# Patient Record
Sex: Female | Born: 1960
Health system: Southern US, Community
[De-identification: ages and names within clinical notes are randomized; demographics above are authoritative.]

## PROBLEM LIST (undated history)

## (undated) DIAGNOSIS — Z923 Personal history of irradiation: Secondary | ICD-10-CM

## (undated) DIAGNOSIS — G4730 Sleep apnea, unspecified: Secondary | ICD-10-CM

## (undated) DIAGNOSIS — D573 Sickle-cell trait: Secondary | ICD-10-CM

## (undated) DIAGNOSIS — C50919 Malignant neoplasm of unspecified site of unspecified female breast: Secondary | ICD-10-CM

## (undated) DIAGNOSIS — E785 Hyperlipidemia, unspecified: Secondary | ICD-10-CM

## (undated) DIAGNOSIS — G43909 Migraine, unspecified, not intractable, without status migrainosus: Secondary | ICD-10-CM

## (undated) DIAGNOSIS — I10 Essential (primary) hypertension: Secondary | ICD-10-CM

## (undated) DIAGNOSIS — M169 Osteoarthritis of hip, unspecified: Secondary | ICD-10-CM

## (undated) DIAGNOSIS — M810 Age-related osteoporosis without current pathological fracture: Secondary | ICD-10-CM

## (undated) DIAGNOSIS — J309 Allergic rhinitis, unspecified: Secondary | ICD-10-CM

## (undated) DIAGNOSIS — E119 Type 2 diabetes mellitus without complications: Secondary | ICD-10-CM

## (undated) DIAGNOSIS — I209 Angina pectoris, unspecified: Secondary | ICD-10-CM

## (undated) DIAGNOSIS — K219 Gastro-esophageal reflux disease without esophagitis: Secondary | ICD-10-CM

## (undated) DIAGNOSIS — D510 Vitamin B12 deficiency anemia due to intrinsic factor deficiency: Secondary | ICD-10-CM

## (undated) DIAGNOSIS — G8929 Other chronic pain: Secondary | ICD-10-CM

## (undated) DIAGNOSIS — I639 Cerebral infarction, unspecified: Secondary | ICD-10-CM

## (undated) DIAGNOSIS — M542 Cervicalgia: Secondary | ICD-10-CM

## (undated) DIAGNOSIS — I219 Acute myocardial infarction, unspecified: Secondary | ICD-10-CM

## (undated) DIAGNOSIS — Z973 Presence of spectacles and contact lenses: Secondary | ICD-10-CM

## (undated) DIAGNOSIS — G709 Myoneural disorder, unspecified: Secondary | ICD-10-CM

## (undated) DIAGNOSIS — Z720 Tobacco use: Secondary | ICD-10-CM

## (undated) DIAGNOSIS — E039 Hypothyroidism, unspecified: Secondary | ICD-10-CM

## (undated) HISTORY — DX: Tobacco use: Z72.0

## (undated) HISTORY — DX: Age-related osteoporosis without current pathological fracture: M81.0

## (undated) HISTORY — DX: Allergic rhinitis, unspecified: J30.9

## (undated) HISTORY — PX: COLONOSCOPY: SHX174

## (undated) HISTORY — DX: Personal history of irradiation: Z92.3

## (undated) HISTORY — DX: Cerebral infarction, unspecified: I63.9

## (undated) HISTORY — DX: Type 2 diabetes mellitus without complications: E11.9

## (undated) HISTORY — DX: Malignant neoplasm of unspecified site of unspecified female breast: C50.919

## (undated) HISTORY — DX: Hyperlipidemia, unspecified: E78.5

## (undated) HISTORY — DX: Essential (primary) hypertension: I10

## (undated) HISTORY — DX: Acute myocardial infarction, unspecified: I21.9

---

## 1988-09-01 HISTORY — PX: NASAL SINUS SURGERY: SHX719

## 1999-05-23 ENCOUNTER — Other Ambulatory Visit: Admission: RE | Admit: 1999-05-23 | Discharge: 1999-05-23 | Payer: Self-pay | Admitting: Otolaryngology

## 1999-05-23 ENCOUNTER — Encounter (INDEPENDENT_AMBULATORY_CARE_PROVIDER_SITE_OTHER): Payer: Self-pay | Admitting: Specialist

## 1999-09-02 DIAGNOSIS — I209 Angina pectoris, unspecified: Secondary | ICD-10-CM

## 1999-09-02 DIAGNOSIS — I219 Acute myocardial infarction, unspecified: Secondary | ICD-10-CM

## 1999-09-02 HISTORY — DX: Angina pectoris, unspecified: I20.9

## 1999-09-02 HISTORY — DX: Acute myocardial infarction, unspecified: I21.9

## 1999-09-02 HISTORY — PX: CORONARY ANGIOPLASTY WITH STENT PLACEMENT: SHX49

## 2000-01-16 ENCOUNTER — Other Ambulatory Visit: Admission: RE | Admit: 2000-01-16 | Discharge: 2000-01-16 | Payer: Self-pay | Admitting: Family Medicine

## 2000-01-16 ENCOUNTER — Encounter: Payer: Self-pay | Admitting: Emergency Medicine

## 2000-01-17 ENCOUNTER — Inpatient Hospital Stay (HOSPITAL_COMMUNITY): Admission: EM | Admit: 2000-01-17 | Discharge: 2000-01-19 | Payer: Self-pay | Admitting: Emergency Medicine

## 2000-02-04 ENCOUNTER — Encounter (HOSPITAL_COMMUNITY): Admission: RE | Admit: 2000-02-04 | Discharge: 2000-02-21 | Payer: Self-pay | Admitting: Cardiology

## 2000-03-09 ENCOUNTER — Encounter (HOSPITAL_COMMUNITY): Admission: RE | Admit: 2000-03-09 | Discharge: 2000-06-07 | Payer: Self-pay | Admitting: Cardiology

## 2002-05-26 ENCOUNTER — Encounter: Payer: Self-pay | Admitting: Family Medicine

## 2002-05-26 ENCOUNTER — Ambulatory Visit (HOSPITAL_COMMUNITY): Admission: RE | Admit: 2002-05-26 | Discharge: 2002-05-26 | Payer: Self-pay | Admitting: Family Medicine

## 2003-03-01 ENCOUNTER — Encounter: Payer: Self-pay | Admitting: Cardiology

## 2003-03-01 ENCOUNTER — Encounter: Payer: Self-pay | Admitting: Emergency Medicine

## 2003-03-02 ENCOUNTER — Inpatient Hospital Stay (HOSPITAL_COMMUNITY): Admission: EM | Admit: 2003-03-02 | Discharge: 2003-03-02 | Payer: Self-pay | Admitting: Emergency Medicine

## 2003-03-29 ENCOUNTER — Encounter: Payer: Self-pay | Admitting: Diagnostic Radiology

## 2003-03-29 ENCOUNTER — Encounter: Admission: RE | Admit: 2003-03-29 | Discharge: 2003-03-29 | Payer: Self-pay | Admitting: Orthopedic Surgery

## 2003-03-29 ENCOUNTER — Encounter: Payer: Self-pay | Admitting: Orthopedic Surgery

## 2004-07-15 ENCOUNTER — Other Ambulatory Visit: Admission: RE | Admit: 2004-07-15 | Discharge: 2004-07-15 | Payer: Self-pay | Admitting: Family Medicine

## 2004-08-01 ENCOUNTER — Encounter: Admission: RE | Admit: 2004-08-01 | Discharge: 2004-08-01 | Payer: Self-pay | Admitting: Orthopedic Surgery

## 2005-09-15 ENCOUNTER — Other Ambulatory Visit: Admission: RE | Admit: 2005-09-15 | Discharge: 2005-09-15 | Payer: Self-pay | Admitting: Family Medicine

## 2005-09-26 ENCOUNTER — Encounter: Admission: RE | Admit: 2005-09-26 | Discharge: 2005-09-26 | Payer: Self-pay | Admitting: Internal Medicine

## 2005-10-16 ENCOUNTER — Encounter: Admission: RE | Admit: 2005-10-16 | Discharge: 2005-10-16 | Payer: Self-pay | Admitting: Orthopedic Surgery

## 2005-10-31 ENCOUNTER — Encounter: Admission: RE | Admit: 2005-10-31 | Discharge: 2005-10-31 | Payer: Self-pay | Admitting: Family Medicine

## 2006-02-28 ENCOUNTER — Encounter: Admission: RE | Admit: 2006-02-28 | Discharge: 2006-02-28 | Payer: Self-pay | Admitting: Orthopedic Surgery

## 2006-03-27 ENCOUNTER — Encounter: Admission: RE | Admit: 2006-03-27 | Discharge: 2006-03-27 | Payer: Self-pay | Admitting: Orthopedic Surgery

## 2006-05-29 ENCOUNTER — Encounter: Admission: RE | Admit: 2006-05-29 | Discharge: 2006-05-29 | Payer: Self-pay | Admitting: Family Medicine

## 2006-07-22 ENCOUNTER — Encounter: Admission: RE | Admit: 2006-07-22 | Discharge: 2006-07-22 | Payer: Self-pay | Admitting: Family Medicine

## 2006-11-22 ENCOUNTER — Emergency Department (HOSPITAL_COMMUNITY): Admission: EM | Admit: 2006-11-22 | Discharge: 2006-11-22 | Payer: Self-pay | Admitting: *Deleted

## 2006-12-28 ENCOUNTER — Ambulatory Visit (HOSPITAL_COMMUNITY): Admission: RE | Admit: 2006-12-28 | Discharge: 2006-12-28 | Payer: Self-pay | Admitting: Family Medicine

## 2007-10-22 ENCOUNTER — Other Ambulatory Visit: Admission: RE | Admit: 2007-10-22 | Discharge: 2007-10-22 | Payer: Self-pay | Admitting: Family Medicine

## 2008-06-16 ENCOUNTER — Encounter: Admission: RE | Admit: 2008-06-16 | Discharge: 2008-06-16 | Payer: Self-pay | Admitting: Family Medicine

## 2008-09-08 ENCOUNTER — Ambulatory Visit (HOSPITAL_COMMUNITY): Admission: RE | Admit: 2008-09-08 | Discharge: 2008-09-08 | Payer: Self-pay | Admitting: Family Medicine

## 2008-11-09 ENCOUNTER — Encounter: Admission: RE | Admit: 2008-11-09 | Discharge: 2008-11-09 | Payer: Self-pay | Admitting: Orthopedic Surgery

## 2008-11-30 ENCOUNTER — Encounter: Admission: RE | Admit: 2008-11-30 | Discharge: 2008-11-30 | Payer: Self-pay | Admitting: Orthopedic Surgery

## 2010-08-01 ENCOUNTER — Other Ambulatory Visit
Admission: RE | Admit: 2010-08-01 | Discharge: 2010-08-01 | Payer: Self-pay | Source: Home / Self Care | Admitting: Family Medicine

## 2010-09-12 ENCOUNTER — Encounter
Admission: RE | Admit: 2010-09-12 | Discharge: 2010-09-12 | Payer: Self-pay | Source: Home / Self Care | Attending: Family Medicine | Admitting: Family Medicine

## 2011-01-17 NOTE — Cardiovascular Report (Signed)
[place]. [facility]  Patient:    Amber [name], Amber [name]                     MRN: [id number] Proc. Date: 02/04/00 Adm. Date:  [id number] Disc. Date: [id number] Attending:  Corliss Marcus CC:         Francisca December, M.D.             Cardiac Catheterization Lab             Dario Guardian, M.D.                        Cardiac Catheterization  CINE NO.:  [id number].  PROCEDURES PERFORMED: 1. Left heart catheterization. 2. Coronary angiography. 3. Left ventriculogram. 4. PTCA/stent -- mid left anterior descending artery.  INDICATIONS:  Ms. Amber [name] is a 50 year old woman who presents with an acute anterior wall myocardial infarction.  She is approximately 2-1/2 hours since the onset of chest pain.  She has received IV nitroglycerin, heparin and aspirin in the emergency room.  She is brought now to the cardiac catheterization laboratory to identify the extent of disease and provide for possible direct PTCA/stent implantation.  PROCEDURAL NOTE:  The patient was brought to the cardiac catheterization laboratory, where the right groin was prepped and draped in the usual sterile fashion.  Local anesthesia was obtained with the infiltration of 1% lidocaine.  A 6-French catheter sheath was introduced percutaneously into the right femoral artery utilizing an anterior approach over a guiding J-wire.  A 6-French #4 left Judkins catheter was then advanced to the ascending aorta, where the pressure was recorded.  The catheter was then used to cannulate the ostium of the left main, and cineangiography performed in multiple LAO and RAO projections.  This catheter was then exchanged for a 6-French #4 right Judkins catheter. Cineangiography of the right coronary artery was conducted in LAO and RAO projections.  The right Judkins catheter was then exchanged for a 110 cm pigtail catheter. This catheter was placed in the left ventricle and the pressure was recorded. A  30-degree RAO cineangiographic left ventriculogram was then performed using a power injector.  Nonionic contrast material of 45 cc was injected at 13 cc/sec.  The pigtail catheter was then exchanged over a long guiding J-wire for a 7-French catheter sheath.  A 7-French FL3.5 SciMed Wise-Guide guiding catheter was advanced into the ascending aorta, where the left coronary ostium was engaged.  A 0.014 SciMed Luge intracoronary guide wire was advanced across a long, tubular 60-70% stenosis in the mid LAD, without difficulty. A 3.0 x 20 mm SciMed Maverick intracoronary balloon was then advanced across the lesion and inflated to 6 atm for two minutes.  The balloon was removed and the stenosis appeared to have worsened and/or stenotic.  It did not respond to IC nitroglycerin.  The balloon was returned to the mid LAD and a second inflation performed at 2 atm for two minutes. After removal of this balloon it was clear there was an intraluminal dissection that was partially obstructive, just proximal to the stenosis at the origin of the first septal perforator.  The decision was then made to proceed with stent implantation.  A 3.0 x 25 mm SciMed NIR Elite intracoronary stent was chosen.  This was placed across the lesion and deployed at a peak pressure of 14 atm.  The stent balloon was removed and an excellent angiographic result was obtained.  It  was confirmed in orthogonal views, and then the guiding wire was removed; as well as the guiding catheter.   The sheath was sutured in place. The patient was transported to the coronary care unit in stable condition, with intact distal pulses.  FLUOROSCOPY TIME:  14.5 minutes.  TOTAL CONTRAST UTILIZED:  50 cc Omnipaque, 300 cc Hexabrix.  HEMODYNAMICS: 1. Systemic arterial pressure:  89/58; mean 73 mmHg.  There was no    systolic gradient across the aortic valve. 2. Left ventricular end-diastolic pressure:  13 mmHg (preventriculogram)    17 mmHg  (postventriculogram)  ANGIOGRAPHY:  LEFT VENTRICULOGRAM:  Demonstrated an anteroapical akinetic wall motion defect.  The overall left ventricular function remained normal (in the 55-60% range).  There was no mitral regurgitation and no significant coronary calcification seen.  CORONARY FINDINGS:  There was a right-dominant coronary system present. 1. LEFT MAIN CORONARY:  Normal. 2. LEFT ANTERIOR DESCENDING ARTERY (and its branches):  Highly diseased.    The vessel contained a mid portion, tubular, approximately 15-18 mm    in length, 60-70% lesion.  It involved some significant tortuosity into    the approach of the lesion.  This was just past the first septal    perforator.  The ongoing LAD was large, transapical and without significant    obstruction.  The lesion itself did not involve either a first or    second diagonal branch (which were moderate-sized and without disease). 3. LEFT CIRCUMFLEX ARTERY (and its branches):  Normal. 4. RIGHT CORONARY ARTERY (and its branches):  Normal.  Following balloon dilatation and stent implantation there was no remaining stenosis seen in the left anterior descending artery.  FINAL IMPRESSION: 1. Atherosclerotic cardiovascular disease, single-vessel. 2. Status post successful PTCA and stent implantation, mid left anterior    descending coronary artery ( ______) . 3. Acute anterior wall myocardial infarction. 4. Normal left ventricular systolic function with regional wall motion    abnormalities noted. DD:  02/04/00 TD:  02/07/00 Job: [id number]

## 2011-01-17 NOTE — Discharge Summary (Signed)
. [facility]  Patient:    Amber [name], Amber [name]                     MRN: [id number] Adm. Date:  [id number] Disc. Date: [id number] Attending:  Corliss Marcus Dictator:   Anselm Lis, N.P. CC:         Dario Guardian, M.D.                           Discharge Summary  PRIMARY CARE Jaclynn Laumann:  Dario Guardian, M.D.  DATE OF BIRTH:  1960-10-26  PROCEDURE:  (February 04, 2000) percutaneous transluminal coronary angioplasty/ stent mid left anterior descending.  DISCHARGE DIAGNOSES/HOSPITAL COURSE: #1 - CORONARY ATHEROSCLEROTIC HEART DISEASE:  Ms. Amber [name] is a 50 year old who presented with acute anterior wall myocardial infarction.  She was initiated on IV nitrates, heparin, and aspirin in the emergency room and was brought urgently to the catheterization lab where she underwent a percutaneous transluminal coronary angioplasty/stent to the mid left anterior descending. Other findings:  Left ventriculogram:  Large anterior apical wall motion abnormality with ejection fraction of 45%.  Coronary angiography:   Right coronary artery and left circumflex normal.  Left anterior descending:  Mid tubular 60% stenosis.  Intervention:  Percutaneous transluminal coronary angioplasty/stent mid left anterior descending.  Complications:  Intimal dissection after ______ necessitating stent implant.  Patient tolerated the procedure well; she had transient chest discomfort post procedure relieved with increasing of her nitrates.  Patient was initiated on and completed IV infusion of Integrilin peri and post procedure.  She was initiated on and tolerated beta blocker and ace inhibitor. She ambulated about well with cardiac rehab and remained pain-free for the rest of hospitalization.  She was discharged on May 20 in stable condition.  Patient ruled in for acute anterior myocardial infarction with peak CK of 995 with MB fraction of 165 and troponin I of 4.12.  Enzymes were  documented to be trending downward during admission.  #2 - ONGOING TOBACCO USE:  Consulted for smoking cessation.  #3 - HYPOKALEMIA:  Serum potassium of 3.4; supplemented.  It was 2.5 on May 19.  #4 - HISTORY OF RECENTLY DIAGNOSED HYPOTHYROIDISM:  She will follow up with primary physician about initiation of supplementation.  PLAN:  Patient discharged home in stable condition.  DISCHARGE MEDICATIONS: 1. Enteric-coated aspirin 325 mg one p.o. q.d. 2. Plavix 75 mg one p.o. q.d. for 28 days. 3. Nitroglycerin 0.4 mg p.r.n. chest pain. 4. Altace 1.25 mg p.o. q.d. 5. Atenolol 25 mg p.o. q.d.  ACTIVITY:  As outlined by cardiac rehab nurse.  DIET:  Low fat, low cholesterol.  ADDITIONAL INSTRUCTIONS:  Patient to follow up with Dr. Merri Brunette for instructions regarding Synthroid initiation.  She was strongly counseled for smoking cessation.  FOLLOW-UP:  Patient will call and make an appointment to be seen by Dr. Amil Amen in approximately one week.  PAST MEDICAL HISTORY: 1. Pernicious anemia for which she gets monthly B12 shots. 2. Chronic GI symptoms; workup including abdominal ultrasound which was normal per patient report as well as abdominal CAT scan which was also negative per patient remembrance. 3. Hypothyroidism; recently diagnosed and not yet supplemented. 4. History of dyslipidemia; recently diagnosed. 5. Ongoing tobacco use, one pack per day for 20 years.  LABORATORY TESTS AND DATA:  Admission CBC revealed WBC elevated at 10.9; 8.4 on subsequent measurement.  Admission hemoglobin 13; 11.8 on subsequent measurement.  Hematocrit 38, platelets 219.  Serum sodium 140, K 3.4; supplemented and was 3.5 on subsequent measurement.  Chloride 108, CO2 23, glucose measuring 111 to 116, BUN 6, creatinine 0.6, calcium 8.2; 8.8 on subsequent measurement.  First CK of 995, with MB fraction of 165.2.  Second CK of 673 with MB fraction of 90.6 and troponin I of 4.12.  Third CK of  449 with MB fraction of 65.2.  Admission chest x-ray revealed clear lungs; no active disease.  EKG revealed no clear P waves; question of accelerated junctional rhythm.  Widened QRS.  ST elevation in V2 through V5.  Repeat EKG revealed QRS narrowing with persistent ST elevation. DD:  02/24/00 TD:  02/24/00 Job: [id number]

## 2011-01-17 NOTE — H&P (Signed)
[place]. [facility]  Patient:    Amber [name], Amber [name]                     MRN: [id number] Adm. Date:  [id number] Attending:  Corliss Marcus Dictator:   Anselm Lis, N.P. CC:         Dario Guardian, M.D.                         History and Physical  SUBJECTIVE:  The patient is a 50 year old female who developed anterior substernal chest pain without radiation at approximately 11:00 p.m. on the evening of admission associated with nausea, diaphoresis and vomiting.  She had no significant shortness of breath.  She called EMS and was administered aspirin and sublingual nitroglycerin.  Her pain was resolving as she arrived in the emergency room.  By the time of Dr. Amil Amen evaluation approximately 30 minutes past midnight, the pain had resolved.  She was begun on IV nitrates and heparin.  The patient had been told earlier on the day of admission at the time of complete physical evaluation that she was hypothyroid; she was begun on Synthroid.  She was also told that she had hypothyroid related dyslipidemia. She had an EKG that was normal.  CARDIAC RISK FACTORS:  Dyslipidemia, positive family history of CAD, positive tobacco use.  PAST MEDICAL HISTORY: 1. Pernicious anemia, for which she gets monthly B12 shots. 2. Chronic GI symptoms; workup including abdominal ultrasound, which was    normal per patient report, as well as abdominal CT, which was also negative    per patient remembrance. 3. Hypothyroidism; recently diagnosed. 4. Dyslipidemia; recently diagnosed. 5. Ongoing tobacco use, one pack per day for 20 years.  SOCIAL HISTORY AND HABITS:  The patient is single.  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  Occasional alcohol use.  Tobacco - one pack per day for 20 years.  She does live alone.  No children.  FAMILY HISTORY:  Her mother is 74 and had a heart attack at the age of 33. Her father health history is unknown.  She has four siblings (two  brothers and two sisters) who are without CAD.  She denies any prior history of cancer, diabetes mellitus, hypertension or stroke.  ALLERGIES:  Acne preparation benzoyl peroxide.  MEDICATIONS: 1. Prevacid 30 mg p.o. q.d. 2. Some sublingual medication (question Levsin) p.r.n. abdominal cramps. 3. Nasonex spray p.r.n. 4. B12 injections every month. 5. FeSO4 "if I can tolerate it." 6. naproxen p.r.n. premenstrual symptoms.  REVIEW OF SYSTEMS:  See HPI and past medical history.  Otherwise, systems review is essentially benign.  She does complain of episodic diarrhea with associated waves of nausea and light headedness, for which she is undergone an urevealing so far GI workup.  She specifically denies problems with DOE, shortness of breath, orthopnea or PND.  Negative pedal edema.  PHYSICAL EXAMINATION:  VITAL SIGNS:  Blood pressure 112/65.  Heart rate 96.  Respiratory rate 18. Temperature 97.7.  Room air saturation 100%.  GENERAL:  Thin, 50 year old black female in no apparent distress.  NECK:  ______ without bruits.  No JVD.  CHEST:  Lung sounds clear with equal bilateral excursion.  CARDIAC:  Regular rhythm without murmur, rub or gallop.  Point of maximal impulse nondisplaced.  ABDOMEN:  Thin, soft, flat and nondistended.  Normoactive bowel sounds. Negative abdominal aorta.  There are no femoral bruits.  No masses.  No organomegaly appreciated.  EXTREMITIES:  Distal pulses intact.  Negative pedal edema.  NEUROLOGIC:  Cranial nerves II-XII intact.  Alert and oriented x 3.  Motor and sensory grossly intact.  GU AND RECTAL:  Deferred.  LABORATORY DATA:  EKG revealed no clear T waves; question of accelerating junctional rhythm.  Widened QRS.  ST elevation in V2 through V5.  Repeat EKG revealed QRS narrowing with persistent ST elevation.  IMPRESSION: 1. Acute anterior myocardial infarction. 2. Recently diagnosed hypothyroidism. 3. Recently diagnosed dyslipidemia, likely  related to hypothyroidism. 4. History of constellation of GI symptoms undergoing evaluation which has    been unrevealing thusfar.  PLAN:  Continue IV nitrates, heparin with the administration of beta blockers; p.r.n. morphine sulfate.  Will transfer promptly to cardiac catheterization lab for coronary angiography and possible percutaneous intervention if indicated and able.  The risks, potential complications, benefits alternatives to the procedure were discussed in detail.  The patient indicated her questions and concerns were address and is agreeable to proceed. DD:  01/17/00 TD:  01/17/00 Job: [id number]

## 2011-05-07 ENCOUNTER — Other Ambulatory Visit: Payer: Self-pay | Admitting: Obstetrics and Gynecology

## 2011-09-02 HISTORY — PX: BREAST LUMPECTOMY: SHX2

## 2011-09-02 HISTORY — PX: BREAST BIOPSY: SHX20

## 2011-10-10 ENCOUNTER — Other Ambulatory Visit: Payer: Self-pay | Admitting: Family Medicine

## 2011-10-10 DIAGNOSIS — Z1231 Encounter for screening mammogram for malignant neoplasm of breast: Secondary | ICD-10-CM

## 2011-10-22 ENCOUNTER — Ambulatory Visit
Admission: RE | Admit: 2011-10-22 | Discharge: 2011-10-22 | Disposition: A | Discharge status: Home / Self Care | Payer: BC Managed Care – PPO | Source: Ambulatory Visit | Attending: Family Medicine | Admitting: Family Medicine

## 2011-10-22 DIAGNOSIS — Z1231 Encounter for screening mammogram for malignant neoplasm of breast: Secondary | ICD-10-CM

## 2012-05-19 ENCOUNTER — Other Ambulatory Visit: Payer: Self-pay | Admitting: Family Medicine

## 2012-05-19 DIAGNOSIS — M545 Low back pain, unspecified: Secondary | ICD-10-CM

## 2012-05-25 ENCOUNTER — Other Ambulatory Visit: Payer: BC Managed Care – PPO

## 2012-05-28 ENCOUNTER — Ambulatory Visit
Admission: RE | Admit: 2012-05-28 | Discharge: 2012-05-28 | Disposition: A | Discharge status: Home / Self Care | Payer: BC Managed Care – PPO | Source: Ambulatory Visit | Attending: Family Medicine | Admitting: Family Medicine

## 2012-05-28 DIAGNOSIS — M545 Low back pain, unspecified: Secondary | ICD-10-CM

## 2012-06-01 HISTORY — PX: BREAST BIOPSY: SHX20

## 2012-06-08 ENCOUNTER — Other Ambulatory Visit: Payer: Self-pay | Admitting: Family Medicine

## 2012-06-08 DIAGNOSIS — N6313 Unspecified lump in the right breast, lower outer quadrant: Secondary | ICD-10-CM

## 2012-06-10 ENCOUNTER — Other Ambulatory Visit: Payer: Self-pay | Admitting: Family Medicine

## 2012-06-10 ENCOUNTER — Ambulatory Visit
Admission: RE | Admit: 2012-06-10 | Discharge: 2012-06-10 | Disposition: A | Discharge status: Home / Self Care | Payer: BC Managed Care – PPO | Source: Ambulatory Visit | Attending: Family Medicine | Admitting: Family Medicine

## 2012-06-10 DIAGNOSIS — N6313 Unspecified lump in the right breast, lower outer quadrant: Secondary | ICD-10-CM

## 2012-06-17 ENCOUNTER — Ambulatory Visit
Admission: RE | Admit: 2012-06-17 | Discharge: 2012-06-17 | Disposition: A | Discharge status: Home / Self Care | Payer: BC Managed Care – PPO | Source: Ambulatory Visit | Attending: Family Medicine | Admitting: Family Medicine

## 2012-06-17 DIAGNOSIS — N6313 Unspecified lump in the right breast, lower outer quadrant: Secondary | ICD-10-CM

## 2012-06-18 ENCOUNTER — Other Ambulatory Visit: Payer: Self-pay | Admitting: Family Medicine

## 2012-06-18 DIAGNOSIS — C50911 Malignant neoplasm of unspecified site of right female breast: Secondary | ICD-10-CM

## 2012-06-21 ENCOUNTER — Encounter (HOSPITAL_COMMUNITY): Payer: Self-pay | Admitting: Pharmacy Technician

## 2012-06-21 ENCOUNTER — Other Ambulatory Visit: Payer: BC Managed Care – PPO

## 2012-06-22 ENCOUNTER — Encounter (INDEPENDENT_AMBULATORY_CARE_PROVIDER_SITE_OTHER): Payer: Self-pay | Admitting: General Surgery

## 2012-06-22 ENCOUNTER — Other Ambulatory Visit (INDEPENDENT_AMBULATORY_CARE_PROVIDER_SITE_OTHER): Payer: Self-pay | Admitting: General Surgery

## 2012-06-22 ENCOUNTER — Ambulatory Visit
Admission: RE | Admit: 2012-06-22 | Discharge: 2012-06-22 | Disposition: A | Discharge status: Home / Self Care | Payer: BC Managed Care – PPO | Source: Ambulatory Visit | Attending: Family Medicine | Admitting: Family Medicine

## 2012-06-22 ENCOUNTER — Ambulatory Visit (INDEPENDENT_AMBULATORY_CARE_PROVIDER_SITE_OTHER): Payer: BC Managed Care – PPO | Admitting: General Surgery

## 2012-06-22 VITALS — BP 126/80 | HR 66 | Temp 97.9°F | Resp 18 | Ht 65.0 in | Wt 140.8 lb

## 2012-06-22 DIAGNOSIS — C50319 Malignant neoplasm of lower-inner quadrant of unspecified female breast: Secondary | ICD-10-CM

## 2012-06-22 DIAGNOSIS — C50911 Malignant neoplasm of unspecified site of right female breast: Secondary | ICD-10-CM

## 2012-06-22 NOTE — Patient Instructions (Signed)
To have been diagnosed with a small invasive ductal carcinoma of the right breast at the 5:00 position immediately under the areola. If you wish to preserve your breast, you will need to have a central lumpectomy which will remove the nipple and areola, and we will need to do a right axillary sentinel lymph node biopsy.  You are scheduled for an MRI tomorrow. We should have those results by Thursday.  You are to call Dr. Margreta Journey to discuss your upcoming hip surgery in light of the new diagnosis of breast cancer. He will need to decide whether to cancel that surgery or not.  You will be referred to one of the medical oncologists who specializes is in breast cancer.  We will try to discuss her case next Wednesday at our breast conference.  Prior to breast surgery he will need to have cardiac clearance with Dr. Eldridge Dace.  Please call Dr. Derrell Lolling back after you complete a consultation with the medical oncologist. At that time we will be able to begin to scheduling process for your breast surgery.     Lumpectomy, Breast Conserving Surgery A lumpectomy is breast surgery that removes only part of the breast. Another name used may be partial mastectomy. The amount removed varies. Make sure you understand how much of your breast will be removed. Reasons for a lumpectomy:  Any solid breast mass.  Grouped significant nodularity that may be confused with a solitary breast mass. Lumpectomy is the most common form of breast cancer surgery today. The surgeon removes the portion of your breast which contains the tumor (cancer). This is the lump. Some normal tissue around the lump is also removed to be sure that all the tumor has been removed.  If cancer cells are found in the margins where the breast tissue was removed, your surgeon will do more surgery to remove the remaining cancer tissue. This is called re-excision surgery. Radiation and/or chemotherapy treatments are often given following a lumpectomy  to kill any cancer cells that could possibly remain.  REASONS YOU MAY NOT BE ABLE TO HAVE BREAST CONSERVING SURGERY:  The tumor is located in more than one place.  Your breast is small and the tumor is large so the breast would be disfigured.  The entire tumor removal is not successful with a lumpectomy.  You cannot commit to a full course of chemotherapy, radiation therapy or are pregnant and cannot have radiation.  You have previously had radiation to the breast to treat cancer. HOW A LUMPECTOMY IS PERFORMED If overnight nursing is not required following a biopsy, a lumpectomy can be performed as a same-day surgery. This can be done in a hospital, clinic, or surgical center. The anesthesia used will depend on your surgeon. They will discuss this with you. A general anesthetic keeps you sleeping through the procedure. LET YOUR CAREGIVERS KNOW ABOUT THE FOLLOWING:  Allergies  Medications taken including herbs, eye drops, over the counter medications, and creams.  Use of steroids (by mouth or creams)  Previous problems with anesthetics or Novocaine.  Possibility of pregnancy, if this applies  History of blood clots (thrombophlebitis)  History of bleeding or blood problems.  Previous surgery  Other health problems BEFORE THE PROCEDURE You should be present one hour prior to your procedure unless directed otherwise.  AFTER THE PROCEDURE  After surgery, you will be taken to the recovery area where a nurse will watch and check your progress. Once you're awake, stable, and taking fluids well, barring other problems you  will be allowed to go home.  Ice packs applied to your operative site may help with discomfort and keep the swelling down.  A small rubber drain may be placed in the breast for a couple of days to prevent a hematoma from developing in the breast.  A pressure dressing may be applied for 24 to 48 hours to prevent bleeding.  Keep the wound dry.  You may resume a  normal diet and activities as directed. Avoid strenuous activities affecting the arm on the side of the biopsy site such as tennis, swimming, heavy lifting (more than 10 pounds) or pulling.  Bruising in the breast is normal following this procedure.  Wearing a bra - even to bed - may be more comfortable and also help keep the dressing on.  Change dressings as directed.  Only take over-the-counter or prescription medicines for pain, discomfort, or fever as directed by your caregiver. Call for your results as instructed by your surgeon. Remember it is your responsibility to get the results of your lumpectomy if your surgeon asked you to follow-up. Do not assume everything is fine if you have not heard from your caregiver. SEEK MEDICAL CARE IF:   There is increased bleeding (more than a small spot) from the wound.  You notice redness, swelling, or increasing pain in the wound.  Pus is coming from wound.  An unexplained oral temperature above 102 F (38.9 C) develops.  You notice a foul smell coming from the wound or dressing. SEEK IMMEDIATE MEDICAL CARE IF:   You develop a rash.  You have difficulty breathing.  You have any allergic problems. Document Released: 09/29/2006 Document Revised: 11/10/2011 Document Reviewed: 12/31/2006 [facility] Patient Information 2013 [place], [place].

## 2012-06-22 NOTE — Progress Notes (Addendum)
Patient ID: Amber [name], female   DOB: [birth year], 51 y.o.   MRN: [id number]  Chief Complaint  Patient presents with  . New Evaluation    Breast ca ( right)    HPI Amber [name] is a 51 y.o. female.  She is referred by Dr. Britta Mccreedy at the breast center [facility] for evaluation and management of a newly diagnosed invasive carcinoma of the right breast at the 5:00 position, subareolar area. Dr. Merri Brunette is her primary care physician. Dr. Everette Rank is her cardiologist . Dr. Margreta Journey is her orthopedist who is planning hip surgery on November 1.  The patient has had almost no breast problems in the past. She had one needle aspiration years ago. Mammograms last year were normal. One month ago she felt a lump in her right breast at the areolar margins at the 5:00 position. She saw Dr. Katrinka Blazing who referred her for further imaging. She underwent diagnostic mammograms and ultrasound of the right breast. Findings were a 5 mm palpable mass at the 5:00 position thought to represent her cancer and a subareolar sebaceous cyst in the same area, also no bigger than 5 mm. I reviewed these films with Dr. Hulan Saas today and these are two distinctly separate areas but they are very close to each other.  Image guided biopsy of the palpable mass shows invasive duct carcinoma mixed with DCIS. Breast diagnostic profile pending.  MRI is scheduled for tomorrow.  The patient is concerned because she is scheduled for a right total hip replacement on November 1 and is not sure whether she wants to cancel that or not.  We had a long talk about the management of her breast cancer. We talked about the need for central lumpectomy and sentinel node biopsy. We also talked about mastectomy being another standard of care. She is more interested in breast conservation and understands and accepts that she will lose her nipple and areola with a lumpectomy. We talked about wire localization. We talked about risks  and complications including bleeding infection cosmetic deformity and reoperation for positive nodes or positive margins.  We talked about involvement of the medical oncologist and she would like to have a consultation with the medical oncologist preop. We will be arranging that.  Past history reveals myocardial infarction 2001 but no symptoms currently. Pernicious anemia takes vitamin B 12. Hypertension. Hyperlipidemia. Sinus surgery. Upcoming right total hip replacement.  She does not take any estrogen that she knows of but she takes some kind of medicine for hot flashes and she's going to check the components of that. She is advised  to stop all hormone replacement therapy.  Family history is negative for breast cancer, ovarian cancer, or panic or prostate cancer. She did have a cousin with pancreatic cancer and uncle with a brain cancer. HPI  Past Medical History  Diagnosis Date  . Heart attack 2001  . Anemia   . Arthritis   . Hypertension   . Hyperlipidemia   . Osteoporosis   . Thyroid disease     Past Surgical History  Procedure Date  . Nasal sinus surgery 1990    Family History  Problem Relation Age of Onset  . Diabetes Mother   . Heart disease Mother   . Cancer Maternal Grandmother     pancreatic  . Cancer Cousin     pancreatic    Social History History  Substance Use Topics  . Smoking status: Current Some Day Smoker -- 0.5 packs/day  .  Smokeless tobacco: Never Used   Comment: half a/pk QD  for 20 yrs  . Alcohol Use: Yes     2 drinks per week    Allergies  Allergen Reactions  . Tramadol     Stomach upset    Current Outpatient Prescriptions  Medication Sig Dispense Refill  . alendronate (FOSAMAX) 70 MG tablet Take 70 mg by mouth every 7 (seven) days. Take with a full glass of water on an empty stomach. Saturday      . amoxicillin (AMOXIL) 500 MG capsule       . aspirin EC 81 MG tablet Take 81 mg by mouth at bedtime.      Marland Kitchen atenolol (TENORMIN) 25 MG  tablet Take 25 mg by mouth daily.      Marland Kitchen atorvastatin (LIPITOR) 80 MG tablet Take 80 mg by mouth at bedtime.      Marland Kitchen BIOTIN PO Take 2 tablets by mouth daily.      . Cholecalciferol (VITAMIN D PO) Take 1 tablet by mouth daily.      . Coenzyme Q10 (COQ10 PO) Take 1 tablet by mouth daily.      . cyanocobalamin (,VITAMIN B-12,) 1000 MCG/ML injection Inject 1,000 mcg into the muscle every 30 (thirty) days. As needed when feeling tired      . ezetimibe (ZETIA) 10 MG tablet Take 10 mg by mouth daily.      Marland Kitchen GLUCOSAMINE-CHONDROITIN PO Take 1 tablet by mouth daily.      . IRON PO Take 1 tablet by mouth at bedtime as needed. When feeling tired      . levothyroxine (SYNTHROID, LEVOTHROID) 112 MCG tablet Take 112 mcg by mouth every morning.      Marland Kitchen lisinopril (PRINIVIL,ZESTRIL) 5 MG tablet Take 5 mg by mouth daily.      . meloxicam (MOBIC) 15 MG tablet       . mometasone (NASONEX) 50 MCG/ACT nasal spray Place 2 sprays into the nose daily as needed. For allergies      . Omega-3 Fatty Acids (FISH OIL PO) Take 1 capsule by mouth daily.        Review of Systems Review of Systems  Constitutional: Negative for fever, chills and unexpected weight change.  HENT: Negative for hearing loss, congestion, sore throat, trouble swallowing and voice change.   Eyes: Negative for visual disturbance.  Respiratory: Negative for cough and wheezing.   Cardiovascular: Negative for chest pain, palpitations and leg swelling.  Gastrointestinal: Negative for nausea, vomiting, abdominal pain, diarrhea, constipation, blood in stool, abdominal distention and anal bleeding.  Genitourinary: Negative for hematuria, vaginal bleeding and difficulty urinating.  Musculoskeletal: Positive for arthralgias.  Skin: Negative for rash and wound.  Neurological: Negative for seizures, syncope and headaches.  Hematological: Negative for adenopathy. Does not bruise/bleed easily.  Psychiatric/Behavioral: Negative for confusion.    Blood pressure  126/80, pulse 66, temperature 97.9 F (36.6 C), temperature source Oral, resp. rate 18, height 5\' 5"  (1.651 m), weight 140 lb 12.8 oz (63.866 kg).  Physical Exam Physical Exam  Constitutional: She is oriented to person, place, and time. She appears well-developed and well-nourished. No distress.  HENT:  Head: Normocephalic and atraumatic.  Nose: Nose normal.  Mouth/Throat: No oropharyngeal exudate.  Eyes: Conjunctivae normal and EOM are normal. Pupils are equal, round, and reactive to light. Left eye exhibits no discharge. No scleral icterus.  Neck: Neck supple. No JVD present. No tracheal deviation present. No thyromegaly present.  Cardiovascular: Normal rate, regular rhythm, normal  heart sounds and intact distal pulses.   No murmur heard. Pulmonary/Chest: Effort normal and breath sounds normal. No respiratory distress. She has no wheezes. She has no rales. She exhibits no tenderness.       Breasts are medium to medium large in size. In the right breast there is a small palpable mass just under the areolar margin at the 5:00 position. This is consistent with her biopsy cancer. I don't really feel a sebaceous cyst although there may be a small nodule just toward the 6 oclock position at the areolar margin. No other mass in either breast. No other skin changes. No axillary adenopathy.  Abdominal: Soft. Bowel sounds are normal. She exhibits no distension and no mass. There is no tenderness. There is no rebound and no guarding.  Musculoskeletal: She exhibits no edema and no tenderness.  Lymphadenopathy:    She has no cervical adenopathy.  Neurological: She is alert and oriented to person, place, and time. She exhibits normal muscle tone. Coordination normal.  Skin: Skin is warm. No rash noted. She is not diaphoretic. No erythema. No pallor.  Psychiatric: She has a normal mood and affect. Her behavior is normal. Judgment and thought content normal.    Data Reviewed Imaging studies. Pathology  report.  Assessment    5 mm invasive ductal carcinoma right breast, subareolar area, 5:00 position, palpable. Breast diagnostic profile pending. Clinical stage TIB, N0.  Patient expressed a desire for breast conservation  History of myocardial infarction 2001. Asymptomatic. Followed by cardiology  Pernicious anemia  Hypertension  Upcoming right total hip replacement  Negative family history for breast or ovarian cancer    Plan    We had a very long and very productive discussion with her and one of her friends.  MRI scheduled for tomorrow  Patient is going to call Dr. Margreta Journey tomorrow to discuss whether or not to cancel her right total hip replacement. She understands that the breast cancer is a more threatening disease process than the hip.  She'll be referred to one of the breast oncologists as soon as possible  She will be presented at breast conference next Wednesday  She will need cardiac clearance prior to any breast surgery  Unless we find multifocal or bilateral disease, she should be a good candidate for a central lumpectomy with needle localization and right axillary sentinel lymph node biopsy. We've talked about the indications details techniques and risks of the surgery. She understands all these issues. All her questions are answered.  Ultimately, she will call me back after she meets with the oncologist to initiate planning and scheduling of her right breast surgery.  ADDENDUM:  (07-01-2012) - MRI shows a small enhancing mass in the right breast, subareolar area. This is a solitary finding. There are no other abnormalities and no adenopathy. She has seen Dr. Welton Flakes. She will be referred to Dr. Michell Heinrich for radiation oncology consultation. She is ready to schedule her surgery and we are proceeding with scheduling.       Angelia Mould. Derrell Lolling, M.D., [facility] General and Minimally invasive Surgery Breast and Colorectal Surgery Office:    [phone] Pager:   [phone]  06/22/2012, 3:16 PM

## 2012-06-23 ENCOUNTER — Telehealth: Payer: Self-pay | Admitting: *Deleted

## 2012-06-23 ENCOUNTER — Ambulatory Visit
Admission: RE | Admit: 2012-06-23 | Discharge: 2012-06-23 | Disposition: A | Discharge status: Home / Self Care | Payer: BC Managed Care – PPO | Source: Ambulatory Visit | Attending: Family Medicine | Admitting: Family Medicine

## 2012-06-23 ENCOUNTER — Telehealth (INDEPENDENT_AMBULATORY_CARE_PROVIDER_SITE_OTHER): Payer: Self-pay | Admitting: General Surgery

## 2012-06-23 DIAGNOSIS — C50911 Malignant neoplasm of unspecified site of right female breast: Secondary | ICD-10-CM

## 2012-06-23 MED ORDER — GADOBENATE DIMEGLUMINE 529 MG/ML IV SOLN
13.0000 mL | Freq: Once | INTRAVENOUS | Status: AC | PRN
  Administered 2012-06-23: 13 mL via INTRAVENOUS

## 2012-06-23 NOTE — Telephone Encounter (Signed)
Confirmed 06/30/12 appt w/ pt.   Mailed before appt letter & packet to pt.  Emailed Annabelle Harman to make aware.  Took paperwork to Med [facility] for chart.

## 2012-06-23 NOTE — Telephone Encounter (Signed)
Patient called in stating that she was advising of the dosages for the Remifemin she takes over the counter. It contains Black Cohosh 20 mg and Estrogen 3. Advised patient that coordination will occur with Dr. Derrell Lolling and oncology, both offices will contact her regarding future appointments. Patient agreed.

## 2012-06-28 ENCOUNTER — Other Ambulatory Visit: Payer: Self-pay | Admitting: *Deleted

## 2012-06-28 DIAGNOSIS — C50319 Malignant neoplasm of lower-inner quadrant of unspecified female breast: Secondary | ICD-10-CM

## 2012-06-29 ENCOUNTER — Other Ambulatory Visit (HOSPITAL_COMMUNITY): Payer: BC Managed Care – PPO

## 2012-06-30 ENCOUNTER — Ambulatory Visit: Payer: BC Managed Care – PPO

## 2012-06-30 ENCOUNTER — Encounter: Payer: Self-pay | Admitting: Oncology

## 2012-06-30 ENCOUNTER — Telehealth: Payer: Self-pay | Admitting: *Deleted

## 2012-06-30 ENCOUNTER — Telehealth (INDEPENDENT_AMBULATORY_CARE_PROVIDER_SITE_OTHER): Payer: Self-pay | Admitting: General Surgery

## 2012-06-30 ENCOUNTER — Other Ambulatory Visit (HOSPITAL_BASED_OUTPATIENT_CLINIC_OR_DEPARTMENT_OTHER): Payer: BC Managed Care – PPO | Admitting: Lab

## 2012-06-30 ENCOUNTER — Ambulatory Visit (HOSPITAL_BASED_OUTPATIENT_CLINIC_OR_DEPARTMENT_OTHER): Payer: BC Managed Care – PPO | Admitting: Oncology

## 2012-06-30 VITALS — BP 115/70 | HR 69 | Temp 98.4°F | Resp 20 | Ht 65.0 in | Wt 142.3 lb

## 2012-06-30 DIAGNOSIS — Z170 Estrogen receptor positive status [ER+]: Secondary | ICD-10-CM

## 2012-06-30 DIAGNOSIS — C50319 Malignant neoplasm of lower-inner quadrant of unspecified female breast: Secondary | ICD-10-CM

## 2012-06-30 LAB — CBC WITH DIFFERENTIAL/PLATELET
Basophils Absolute: 0 10*3/uL (ref 0.0–0.1)
EOS%: 4.5 % (ref 0.0–7.0)
HCT: 41 % (ref 34.8–46.6)
HGB: 13.8 g/dL (ref 11.6–15.9)
LYMPH%: 28.1 % (ref 14.0–49.7)
MCH: 30.4 pg (ref 25.1–34.0)
MCHC: 33.6 g/dL (ref 31.5–36.0)
MONO#: 0.8 10*3/uL (ref 0.1–0.9)
NEUT%: 59 % (ref 38.4–76.8)
Platelets: 227 10*3/uL (ref 145–400)
lymph#: 2.9 10*3/uL (ref 0.9–3.3)

## 2012-06-30 LAB — COMPREHENSIVE METABOLIC PANEL (CC13)
BUN: 11 mg/dL (ref 7.0–26.0)
CO2: 28 mEq/L (ref 22–29)
Calcium: 10.5 mg/dL — ABNORMAL HIGH (ref 8.4–10.4)
Chloride: 106 mEq/L (ref 98–107)
Creatinine: 0.8 mg/dL (ref 0.6–1.1)
Total Bilirubin: 0.26 mg/dL (ref 0.20–1.20)

## 2012-06-30 LAB — CANCER ANTIGEN 27.29: CA 27.29: 23 U/mL (ref 0–39)

## 2012-06-30 NOTE — Progress Notes (Signed)
Amber [name] [id number] 02-24-1961 51 y.o. 06/30/2012 3:17 PM  CC  Allean Found, MD [address] [place] [address] Dr. Claud Kelp Dr. Dion Body Dr. Lurline Idol  REASON FOR CONSULTATION:  51 year old female with new diagnosis of breast cancer  STAGE:   No matching staging information was found for the patient.  REFERRING PHYSICIAN: Dr. Claud Kelp  HISTORY OF PRESENT ILLNESS:  Amber [name] is a 51 y.o. female.  Patient presented with a right breast lump that she examined one month ago in the areolar margins at the 5:00 position. She went on to have a diagnostic mammogram and ultrasound performed the findings are consistent with a 5 mm palpable mass at the 5:00 position. She subsequently had a biopsy that showed invasive ductal carcinoma with DCIS. The tumor was ER +100% PR +60% proliferation marker KI 67 6% and HER-2/neu negative. The tumor was felt to be a grade 1. She was seen by Dr. Claud Kelp. She also had an MRI of the breasts performed on 06/23/2012 the MRI showed 5 x 4 x 5 mm area in the right breast at the 5:00 position. No other findings were noted a slight there was slight focal skin thickening and skin edema directly over the mass. She is now seen in medical oncology for discussion of treatment options. She is without any complaints.   Past Medical History: Past Medical History  Diagnosis Date  . Heart attack 2001  . Anemia   . Arthritis   . Hypertension   . Hyperlipidemia   . Osteoporosis   . Thyroid disease   . Breast cancer     Past Surgical History: Past Surgical History  Procedure Date  . Nasal sinus surgery 1990    Family History: Family History  Problem Relation Age of Onset  . Diabetes Mother   . Heart disease Mother   . Cancer Maternal Grandmother     pancreatic  . Cancer Cousin     pancreatic    Social History History  Substance Use Topics  . Smoking status: Current Some Day Smoker -- 0.5 packs/day  .  Smokeless tobacco: Never Used   Comment: half a/pk QD  for 20 yrs  . Alcohol Use: Yes     2 drinks per week    Allergies: Allergies  Allergen Reactions  . Tramadol     Stomach upset    Current Medications: Current Outpatient Prescriptions  Medication Sig Dispense Refill  . alendronate (FOSAMAX) 70 MG tablet Take 70 mg by mouth every 7 (seven) days. Take with a full glass of water on an empty stomach. Saturday      . aspirin EC 81 MG tablet Take 81 mg by mouth at bedtime.      Marland Kitchen atenolol (TENORMIN) 25 MG tablet Take 25 mg by mouth daily.      Marland Kitchen atorvastatin (LIPITOR) 80 MG tablet Take 80 mg by mouth at bedtime.      Marland Kitchen BIOTIN PO Take 2 tablets by mouth daily.      . Cholecalciferol (VITAMIN D PO) Take 1 tablet by mouth daily.      . Coenzyme Q10 (COQ10 PO) Take 1 tablet by mouth daily.      . cyanocobalamin (,VITAMIN B-12,) 1000 MCG/ML injection Inject 1,000 mcg into the muscle every 30 (thirty) days. As needed when feeling tired      . ezetimibe (ZETIA) 10 MG tablet Take 10 mg by mouth daily.      Marland Kitchen GLUCOSAMINE-CHONDROITIN PO Take  1 tablet by mouth daily.      . IRON PO Take 1 tablet by mouth at bedtime as needed. When feeling tired      . levothyroxine (SYNTHROID, LEVOTHROID) 112 MCG tablet Take 112 mcg by mouth every morning.      Marland Kitchen lisinopril (PRINIVIL,ZESTRIL) 5 MG tablet Take 5 mg by mouth daily.      . meloxicam (MOBIC) 15 MG tablet       . mometasone (NASONEX) 50 MCG/ACT nasal spray Place 2 sprays into the nose daily as needed. For allergies      . Omega-3 Fatty Acids (FISH OIL PO) Take 1 capsule by mouth daily.        OB/GYN History:Menarche at 9, menopause at the age of 51, no HRT, G0P0, nulliparous,   Fertility Discussion:N/A Prior History of Cancer: no prior cancers  Health Maintenance:  Colonoscopy 2011 Bone Density longer than 5 years Last PAP smear 2012-  ECOG PERFORMANCE STATUS: 0 - Asymptomatic  Genetic Counseling/testing: None recommended  REVIEW OF  SYSTEMS:  A comprehensive review of systems was negative.  PHYSICAL EXAMINATION: Blood pressure 115/70, pulse 69, temperature 98.4 F (36.9 C), temperature source Oral, resp. rate 20, height 5\' 5"  (1.651 m), weight 142 lb 4.8 oz (64.547 kg).  AOZ:HYQMV, healthy and no distress SKIN: skin color, texture, turgor are normal HEAD: Normocephalic EYES: PERRLA, EOMI EARS: External ears normal OROPHARYNX:no exudate and no erythema  NECK: supple, no adenopathy LYMPH:  no palpable lymphadenopathy BREAST: Right breast reveals a small palpable nodule at the 5:00 position no other nodularity noted no nipple discharge or retraction left breast no masses or nipple discharge. LUNGS: clear to auscultation and percussion HEART: regular rate & rhythm ABDOMEN:abdomen soft, non-tender, obese, normal bowel sounds and no masses or organomegaly BACK: No CVA tenderness EXTREMITIES:no edema, no clubbing, no cyanosis  NEURO: alert & oriented x 3 with fluent speech, no focal motor/sensory deficits     STUDIES/RESULTS: US Breast Right  06/24/2012  **ADDENDUM** CREATED: 06/24/2012 11:34:41  correction to initial report:  the palpable mass is in the right breast, and ultrasound findings are also made in the right breast, not the left.  Addended by:  Esperanza Heir, M.D. on 06/24/2012 11:34:41.  **END ADDENDUM** SIGNED BY: Esperanza Heir, M.D.   06/10/2012  *RADIOLOGY REPORT*  Clinical Data:  mass 5 o'clock periareolar area on the right  DIGITAL DIAGNOSTIC RIGHT MAMMOGRAM WITH CAD AND RIGHT BREAST ULTRASOUND:  Comparison:  10/22/2011, 09/12/2010  Findings:  Breast tissue is heterogeneously dense.  Immediately deep to the BB placed on the skin of the areola in the palpable area, there is mild sub dermal architectural distortion.  There are no other significant findings or change from prior studies. Mammographic images were processed with CAD.  On physical exam, there is a palpable 5 mm focus on the left breast in  the periareolar 5 o'clock position.  Ultrasound is performed, showing a sebaceous cyst in the skin of the areola in the 5 o'clock position measuring 5 x 2 mm.  1 cm more removed from the nipple in the 5 o'clock position however, there is a 5 x 4 x 5 mm oval circumscribed hypoechoic mass demonstrating strong posterior acoustic shadowing.  This corresponds to the palpable area.  IMPRESSION: Small shadowing mass associated with architectural distortion, palpable, in the 5 o'clock position of the left breast.  BI-RADS CATEGORY 4:  Suspicious abnormality - biopsy should be considered.  RECOMMENDATION: The patient has scheduled a return  appointment for ultrasound- guided core needle biopsy.   Original Report Authenticated By: Otilio Carpen, M.D.    Mr Breast Bilateral W Wo Contrast  06/23/2012  *RADIOLOGY REPORT*  Clinical Data: Recent diagnosis of invasive ductal carcinoma and DCIS, grade 1, following ultrasound-guided biopsy of a 5 mm subareolar mass at 5 o'clock position in the right breast. The patient initially presented with a palpable mass.  BUN and creatinine were obtained on site at [facility] at [address] Results:  BUN 6 mg/dL,  Creatinine 0.9 mg/dL.  BILATERAL BREAST MRI WITH AND WITHOUT CONTRAST  Technique: Multiplanar, multisequence MR images of both breasts were obtained prior to and following the intravenous administration of 13ml of Multihance.  Three dimensional images were evaluated at the independent DynaCad workstation.  Comparison:  Diagnostic right mammogram right breast ultrasound 06/10/2012, right breast biopsy and post clip mammogram 06/17/2012, and mammogram of the left breast 06/22/2012.  Findings: There is a mild background parenchymal enhancement pattern bilaterally.  In the subareolar right breast, five o'clock position, is a superficially positioned small irregularly shaped mass with central biopsy clip artifact and some surrounding edema. The biopsy-proven  malignancy measures approximately 5 x 4 x 5 mm. No additional suspicious areas of enhancement are identified in the right breast.  There is slight focal skin thickening and skin edema directly overlying the mass.  The previously described focal dermal lesion at the time ultrasound on 06/10/2012 within the areola at 5 o'clock position of the right breast is not discretely detected on MRI.  Question if this sonographically detected  mass could be within the area of focal skin thickening seen on MRI.  No mass or suspicious enhancement is identified in the left breast to suggest malignancy.  Negative for axillary or internal mammary chain lymphadenopathy.  IMPRESSION:  1.  Solitary 5 mm enhancing mass in the subareolar right breast five o'clock position corresponds to the biopsy-proven malignancy. There is focal skin thickening directly overlying the mass. 2.  No evidence of malignancy in the left breast. 3.  Negative for lymphadenopathy.  RECOMMENDATION: Surgical planning  THREE-DIMENSIONAL MR IMAGE RENDERING ON INDEPENDENT WORKSTATION:  Three-dimensional MR images were rendered by post-processing of the original MR data on an independent workstation.  The three- dimensional MR images were interpreted, and findings were reported in the accompanying complete MRI report for this study.  BI-RADS CATEGORY 6:  Known biopsy-proven malignancy - appropriate action should be taken.   Original Report Authenticated By: Britta Mccreedy, M.D.    [place] Core Biopsy  06/23/2012  **ADDENDUM** CREATED: 06/23/2012 14:30:29  This addendum is to correct a left/right error in the initial pathology addendum. The  The biopsy was performed of the RIGHT breast.  **END ADDENDUM** SIGNED BY: Britta Mccreedy, M.D.   06/18/2012  **ADDENDUM** CREATED: 06/18/2012 13:42:28  Pathology results of a left breast biopsy (5 mm mass at five o'clock position subareolar) show invasive ductal carcinoma and DCIS grade 1.  Pathology results are concordant with  imaging findings.  I discussed with the patient by telephone today per pathology results.  Her questions were answered.  Surgical consultation is scheduled with Dr. Derrell Lolling on 06/22/2012 at 1:45 p.m.  Breast MRI will be arranged by our office for the patient's convenience, as the initial appointment time we scheduled for her conflicted with a previously scheduled appointment.  She reports some soreness at the biopsy site, but is otherwise doing well.  She reports no bleeding or bruising.  Given that the 5  mm mass in the superficial aspect of the subareolar left breast at 5 o'clock position is a malignancy, it should be considered to excise a separate intradermal mass seen at 5 o'clock position of the areola at the time of the patient's original right breast ultrasound on 06/10/2012.  Although this intradermal lesion may be a benign sebaceous cyst, given its proximity to the biopsy-proven cancer, excision may be useful for pathologic evaluation.  **END ADDENDUM** SIGNED BY: Britta Mccreedy, M.D.   06/17/2012  *RADIOLOGY REPORT*  Clinical Data:  Suspicious superficially positioned mass subareolar right breast at 5 o'clock position.  ULTRASOUND GUIDED CORE BIOPSY OF THE at the right BREAST  Comparison: Previous exams.  I met with the patient and we discussed the procedure of ultrasound- guided biopsy, including benefits and alternatives.  We discussed the high likelihood of a successful procedure. We discussed the risks of the procedure, including infection, bleeding, tissue injury, clip migration, and inadequate sampling.  Informed written consent was given.  Using sterile technique 2% lidocaine, ultrasound guidance and a 14 gauge automated biopsy device, biopsy was performed of a 5 mm spiculated mass using a lateral to medial approach.  At the conclusion of the procedure a wing shaped tissue marker clip was deployed into the biopsy cavity.  Follow up 2 view mammogram was performed and dictated separately.  IMPRESSION:  Ultrasound guided biopsy of subareolar right breast mass 5 o'clock position.  No apparent complications.  Original Report Authenticated By: Britta Mccreedy, M.D.    Mm Digital Diagnostic Unilat L  06/22/2012  *RADIOLOGY REPORT*  Clinical Data:  Recent diagnosis of right breast cancer.  Pre breast MRI left mammogram.  DIGITAL DIAGNOSTIC LEFT MAMMOGRAM WITH CAD  Comparison:  June 17, 2012, June 11, 2012, October 22, 2011, September 12, 2010  Findings:  CC and MLO views of the left breast and spot compression CC and MLO views of the left breast are submitted.  There is questioned asymmetry in the left breast which does not persist on spot compression views. Mammographic images were processed with CAD.  IMPRESSION: Negative in the left breast.  Known right breast cancer.  RECOMMENDATION: Surgical consultation and MRI of the breasts.  I have discussed the findings and recommendations with the patient. Results were also provided in writing at the conclusion of the visit.  BI-RADS CATEGORY 6:  Known biopsy-proven malignancy - appropriate action should be taken.   Original Report Authenticated By: Sherian Rein, M.D.    Mm Digital Diagnostic Unilat R  06/17/2012  *RADIOLOGY REPORT*  Clinical Data:  Biopsy was performed of a suspicious mass 5 o'clock right breast subareolar.  DIGITAL DIAGNOSTIC RIGHT MAMMOGRAM  Comparison:  Previous exams.  Findings:  Films are performed following ultrasound guided biopsy of a 5 mm hypoechoic spiculated mass 5 o'clock position right breast subareolar.  The biopsy clip is satisfactorily positioned within the mass.  IMPRESSION: Satisfactory position of biopsy clip in the subareolar right breast.   Original Report Authenticated By: Britta Mccreedy, M.D.    Mm Digital Diagnostic Unilat R  06/24/2012  **ADDENDUM** CREATED: 06/24/2012 11:34:41  correction to initial report:  the palpable mass is in the right breast, and ultrasound findings are also made in the right breast, not the left.   Addended by:  Esperanza Heir, M.D. on 06/24/2012 11:34:41.  **END ADDENDUM** SIGNED BY: Esperanza Heir, M.D.   06/10/2012  *RADIOLOGY REPORT*  Clinical Data:  mass 5 o'clock periareolar area on the right  DIGITAL DIAGNOSTIC RIGHT MAMMOGRAM  WITH CAD AND RIGHT BREAST ULTRASOUND:  Comparison:  10/22/2011, 09/12/2010  Findings:  Breast tissue is heterogeneously dense.  Immediately deep to the BB placed on the skin of the areola in the palpable area, there is mild sub dermal architectural distortion.  There are no other significant findings or change from prior studies. Mammographic images were processed with CAD.  On physical exam, there is a palpable 5 mm focus on the left breast in the periareolar 5 o'clock position.  Ultrasound is performed, showing a sebaceous cyst in the skin of the areola in the 5 o'clock position measuring 5 x 2 mm.  1 cm more removed from the nipple in the 5 o'clock position however, there is a 5 x 4 x 5 mm oval circumscribed hypoechoic mass demonstrating strong posterior acoustic shadowing.  This corresponds to the palpable area.  IMPRESSION: Small shadowing mass associated with architectural distortion, palpable, in the 5 o'clock position of the left breast.  BI-RADS CATEGORY 4:  Suspicious abnormality - biopsy should be considered.  RECOMMENDATION: The patient has scheduled a return appointment for ultrasound- guided core needle biopsy.   Original Report Authenticated By: Otilio Carpen, M.D.      LABS:    Chemistry   No results found for this basename: NA, K, CL, CO2, BUN, CREATININE, GLU   No results found for this basename: CALCIUM, ALKPHOS, AST, ALT, BILITOT      Lab Results  Component Value Date   WBC 10.1 06/30/2012   HGB 13.8 06/30/2012   HCT 41.0 06/30/2012   MCV 90.5 06/30/2012   PLT 227 06/30/2012    PATHOLOGY: ADDITIONAL INFORMATION: PROGNOSTIC INDICATORS - ACIS Results IMMUNOHISTOCHEMICAL AND MORPHOMETRIC ANALYSIS BY THE AUTOMATED CELLULAR IMAGING  SYSTEM (ACIS) Estrogen Receptor (Negative, <1%): 100%,POSITIVE, STRONG STAINING INTENSITY Progesterone Receptor (Negative, <1%): 60%,POSITIVE, STRONG STAINING INTENSITY Proliferation Marker Ki67 by M IB-1 (Low<20%): 6% All controls stained appropriately Abigail Miyamoto MD Pathologist, Electronic Signature ( Signed 06/23/2012) CHROMOGENIC IN-SITU HYBRIDIZATION Interpretation HER-2/NEU BY CISH - NO AMPLIFICATION OF HER-2 DETECTED. THE RATIO OF HER-2: CEP 17 SIGNALS WAS 1.11. Reference range: Ratio: HER2:CEP17 < 1.8 - gene amplification not observed Ratio: HER2:CEP 17 1.8-2.2 - equivocal result Ratio: HER2:CEP17 > 2.2 - gene amplification observed Abigail Miyamoto MD Pathologist, Electronic Signature ( Signed 06/23/2012) 1 of 2 FINAL for Amber [name], Amber [name] [phone]) FINAL DIAGNOSIS Diagnosis Breast, right, needle core biopsy, mass, 5 o'clock - INVASIVE DUCTAL CARCINOMA, SEE COMMENT - DUCTAL CARCINOMA IN SITU Microscopic Comment Although the grade of tumor is best assessed at resection, with these biopsies, both the in situ and invasive carcinoma are grade I. Breast prognostic studies are pending and will be reported in an addendum. The case was reviewed with Dr. Raynald Blend who concurs. (CR:kh 06-18-12) Amber RUND DO Pathologist, Electronic Signature (Case signed 06/18/2012) Specimen Gross and Clinical Information Specimen Comment Subareolar 5 mm palpable spiculated mass In formalin 8:45, extracted <1 min Specimen(s) Obtained: Breast, right, needle core biopsy, mass, 5 o'clock Specimen Clinical Information Invasive mammary carcinoma Gross Received in formalin are three cores of soft, tan yellow tissue measuring 0.6 to 0.7 cm in length and each 0.2 cm in diameter. The specimen is submitted in toto. (GP:eps 06/17/12)   ASSESSMENT    51 year old female with  #1 stage I (5 mm) invasive ductal carcinoma with DCIS grade 1 ER positive PR positive HER-2/neu negative with Ki-67 6%  and favorable. Patient is a good candidate for breast conservation. She has a hard he been seen by  Dr. Claud Kelp. He is planning on taking her to the OR in week's time. We discussed her pathology today rationale for therapy.  #2 I discussed adjuvant antiestrogen therapy with the patient. Risks and benefits were discussed. I do think that she will need adjuvant radiation therapy as well. I discussed that with her. I have made a referral as well.    PLAN:    #1 patient will proceed with her scheduled surgery.  #2 referral to radiation oncology.  #3 I will plan on seeing her back after her surgery.       Thank you so much for allowing me to participate in the care of Amber [name]. I will continue to follow up the patient with you and assist in her care.  All questions were answered. The patient knows to call the clinic with any problems, questions or concerns. We can certainly see the patient much sooner if necessary.  I spent 60 minutes counseling the patient face to face. The total time spent in the appointment was 60 minutes.  Drue Second, MD Medical/Oncology [facility] [phone] (beeper) [phone] (Office)  06/30/2012, 3:17 PM

## 2012-06-30 NOTE — Telephone Encounter (Signed)
Clydie Braun stated she would call the patient and set up the appointment for [place]  Gave patient appointment for three week appointment

## 2012-06-30 NOTE — Telephone Encounter (Signed)
Called and left message for patient to return call. Per Derrell Lolling notation on OR posting sheet, need to call to see if patient is ready for surgery. Patient has appointment to see Dr. Welton Flakes today at 3:00. Advised in message today I will be out of office this afternoon and if unable to call back to please call tomorrow.

## 2012-06-30 NOTE — Patient Instructions (Addendum)
Proceed with surgery  Refer to radiation oncology   I will see you back in 3 months for follow up or sooner

## 2012-06-30 NOTE — Progress Notes (Signed)
Checked in new pt with no financial concerns. °

## 2012-07-01 ENCOUNTER — Other Ambulatory Visit (INDEPENDENT_AMBULATORY_CARE_PROVIDER_SITE_OTHER): Payer: Self-pay | Admitting: General Surgery

## 2012-07-01 DIAGNOSIS — C50911 Malignant neoplasm of unspecified site of right female breast: Secondary | ICD-10-CM

## 2012-07-05 ENCOUNTER — Telehealth (INDEPENDENT_AMBULATORY_CARE_PROVIDER_SITE_OTHER): Payer: Self-pay | Admitting: General Surgery

## 2012-07-05 ENCOUNTER — Encounter (HOSPITAL_COMMUNITY): Payer: Self-pay | Admitting: Pharmacy Technician

## 2012-07-05 ENCOUNTER — Encounter: Payer: Self-pay | Admitting: *Deleted

## 2012-07-05 NOTE — Telephone Encounter (Signed)
Tried to call back patient per message left with front desk. Had to leave a message for her to return the call.  Patient wants to know if she can proceed with hip surgery on 08/17/12. Per Dr.  Derrell Lolling from a surgical standpoint she can proceed because the incision site should have healed well enough. However, this patient needs to contact and obtain the approval of oncology in order to make sure their plans for her treatment will not interfere with the hip surgery she wants to proceed with.

## 2012-07-05 NOTE — Progress Notes (Signed)
Mailed after appt letter to pt. 

## 2012-07-06 ENCOUNTER — Telehealth (INDEPENDENT_AMBULATORY_CARE_PROVIDER_SITE_OTHER): Payer: Self-pay | Admitting: General Surgery

## 2012-07-06 NOTE — Telephone Encounter (Signed)
Called back patient and advised per Dr. Derrell Lolling that from a surgical standpoint she can proceed with hip surgery, however, she has to contact oncology and speak to them directly to determine if the course of treatment they want to provide would interfere with that surgery. I also advised the patient to use mychart for questions and to contact oncology directly if she has questions pertaining the the form of treatment, medications, dosage, etc. Patient agreed.

## 2012-07-07 ENCOUNTER — Telehealth: Payer: Self-pay | Admitting: *Deleted

## 2012-07-07 NOTE — Telephone Encounter (Signed)
Per MD, Pt may need driver to radiation due to hip surgery. All other appts are fine. Pt verbaliized understanding.

## 2012-07-07 NOTE — Telephone Encounter (Signed)
Message copied by Cooper Render on Wed Jul 07, 2012  1:27 PM ------      Message from: Lorenza Evangelist A      Created: Wed Jul 07, 2012 10:34 AM       Patient called with several questions            1) Breast surgery partial mastectomy 11/14      2) hip surgery moved to 12/17            Dr Derrell Lolling told patient that he is ok with her proceeding with hip surgery on 12/17 if you are ok with it            3) asking if she will need a driver for radiation when she starts            Thanks      Sharyl Nimrod

## 2012-07-09 ENCOUNTER — Encounter (HOSPITAL_COMMUNITY)
Admission: RE | Admit: 2012-07-09 | Discharge: 2012-07-09 | Disposition: A | Discharge status: Home / Self Care | Payer: BC Managed Care – PPO | Source: Ambulatory Visit | Attending: General Surgery | Admitting: General Surgery

## 2012-07-09 ENCOUNTER — Encounter (HOSPITAL_COMMUNITY): Payer: Self-pay

## 2012-07-09 HISTORY — DX: Gastro-esophageal reflux disease without esophagitis: K21.9

## 2012-07-09 HISTORY — DX: Myoneural disorder, unspecified: G70.9

## 2012-07-09 HISTORY — DX: Other chronic pain: G89.29

## 2012-07-09 HISTORY — DX: Cervicalgia: M54.2

## 2012-07-09 LAB — CBC WITH DIFFERENTIAL/PLATELET
Basophils Absolute: 0 10*3/uL (ref 0.0–0.1)
Lymphocytes Relative: 32 % (ref 12–46)
Lymphs Abs: 3.1 10*3/uL (ref 0.7–4.0)
Neutro Abs: 5.6 10*3/uL (ref 1.7–7.7)
Neutrophils Relative %: 58 % (ref 43–77)
Platelets: 253 10*3/uL (ref 150–400)
RBC: 4.53 MIL/uL (ref 3.87–5.11)
WBC: 9.7 10*3/uL (ref 4.0–10.5)

## 2012-07-09 LAB — COMPREHENSIVE METABOLIC PANEL
ALT: 20 U/L (ref 0–35)
AST: 24 U/L (ref 0–37)
Alkaline Phosphatase: 77 U/L (ref 39–117)
CO2: 28 mEq/L (ref 19–32)
GFR calc Af Amer: >90 mL/min (ref >90)
GFR calc non Af Amer: >90 mL/min (ref >90)
Glucose, Bld: 86 mg/dL (ref 70–99)
Potassium: 3.8 mEq/L (ref 3.5–5.1)
Sodium: 141 mEq/L (ref 135–145)

## 2012-07-09 MED ORDER — CHLORHEXIDINE GLUCONATE 4 % EX LIQD: 1.0000 "application " | Freq: Once | CUTANEOUS | Status: DC

## 2012-07-09 NOTE — Pre-Procedure Instructions (Signed)
20 Amber [name]  07/09/2012   Your procedure is scheduled on:  Thursday July 15, 2012  Report to [facility] at 11:00 AM.  Call this number if you have problems the morning of surgery: [phone]   Remember:   Do not eat food or drink :After Midnight.    Take these medicines the morning of surgery with A SIP OF WATER: fosamax, atenolol, levothyroxine, nasonex   Do not wear jewelry, make-up or nail polish.  Do not wear lotions, powders, or perfumes.  Do not shave 48 hours prior to surgery. Men may shave face and neck.  Do not bring valuables to the hospital.  Contacts, dentures or bridgework may not be worn into surgery.  Leave suitcase in the car. After surgery it may be brought to your room.  For patients admitted to the hospital, checkout time is 11:00 AM the day of discharge.   Patients discharged the day of surgery will not be allowed to drive home.  Name and phone number of your driver: family / friend  Special Instructions: Shower using CHG 2 nights before surgery and the night before surgery.  If you shower the day of surgery use CHG.  Use special wash - you have one bottle of CHG for all showers.  You should use approximately 1/3 of the bottle for each shower.   Please read over the following fact sheets that you were given: Pain Booklet, Coughing and Deep Breathing, MRSA Information and Surgical Site Infection Prevention

## 2012-07-12 NOTE — H&P (Signed)
Amber [name]     MRN: [id number]   Description: 51 year old female  Provider: Ernestene Mention, MD  Department: [facility]        Diagnoses     Cancer of lower-inner quadrant of female breast   - Primary    174.3        Vitals   BP Pulse Temp Resp Ht Wt    126/80 66 97.9 F (36.6 C) (Oral) 18 5\' 5"  (1.651 m) 140 lb 12.8 oz (63.866 kg)  BMI - 23.43 kg/m2                 History and Physical     Ernestene Mention, MD   Patient ID: Amber [name], female   DOB: 07-Mar-1961, 51 y.o.   MRN: [id number]                HPI Amber [name] is a 51 y.o. female.  She is referred by Dr. Britta Mccreedy at the breast center [facility] for evaluation and management of a newly diagnosed invasive carcinoma of the right breast at the 5:00 position, subareolar area. Dr. Merri Brunette is her primary care physician. Dr. Everette Rank is her cardiologist . Dr. Margreta Journey is her orthopedist who is planning hip surgery on November 1.   The patient has had almost no breast problems in the past. She had one needle aspiration years ago. Mammograms last year were normal. One month ago she felt a lump in her right breast at the areolar margins at the 5:00 position. She saw Dr. Katrinka Blazing who referred her for further imaging. She underwent diagnostic mammograms and ultrasound of the right breast. Findings were a 5 mm palpable mass at the 5:00 position thought to represent her cancer and a subareolar sebaceous cyst in the same area, also no bigger than 5 mm. I reviewed these films with Dr. Hulan Saas today and these are two distinctly separate areas but they are very close to each other.   Image guided biopsy of the palpable mass shows invasive duct carcinoma mixed with DCIS. Breast diagnostic profile pending.   MRI is done (see below).   The patient is concerned because she is scheduled for a right total hip replacement on November 1 and is not sure whether she wants to cancel that or  not.   We had a long talk about the management of her breast cancer. We talked about the need for central lumpectomy and sentinel node biopsy. We also talked about mastectomy being another standard of care. She is more interested in breast conservation and understands and accepts that she will lose her nipple and areola with a lumpectomy. We talked about wire localization. We talked about risks and complications including bleeding infection cosmetic deformity and reoperation for positive nodes or positive margins.   We talked about involvement of the medical oncologist and she would like to have a consultation with the medical oncologist preop. That has been completed (see below)   Past history reveals myocardial infarction 2001 but no symptoms currently. Pernicious anemia takes vitamin B 12. Hypertension. Hyperlipidemia. Sinus surgery. Upcoming right total hip replacement.   She does not take any estrogen that she knows of but she takes some kind of medicine for hot flashes and she's going to check the components of that. She is advised  to stop all hormone replacement therapy.   Family history is negative for breast cancer, ovarian cancer, or panic or prostate cancer. She  did have a cousin with pancreatic cancer and uncle with a brain cancer.     Past Medical History   Diagnosis  Date   .  Heart attack  2001   .  Anemia     .  Arthritis     .  Hypertension     .  Hyperlipidemia     .  Osteoporosis     .  Thyroid disease         Past Surgical History   Procedure  Date   .  Nasal sinus surgery  1990       Family History   Problem  Relation  Age of Onset   .  Diabetes  Mother     .  Heart disease  Mother     .  Cancer  Maternal Grandmother         pancreatic   .  Cancer  Cousin         pancreatic      Social History History   Substance Use Topics   .  Smoking status:  Current Some Day Smoker -- 0.5 packs/day   .  Smokeless tobacco:  Never Used     Comment: half a/pk  QD  for 20 yrs   .  Alcohol Use:  Yes         2 drinks per week       Allergies   Allergen  Reactions   .  Tramadol         Stomach upset       Current Outpatient Prescriptions   Medication  Sig  Dispense  Refill   .  alendronate (FOSAMAX) 70 MG tablet  Take 70 mg by mouth every 7 (seven) days. Take with a full glass of water on an empty stomach. Saturday         .  amoxicillin (AMOXIL) 500 MG capsule           .  aspirin EC 81 MG tablet  Take 81 mg by mouth at bedtime.         Marland Kitchen  atenolol (TENORMIN) 25 MG tablet  Take 25 mg by mouth daily.         Marland Kitchen  atorvastatin (LIPITOR) 80 MG tablet  Take 80 mg by mouth at bedtime.         Marland Kitchen  BIOTIN PO  Take 2 tablets by mouth daily.         .  Cholecalciferol (VITAMIN D PO)  Take 1 tablet by mouth daily.         .  Coenzyme Q10 (COQ10 PO)  Take 1 tablet by mouth daily.         .  cyanocobalamin (,VITAMIN B-12,) 1000 MCG/ML injection  Inject 1,000 mcg into the muscle every 30 (thirty) days. As needed when feeling tired         .  ezetimibe (ZETIA) 10 MG tablet  Take 10 mg by mouth daily.         Marland Kitchen  GLUCOSAMINE-CHONDROITIN PO  Take 1 tablet by mouth daily.         .  IRON PO  Take 1 tablet by mouth at bedtime as needed. When feeling tired         .  levothyroxine (SYNTHROID, LEVOTHROID) 112 MCG tablet  Take 112 mcg by mouth every morning.         Marland Kitchen  lisinopril (PRINIVIL,ZESTRIL) 5 MG tablet  Take 5 mg  by mouth daily.         .  meloxicam (MOBIC) 15 MG tablet           .  mometasone (NASONEX) 50 MCG/ACT nasal spray  Place 2 sprays into the nose daily as needed. For allergies         .  Omega-3 Fatty Acids (FISH OIL PO)  Take 1 capsule by mouth daily.            Review of Systems   Constitutional: Negative for fever, chills and unexpected weight change.  HENT: Negative for hearing loss, congestion, sore throat, trouble swallowing and voice change.   Eyes: Negative for visual disturbance.  Respiratory: Negative for cough and wheezing.     Cardiovascular: Negative for chest pain, palpitations and leg swelling.  Gastrointestinal: Negative for nausea, vomiting, abdominal pain, diarrhea, constipation, blood in stool, abdominal distention and anal bleeding.  Genitourinary: Negative for hematuria, vaginal bleeding and difficulty urinating.  Musculoskeletal: Positive for arthralgias.  Skin: Negative for rash and wound.  Neurological: Negative for seizures, syncope and headaches.  Hematological: Negative for adenopathy. Does not bruise/bleed easily.  Psychiatric/Behavioral: Negative for confusion.    Blood pressure 126/80, pulse 66, temperature 97.9 F (36.6 C), temperature source Oral, resp. rate 18, height 5\' 5"  (1.651 m), weight 140 lb 12.8 oz (63.866 kg).   Physical Exam  Constitutional: She is oriented to person, place, and time. She appears well-developed and well-nourished. No distress.  HENT:   Head: Normocephalic and atraumatic.   Nose: Nose normal.   Mouth/Throat: No oropharyngeal exudate.  Eyes: Conjunctivae normal and EOM are normal. Pupils are equal, round, and reactive to light. Left eye exhibits no discharge. No scleral icterus.  Neck: Neck supple. No JVD present. No tracheal deviation present. No thyromegaly present.  Cardiovascular: Normal rate, regular rhythm, normal heart sounds and intact distal pulses.    No murmur heard. Pulmonary/Chest: Effort normal and breath sounds normal. No respiratory distress. She has no wheezes. She has no rales. She exhibits no tenderness.       Breasts are medium to medium large in size. In the right breast there is a small palpable mass just under the areolar margin at the 5:00 position. This is consistent with her biopsy cancer. I don't really feel a sebaceous cyst although there may be a small nodule just toward the 6 oclock position at the areolar margin. No other mass in either breast. No other skin changes. No axillary adenopathy.  Abdominal: Soft. Bowel sounds are normal.  She exhibits no distension and no mass. There is no tenderness. There is no rebound and no guarding.  Musculoskeletal: She exhibits no edema and no tenderness.  Lymphadenopathy:    She has no cervical adenopathy.  Neurological: She is alert and oriented to person, place, and time. She exhibits normal muscle tone. Coordination normal.  Skin: Skin is warm. No rash noted. She is not diaphoretic. No erythema. No pallor.  Psychiatric: She has a normal mood and affect. Her behavior is normal. Judgment and thought content normal.    Data Reviewed Imaging studies. Pathology report.   Assessment 5 mm invasive ductal carcinoma right breast, subareolar area, 5:00 position, palpable. Breast diagnostic profile pending. Clinical stage TIB, N0.   Patient expressed a desire for breast conservation   History of myocardial infarction 2001. Asymptomatic. Followed by cardiology   Pernicious anemia   Hypertension   Upcoming right total hip replacement   Negative family history for breast or ovarian cancer  Plan We had a very long and very productive discussion with her and one of her friends.   MRI done(see below)   Patient is going to call Dr. Margreta Journey tomorrow to discuss whether or not to cancel her right total hip replacement. She understands that the breast cancer is a more threatening disease process than the hip.   She has seen Dr. Welton Flakes from medical oncology   She was  presented at breast conference.   She will need cardiac clearance prior to any breast surgery   Unless we find multifocal or bilateral disease, she should be a good candidate for a central lumpectomy with needle localization and right axillary sentinel lymph node biopsy. We've talked about the indications details techniques and risks of the surgery. She understands all these issues. All her questions are answered.    ADDENDUM:  (07-01-2012) - MRI shows a small enhancing mass in the right breast, subareolar  area. This is a solitary finding. There are no other abnormalities and no adenopathy. She has seen Dr. Welton Flakes. She will be referred to Dr. Michell Heinrich for radiation oncology consultation. She is ready to schedule her surgery and we are proceeding with scheduling.       Angelia Mould. Derrell Lolling, M.D., [facility] General and Minimally invasive Surgery Breast and Colorectal Surgery Office:   [phone] Pager:   [phone]

## 2012-07-13 ENCOUNTER — Encounter (INDEPENDENT_AMBULATORY_CARE_PROVIDER_SITE_OTHER): Payer: Self-pay

## 2012-07-14 MED ORDER — CEFAZOLIN SODIUM-DEXTROSE 2-3 GM-% IV SOLR
2.0000 g | INTRAVENOUS | Status: AC
  Administered 2012-07-15: 2 g via INTRAVENOUS
  Filled 2012-07-14: qty 50

## 2012-07-14 MED ORDER — HEPARIN SODIUM (PORCINE) 5000 UNIT/ML IJ SOLN
5000.0000 [IU] | Freq: Once | INTRAMUSCULAR | Status: AC
  Administered 2012-07-15: 5000 [IU] via SUBCUTANEOUS
  Filled 2012-07-14: qty 1

## 2012-07-15 ENCOUNTER — Encounter (HOSPITAL_COMMUNITY)
Admission: RE | Disposition: A | Discharge status: Home / Self Care | Payer: Self-pay | Source: Ambulatory Visit | Attending: General Surgery

## 2012-07-15 ENCOUNTER — Ambulatory Visit (HOSPITAL_COMMUNITY)
Admission: RE | Admit: 2012-07-15 | Discharge: 2012-07-15 | Disposition: A | Discharge status: Home / Self Care | Payer: BC Managed Care – PPO | Source: Ambulatory Visit | Attending: General Surgery | Admitting: General Surgery

## 2012-07-15 ENCOUNTER — Encounter (HOSPITAL_COMMUNITY): Payer: Self-pay | Admitting: Critical Care Medicine

## 2012-07-15 ENCOUNTER — Encounter (HOSPITAL_COMMUNITY)
Admission: RE | Admit: 2012-07-15 | Discharge: 2012-07-15 | Disposition: A | Discharge status: Home / Self Care | Payer: BC Managed Care – PPO | Source: Ambulatory Visit | Attending: General Surgery | Admitting: General Surgery

## 2012-07-15 ENCOUNTER — Ambulatory Visit (HOSPITAL_COMMUNITY): Payer: BC Managed Care – PPO | Admitting: Critical Care Medicine

## 2012-07-15 ENCOUNTER — Other Ambulatory Visit (INDEPENDENT_AMBULATORY_CARE_PROVIDER_SITE_OTHER): Payer: Self-pay | Admitting: General Surgery

## 2012-07-15 ENCOUNTER — Ambulatory Visit
Admission: RE | Admit: 2012-07-15 | Discharge: 2012-07-15 | Disposition: A | Discharge status: Home / Self Care | Payer: BC Managed Care – PPO | Source: Ambulatory Visit | Attending: General Surgery | Admitting: General Surgery

## 2012-07-15 DIAGNOSIS — C50911 Malignant neoplasm of unspecified site of right female breast: Secondary | ICD-10-CM

## 2012-07-15 DIAGNOSIS — E039 Hypothyroidism, unspecified: Secondary | ICD-10-CM | POA: Insufficient documentation

## 2012-07-15 DIAGNOSIS — Z01812 Encounter for preprocedural laboratory examination: Secondary | ICD-10-CM | POA: Insufficient documentation

## 2012-07-15 DIAGNOSIS — I10 Essential (primary) hypertension: Secondary | ICD-10-CM | POA: Insufficient documentation

## 2012-07-15 DIAGNOSIS — C50319 Malignant neoplasm of lower-inner quadrant of unspecified female breast: Secondary | ICD-10-CM | POA: Insufficient documentation

## 2012-07-15 DIAGNOSIS — C50919 Malignant neoplasm of unspecified site of unspecified female breast: Secondary | ICD-10-CM

## 2012-07-15 DIAGNOSIS — Z01810 Encounter for preprocedural cardiovascular examination: Secondary | ICD-10-CM | POA: Insufficient documentation

## 2012-07-15 DIAGNOSIS — M810 Age-related osteoporosis without current pathological fracture: Secondary | ICD-10-CM | POA: Insufficient documentation

## 2012-07-15 DIAGNOSIS — Z01818 Encounter for other preprocedural examination: Secondary | ICD-10-CM | POA: Insufficient documentation

## 2012-07-15 DIAGNOSIS — F17200 Nicotine dependence, unspecified, uncomplicated: Secondary | ICD-10-CM | POA: Insufficient documentation

## 2012-07-15 DIAGNOSIS — E785 Hyperlipidemia, unspecified: Secondary | ICD-10-CM | POA: Insufficient documentation

## 2012-07-15 HISTORY — PX: PARTIAL MASTECTOMY WITH NEEDLE LOCALIZATION AND AXILLARY SENTINEL LYMPH NODE BX: SHX6009

## 2012-07-15 SURGERY — PARTIAL MASTECTOMY WITH NEEDLE LOCALIZATION AND AXILLARY SENTINEL LYMPH NODE BX
Anesthesia: General | Site: Breast | Laterality: Right | Wound class: Clean

## 2012-07-15 MED ORDER — METHYLENE BLUE 1 % INJ SOLN
INTRAMUSCULAR | Status: AC
  Filled 2012-07-15: qty 10

## 2012-07-15 MED ORDER — PROPOFOL 10 MG/ML IV BOLUS
INTRAVENOUS | Status: DC | PRN
  Administered 2012-07-15: 130 mg via INTRAVENOUS

## 2012-07-15 MED ORDER — ONDANSETRON HCL 4 MG/2ML IJ SOLN
INTRAMUSCULAR | Status: DC | PRN
  Administered 2012-07-15: 4 mg via INTRAVENOUS

## 2012-07-15 MED ORDER — MEPERIDINE HCL 25 MG/ML IJ SOLN: 6.2500 mg | INTRAMUSCULAR | Status: DC | PRN

## 2012-07-15 MED ORDER — HYDROMORPHONE HCL PF 1 MG/ML IJ SOLN: 0.2500 mg | INTRAMUSCULAR | Status: DC | PRN

## 2012-07-15 MED ORDER — SODIUM CHLORIDE 0.9 % IJ SOLN
INTRAMUSCULAR | Status: DC | PRN
  Administered 2012-07-15: 13:00:00 via INTRAMUSCULAR

## 2012-07-15 MED ORDER — LIDOCAINE-EPINEPHRINE (PF) 1 %-1:200000 IJ SOLN
INTRAMUSCULAR | Status: AC
  Filled 2012-07-15: qty 10

## 2012-07-15 MED ORDER — FENTANYL CITRATE 0.05 MG/ML IJ SOLN
100.0000 ug | Freq: Once | INTRAMUSCULAR | Status: AC
  Administered 2012-07-15: 100 ug via INTRAVENOUS

## 2012-07-15 MED ORDER — LACTATED RINGERS IV SOLN
INTRAVENOUS | Status: DC | PRN
  Administered 2012-07-15 (×2): via INTRAVENOUS

## 2012-07-15 MED ORDER — LIDOCAINE HCL (CARDIAC) 20 MG/ML IV SOLN
INTRAVENOUS | Status: DC | PRN
  Administered 2012-07-15: 100 mg via INTRAVENOUS

## 2012-07-15 MED ORDER — TECHNETIUM TC 99M SULFUR COLLOID FILTERED
1.0000 | Freq: Once | INTRAVENOUS | Status: AC | PRN
  Administered 2012-07-15: 1 via INTRADERMAL

## 2012-07-15 MED ORDER — ONDANSETRON HCL 4 MG/2ML IJ SOLN: 4.0000 mg | Freq: Once | INTRAMUSCULAR | Status: DC | PRN

## 2012-07-15 MED ORDER — FENTANYL CITRATE 0.05 MG/ML IJ SOLN
INTRAMUSCULAR | Status: AC
  Filled 2012-07-15: qty 2

## 2012-07-15 MED ORDER — MIDAZOLAM HCL 2 MG/2ML IJ SOLN
INTRAMUSCULAR | Status: AC
  Filled 2012-07-15: qty 2

## 2012-07-15 MED ORDER — OXYCODONE HCL 5 MG PO TABS: 5.0000 mg | ORAL_TABLET | Freq: Once | ORAL | Status: DC | PRN

## 2012-07-15 MED ORDER — OXYCODONE HCL 5 MG/5ML PO SOLN: 5.0000 mg | Freq: Once | ORAL | Status: DC | PRN

## 2012-07-15 MED ORDER — EPHEDRINE SULFATE 50 MG/ML IJ SOLN
INTRAMUSCULAR | Status: DC | PRN
  Administered 2012-07-15 (×2): 10 mg via INTRAVENOUS

## 2012-07-15 MED ORDER — DEXAMETHASONE SODIUM PHOSPHATE 4 MG/ML IJ SOLN
INTRAMUSCULAR | Status: DC | PRN
  Administered 2012-07-15: 4 mg via INTRAVENOUS

## 2012-07-15 MED ORDER — LIDOCAINE-EPINEPHRINE (PF) 1 %-1:200000 IJ SOLN
INTRAMUSCULAR | Status: DC | PRN
  Administered 2012-07-15: 20 mL

## 2012-07-15 MED ORDER — FENTANYL CITRATE 0.05 MG/ML IJ SOLN
INTRAMUSCULAR | Status: DC | PRN
  Administered 2012-07-15: 25 ug via INTRAVENOUS

## 2012-07-15 MED ORDER — 0.9 % SODIUM CHLORIDE (POUR BTL) OPTIME
TOPICAL | Status: DC | PRN
  Administered 2012-07-15: 1000 mL

## 2012-07-15 MED ORDER — HYDROCODONE-ACETAMINOPHEN 5-325 MG PO TABS: 1.0000 | ORAL_TABLET | ORAL | Status: DC | PRN

## 2012-07-15 MED ORDER — MIDAZOLAM HCL 5 MG/ML IJ SOLN
1.0000 mg | Freq: Once | INTRAMUSCULAR | Status: AC
  Administered 2012-07-15: 1 mg via INTRAVENOUS

## 2012-07-15 MED ORDER — LACTATED RINGERS IV SOLN
INTRAVENOUS | Status: DC
  Administered 2012-07-15: 13:00:00 via INTRAVENOUS

## 2012-07-15 SURGICAL SUPPLY — 59 items
ADH SKN CLS APL DERMABOND .7 (GAUZE/BANDAGES/DRESSINGS) ×1
ADH SKN CLS LQ APL DERMABOND (GAUZE/BANDAGES/DRESSINGS) ×2
APL SKNCLS STERI-STRIP NONHPOA (GAUZE/BANDAGES/DRESSINGS)
APPLIER CLIP 9.375 MED OPEN (MISCELLANEOUS) ×2
APR CLP MED 9.3 20 MLT OPN (MISCELLANEOUS) ×1
BENZOIN TINCTURE PRP APPL 2/3 (GAUZE/BANDAGES/DRESSINGS) ×1 IMPLANT
BINDER BREAST LRG (GAUZE/BANDAGES/DRESSINGS) IMPLANT
BINDER BREAST XLRG (GAUZE/BANDAGES/DRESSINGS) ×1 IMPLANT
BLADE SURG ROTATE 9660 (MISCELLANEOUS) IMPLANT
CANISTER SUCTION 2500CC (MISCELLANEOUS) ×2 IMPLANT
CHLORAPREP W/TINT 26ML (MISCELLANEOUS) ×2 IMPLANT
CLIP APPLIE 9.375 MED OPEN (MISCELLANEOUS) ×1 IMPLANT
CLOTH BEACON ORANGE TIMEOUT ST (SAFETY) ×2 IMPLANT
CONT SPEC 4OZ CLIKSEAL STRL BL (MISCELLANEOUS) ×3 IMPLANT
COVER PROBE W GEL 5X96 (DRAPES) ×2 IMPLANT
COVER SURGICAL LIGHT HANDLE (MISCELLANEOUS) ×2 IMPLANT
DECANTER SPIKE VIAL GLASS SM (MISCELLANEOUS) ×2 IMPLANT
DERMABOND ADHESIVE PROPEN (GAUZE/BANDAGES/DRESSINGS) ×2
DERMABOND ADVANCED (GAUZE/BANDAGES/DRESSINGS) ×1
DERMABOND ADVANCED .7 DNX12 (GAUZE/BANDAGES/DRESSINGS) ×1 IMPLANT
DERMABOND ADVANCED .7 DNX6 (GAUZE/BANDAGES/DRESSINGS) IMPLANT
DEVICE DUBIN SPECIMEN MAMMOGRA (MISCELLANEOUS) ×2 IMPLANT
DRAIN CHANNEL 19F RND (DRAIN) IMPLANT
DRAPE LAPAROSCOPIC ABDOMINAL (DRAPES) ×2 IMPLANT
DRAPE UTILITY 15X26 W/TAPE STR (DRAPE) ×4 IMPLANT
DRSG PAD ABDOMINAL 8X10 ST (GAUZE/BANDAGES/DRESSINGS) ×2 IMPLANT
ELECT CAUTERY BLADE 6.4 (BLADE) ×2 IMPLANT
ELECT REM PT RETURN 9FT ADLT (ELECTROSURGICAL) ×2
ELECTRODE REM PT RTRN 9FT ADLT (ELECTROSURGICAL) ×1 IMPLANT
EVACUATOR SILICONE 100CC (DRAIN) IMPLANT
GLOVE ECLIPSE 6.5 STRL STRAW (GLOVE) ×2 IMPLANT
GLOVE EUDERMIC 7 POWDERFREE (GLOVE) ×2 IMPLANT
GOWN PREVENTION PLUS XLARGE (GOWN DISPOSABLE) ×2 IMPLANT
GOWN STRL NON-REIN LRG LVL3 (GOWN DISPOSABLE) ×2 IMPLANT
KIT BASIN OR (CUSTOM PROCEDURE TRAY) ×2 IMPLANT
KIT MARKER MARGIN INK (KITS) ×2 IMPLANT
KIT ROOM TURNOVER OR (KITS) ×2 IMPLANT
NDL 18GX1X1/2 (RX/OR ONLY) (NEEDLE) ×1 IMPLANT
NDL HYPO 25GX1X1/2 BEV (NEEDLE) ×2 IMPLANT
NEEDLE 18GX1X1/2 (RX/OR ONLY) (NEEDLE) ×2 IMPLANT
NEEDLE HYPO 25GX1X1/2 BEV (NEEDLE) ×4 IMPLANT
NS IRRIG 1000ML POUR BTL (IV SOLUTION) ×2 IMPLANT
PACK GENERAL/GYN (CUSTOM PROCEDURE TRAY) ×2 IMPLANT
PAD ARMBOARD 7.5X6 YLW CONV (MISCELLANEOUS) ×2 IMPLANT
SPONGE GAUZE 4X4 12PLY (GAUZE/BANDAGES/DRESSINGS) ×2 IMPLANT
SPONGE LAP 4X18 X RAY DECT (DISPOSABLE) ×2 IMPLANT
STAPLER VISISTAT 35W (STAPLE) IMPLANT
STRIP CLOSURE SKIN 1/2X4 (GAUZE/BANDAGES/DRESSINGS) ×2 IMPLANT
SUT ETHILON 3 0 FSL (SUTURE) IMPLANT
SUT MNCRL AB 4-0 PS2 18 (SUTURE) ×3 IMPLANT
SUT SILK 2 0 SH (SUTURE) ×2 IMPLANT
SUT VIC AB 3-0 SH 18 (SUTURE) ×4 IMPLANT
SUT VIC AB 3-0 SH 27 (SUTURE) ×2
SUT VIC AB 3-0 SH 27XBRD (SUTURE) ×1 IMPLANT
SUT VICRYL AB 3 0 TIES (SUTURE) IMPLANT
SYR CONTROL 10ML LL (SYRINGE) ×4 IMPLANT
TOWEL OR 17X24 6PK STRL BLUE (TOWEL DISPOSABLE) ×2 IMPLANT
TOWEL OR 17X26 10 PK STRL BLUE (TOWEL DISPOSABLE) ×2 IMPLANT
TOWEL OR NON WOVEN STRL DISP B (DISPOSABLE) ×1 IMPLANT

## 2012-07-15 NOTE — Progress Notes (Signed)
Rec'd report from [facility], assuming care of patient at this time

## 2012-07-15 NOTE — Interval H&P Note (Signed)
History and Physical Interval Note:  07/15/2012 1:22 PM  Analyse F Pooley  has presented today for surgery, with the diagnosis of right breast cancer   The goals and the various methods of treatment have been discussed with the patient and family. After consideration of risks, benefits and other options for treatment, the patient has consented to  Procedure(s) (LRB) with comments: PARTIAL MASTECTOMY WITH NEEDLE LOCALIZATION AND AXILLARY SENTINEL LYMPH NODE BX (Right) as a surgical intervention .  The patient's history has been reviewed, patient examined today , no change in status, stable for surgery.  I have reviewed the patient's chart and labs.  Questions were answered to the patient's satisfaction.     Ernestene Mention

## 2012-07-15 NOTE — Op Note (Signed)
Patient Name:           Amber [name]   Date of Surgery:        07/15/2012  Pre op Diagnosis:      Invasive ductal carcinoma right breast, subareolar position, ER positive, Her-2 negative,  clinical stage T1b., N0  Post op Diagnosis:    Same  Procedure:                 Right central lumpectomy with needle localization, injection blue dye right breast, right axillary sentinel node biopsy  Surgeon:                     Angelia Mould. Derrell Lolling, M.D., FACS  Assistant:                      None  Operative Indications:   Amber [name] is a 51 y.o. female. She is referred by Dr. Britta Mccreedy at the breast center [facility] for evaluation and management of a newly diagnosed invasive carcinoma of the right breast at the 5:00 position, subareolar area. Dr. Merri Brunette is her primary care physician. Dr. Everette Rank is her cardiologist .  The patient has had almost no breast problems in the past. She had one needle aspiration years ago. Mammograms last year were normal. One month ago she felt a lump in her right breast at the areolar margins at the 5:00 position. She saw Dr. Katrinka Blazing who referred her for further imaging. She underwent diagnostic mammograms and ultrasound of the right breast. Findings were a 5 mm palpable mass at the 5:00 position thought to represent her cancer and a subareolar sebaceous cyst in the same area, also no bigger than 5 mm. I reviewed these films with Dr. Hulan Saas today and these are two distinctly separate areas but they are very close to each other.  Image guided biopsy of the palpable mass shows invasive duct carcinoma mixed with DCIS. Breast diagnostic profile pending.  MRI Showed this to be a solitary enhancing finding.  We had a long talk about the management of her breast cancer. We talked about the need for central lumpectomy and sentinel node biopsy. We also talked about mastectomy. re. She is more interested in breast conservation and understands and accepts that  she will lose her nipple and areola with a lumpectomy.  We talked about involvement of the medical oncologist and she would like to have a consultation with the medical oncologist preop.   Past history reveals myocardial infarction 2001 but no symptoms currently. Pernicious anemia takes vitamin B 12. Hypertension. Hyperlipidemia.She is brought to the operating room electively   Operative Findings:       The localizing wire was well placed. He was placed from medial to lateral and went through the marker clip. A central lumpectomy was performed and the specimen mammogram looked good with the clip and the wire centrally located within the specimen. We found 2 sentinel nodes.  Procedure in Detail:          The patient underwent wire localization at the breast [facility], and the wire position was good. She was brought to the holding area at [facility] and radionuclide was injected into the right breast by the nuclear medicine technician. The patient was taken to operating room where general anesthesia was induced. Surgical time out was performed. Intravenous antibiotics were given. Following alcohol prep I injected 5 cc of blue dye into the right breast subareolar  area. The breast was massaged for 5 minutes. The right breast and right axilla were then prepped and draped in a sterile fashion. 1% Xylocaine with epinephrine was used as local infiltration anesthetic. Using a marking pen I marked a transverse elliptical incision. I carried the incision more inferiorly because that's where the tumor was and the second nodule which had been seen on mammography was. I made a transverse elliptical incision with the knife.. I perormed a lumpectomy with electrocautery. The specimen was removed and marked with the ink color kit.   Specimen mammogram looked good. Specimen was sent to the lab. Hemostasis was excellent and achieved with  electrocautery. The metal clips were placed in the lumpectomy cavity. The breast  tissues were closed in several layers with interrupted sutures of 3-0 Vicryl and the skin closed with a running subcuticular suture of 4-0 Monocryl and Dermabond.  Transverse incision was made in the right axilla at the hairline. Using the neoprobe I dissected down through the subcutaneous tissue and incised the clavipectoral fascia. I found 2 sentinel nodes. Both were very blue and both had a lot of radioactivity. After these were removed I found no other radioactivity and no other blue dye. These were sent to the lab. Hemoasis was excellent. The wound was irrigated. The tissues were closed with 3-0 Vicryl sutures and skin closed with a running 4-0 Monocryl and Dermabond. A breast binder placed. The patient was taken to recovery in stable condition. EBL 20 cc or less. Complications none. Counts correct.     Angelia Mould. Derrell Lolling, M.D., FACS General and Minimally Invasive Surgery Breast and Colorectal Surgery  07/15/2012 2:36 PM

## 2012-07-15 NOTE — Anesthesia Postprocedure Evaluation (Signed)
  Anesthesia Post-op Note  Patient: Amber [name]  Procedure(s) Performed: Procedure(s) (LRB) with comments: PARTIAL MASTECTOMY WITH NEEDLE LOCALIZATION AND AXILLARY SENTINEL LYMPH NODE BX (Right)  Patient Location: PACU  Anesthesia Type:General  Level of Consciousness: awake  Airway and Oxygen Therapy: Patient Spontanous Breathing  Post-op Pain: mild  Post-op Assessment: Post-op Vital signs reviewed  Post-op Vital Signs: Reviewed  Complications: No apparent anesthesia complications

## 2012-07-15 NOTE — Preoperative (Signed)
Beta Blockers   Reason not to administer Beta Blockers:Not Applicable, pt took 11/14 @ 0700

## 2012-07-15 NOTE — Anesthesia Preprocedure Evaluation (Addendum)
Anesthesia Evaluation  Patient identified by MRN, date of birth, ID band Patient awake    Reviewed: Allergy & Precautions, H&P , NPO status , Patient's Chart, lab work & pertinent test results, reviewed documented beta blocker date and time   Airway Mallampati: I TM Distance: >3 FB Neck ROM: Full    Dental  (+) Dental Advisory Given   Pulmonary Current Smoker,          Cardiovascular hypertension, Pt. on home beta blockers + Past MI     Neuro/Psych  Headaches,    GI/Hepatic GERD-  ,  Endo/Other  Hypothyroidism   Renal/GU      Musculoskeletal  (+) Arthritis -,   Abdominal   Peds  Hematology   Anesthesia Other Findings   Reproductive/Obstetrics                         Anesthesia Physical Anesthesia Plan  ASA: III  Anesthesia Plan: General   Post-op Pain Management:    Induction: Intravenous  Airway Management Planned: LMA  Additional Equipment:   Intra-op Plan:   Post-operative Plan: Extubation in OR  Informed Consent: I have reviewed the patients History and Physical, chart, labs and discussed the procedure including the risks, benefits and alternatives for the proposed anesthesia with the patient or authorized representative who has indicated his/her understanding and acceptance.   Dental advisory given  Plan Discussed with: Surgeon and CRNA  Anesthesia Plan Comments:        Anesthesia Quick Evaluation

## 2012-07-15 NOTE — Transfer of Care (Signed)
Immediate Anesthesia Transfer of Care Note  Patient: Amber [name]  Procedure(s) Performed: Procedure(s) (LRB) with comments: PARTIAL MASTECTOMY WITH NEEDLE LOCALIZATION AND AXILLARY SENTINEL LYMPH NODE BX (Right)  Patient Location: PACU  Anesthesia Type:General  Level of Consciousness: awake and alert   Airway & Oxygen Therapy: Patient Spontanous Breathing and Patient connected to nasal cannula oxygen  Post-op Assessment: Report given to PACU RN, Post -op Vital signs reviewed and stable and Patient moving all extremities X 4  Post vital signs: reviewed and stable  Complications: No apparent anesthesia complications

## 2012-07-16 ENCOUNTER — Encounter (HOSPITAL_COMMUNITY): Payer: Self-pay | Admitting: General Surgery

## 2012-07-19 ENCOUNTER — Telehealth (INDEPENDENT_AMBULATORY_CARE_PROVIDER_SITE_OTHER): Payer: Self-pay | Admitting: General Surgery

## 2012-07-19 NOTE — Telephone Encounter (Signed)
Pathology:   IDC(6 mm);  0/2 nodes;    ER+;   Her-2 neg;       T1b, N0  Discussed with patient. She has appt. With Dr. Michell Heinrich Nov. 20 and appt. With me Dec. 2.    Erastus Bartolomei M. Derrell Lolling, M.D., [facility] General and Minimally invasive Surgery Breast and Colorectal Surgery Office:   [phone] Pager:   [phone]

## 2012-07-21 ENCOUNTER — Encounter: Payer: Self-pay | Admitting: Radiation Oncology

## 2012-07-21 ENCOUNTER — Ambulatory Visit
Admission: RE | Admit: 2012-07-21 | Discharge: 2012-07-21 | Disposition: A | Discharge status: Home / Self Care | Payer: BC Managed Care – PPO | Source: Ambulatory Visit | Attending: Radiation Oncology | Admitting: Radiation Oncology

## 2012-07-21 ENCOUNTER — Encounter: Payer: Self-pay | Admitting: Oncology

## 2012-07-21 VITALS — BP 121/59 | HR 74 | Temp 98.8°F | Resp 18 | Ht 65.0 in | Wt 141.4 lb

## 2012-07-21 DIAGNOSIS — C50319 Malignant neoplasm of lower-inner quadrant of unspecified female breast: Secondary | ICD-10-CM

## 2012-07-21 NOTE — Progress Notes (Signed)
Patient presented to the clinic today for consultation with Dr. Michell Heinrich to discuss the role of radiation therapy in the treatment of right breast cancer. Patient alert and oriented to person, place, and time. No distress noted. Steady gait noted. Flat affect noted. Patient denies pain at this time. Patient reports incisions from breast surgery are well approximated without redness, drainage or edema. Patient reports that Dr. Katrinka Blazing, her PCP, looked at her surgical incisions on Monday and reported she was healing fine. Patient reports seeing PCP from rash. Patient reports that her PCP suspects she is allergic "to what she was bathed in prior to surgery." Patient using triamcinolone cream and hydroxyzine hcl tablets and reports that rash has improved. Right hip surgery scheduled for December 17th. Patient demonstrates full rom of right upper extremity and denies swelling of this arm. Patient denies nausea, vomiting, headache, dizziness or diarrhea. Patient denies unintentional weight loss. Patient reports sleeping and eating without difficulty. Reported all findings to Dr. Michell Heinrich.

## 2012-07-21 NOTE — Progress Notes (Signed)
See progress note under physician encounter. 

## 2012-07-21 NOTE — Progress Notes (Signed)
Radiation Oncology         ([phone]) [phone] ________________________________  Initial outpatient Consultation  Name: Amber [name] MRN: [id number]  Date: 07/21/2012  DOB: [birth year]  REFERRING PHYSICIAN: Victorino December, MD  DIAGNOSIS: The encounter diagnosis was Cancer of lower-inner quadrant of female breast.  HISTORY OF PRESENT ILLNESS::Amber [name] is a 51 y.o. female  who palpated a right breast mass in November of this year. Invasive cancer was found in the central aspect of the right breast. She was unfortunately scheduled to have a right hip replacement about that time and not had to be delayed. Further workup including diagnostic mammograms and ultrasound confirmed a 5 mm palpable mass at the 5:00 position as well as a sebaceous cyst. Image guided in biopsy of the mass showed invasive ductal carcinoma with associated DCIS. She underwent an MRI of the bilateral breasts on 06/23/2012 which showed a solitary 5 mm enhancing mass in the subareolar aspect of the right breast. No evidence of malignancy and no lymphadenopathy was noted. She elected for breast conservation and underwent a right lumpectomy and sentinel lymph node biopsy on 07/15/2012. This revealed a 0.6 cm invasive ductal carcinoma with negative margins and 0 out of one lymph node positive. The tumor was estrogen and progesterone receptor positive. HER-2 was negative. Ki-67 was 6%. She is healed up from her surgery fairly well. She did have a postoperative complication of having extreme rash over her neck and chest. This is felt to be secondary to the wife she used at surgery. She has a followup visit with Dr. Welton Flakes next week and with Dr. Derrell Lolling on December 2. She has rescheduled her hip replacement for December 17. She is very motivated to get that surgery done. She has no headaches or bone pain other than her right hip pain which is constant.  PREVIOUS RADIATION THERAPY: No  PAST MEDICAL HISTORY:  has a past medical history of  Arthritis; Hyperlipidemia; Osteoporosis; Thyroid disease; Breast cancer; Heart attack (2001); Hypertension; GERD (gastroesophageal reflux disease); Headache; Neuromuscular disorder; Anemia; and Chronic neck pain.    PAST SURGICAL HISTORY: Past Surgical History  Procedure Date  . Nasal sinus surgery 1990  . Coronary angioplasty     stent placement  2001  . Partial mastectomy with needle localization and axillary sentinel lymph node bx 07/15/2012    Procedure: PARTIAL MASTECTOMY WITH NEEDLE LOCALIZATION AND AXILLARY SENTINEL LYMPH NODE BX;  Surgeon: Ernestene Mention, MD;  Location: [facility] OR;  Service: General;  Laterality: Right;  . Breast biopsy     FAMILY HISTORY: family history includes Cancer in her cousin and maternal grandmother; Diabetes in her mother; and Heart disease in her mother.  SOCIAL HISTORY:  reports that she has been smoking Cigarettes.  She has a 10 pack-year smoking history. She has never used smokeless tobacco. She reports that she drinks alcohol. She reports that she does not use illicit drugs.  ALLERGIES: Ace inhibitors and Tramadol  MEDICATIONS:  Current Outpatient Prescriptions  Medication Sig Dispense Refill  . alendronate (FOSAMAX) 70 MG tablet Take 70 mg by mouth every 7 (seven) days. Take with a full glass of water on an empty stomach. Saturday      . aspirin EC 81 MG tablet Take 81 mg by mouth at bedtime.      Marland Kitchen atenolol (TENORMIN) 25 MG tablet Take 25 mg by mouth daily.      Marland Kitchen atorvastatin (LIPITOR) 80 MG tablet Take 80 mg by mouth at bedtime.      Marland Kitchen  BIOTIN PO Take 2 tablets by mouth daily.      . Coenzyme Q10 (COQ10 PO) Take 1 tablet by mouth daily.      . cyanocobalamin (,VITAMIN B-12,) 1000 MCG/ML injection Inject 1,000 mcg into the muscle every 30 (thirty) days. As needed when feeling tired      . cyclobenzaprine (FLEXERIL) 10 MG tablet       . ezetimibe (ZETIA) 10 MG tablet Take 10 mg by mouth daily.      Marland Kitchen GLUCOSAMINE-CHONDROITIN PO Take 1 tablet by  mouth daily.      . hydrOXYzine (ATARAX/VISTARIL) 25 MG tablet       . IRON PO Take 1 tablet by mouth at bedtime as needed. When feeling tired      . levothyroxine (SYNTHROID, LEVOTHROID) 112 MCG tablet Take 112 mcg by mouth every morning.      Marland Kitchen lisinopril (PRINIVIL,ZESTRIL) 5 MG tablet Take 5 mg by mouth daily.      . meloxicam (MOBIC) 15 MG tablet Take 15 mg by mouth daily as needed. For joint pain.      . mometasone (NASONEX) 50 MCG/ACT nasal spray Place 2 sprays into the nose daily as needed. For allergies      . Omega-3 Fatty Acids (FISH OIL PO) Take 1 capsule by mouth daily.      Marland Kitchen triamcinolone (KENALOG) 0.1 % paste       . triamcinolone cream (KENALOG) 0.1 % Apply topically 2 (two) times daily.      . Cholecalciferol (VITAMIN D PO) Take 1 tablet by mouth daily.      Marland Kitchen HYDROcodone-acetaminophen (NORCO/VICODIN) 5-325 MG per tablet Take 1-2 tablets by mouth every 4 (four) hours as needed for pain.  50 tablet  1    REVIEW OF SYSTEMS:  A 15 point review of systems is documented in the electronic medical record. This was obtained by the nursing staff. However, I reviewed this with the patient to discuss relevant findings and make appropriate changes.  Pertinent items are noted in HPI.   PHYSICAL EXAM:  height is 5\' 5"  (1.651 m) and weight is 141 lb 6.4 oz (64.139 kg). Her oral temperature is 98.8 F (37.1 C). Her blood pressure is 121/59 and her pulse is 74. Her respiration is 18 and oxygen saturation is 100%.   . She is a pleasant female in no distress sitting him down examined table. She is appears her stated age. She has no palpable cervical or subclavicular adenopathy bilaterally. She has an incision in the right breast and absence of an area left. There is no sign of infection. There is some edema within the breast. She has an incision in her right axilla which is also healing well with no evidence of infection.  LABORATORY DATA:  Lab Results  Component Value Date   WBC 9.7 07/09/2012     HGB 13.7 07/09/2012   HCT 38.9 07/09/2012   MCV 85.9 07/09/2012   PLT 253 07/09/2012   Lab Results  Component Value Date   NA 141 07/09/2012   K 3.8 07/09/2012   CL 106 07/09/2012   CO2 28 07/09/2012   Lab Results  Component Value Date   ALT 20 07/09/2012   AST 24 07/09/2012   ALKPHOS 77 07/09/2012   BILITOT 0.2* 07/09/2012     RADIOGRAPHY: Chest 2 View  07/09/2012  *RADIOLOGY REPORT*  Clinical Data: Preoperative examination for right breast lumpectomy.  History of myocardial infarction.  Hypertension and smoking history.  CHEST -  2 VIEW  Comparison: 11/22/2006 to  Findings: The heart is mildly enlarged.  Mediastinal shadows are otherwise normal.  There are coronary artery stents.  The vascularity is normal.  Lungs are clear.  No effusions.  No bony abnormality.  IMPRESSION: Mild cardiomegaly.  Coronary artery stents.  No active disease evident.   Original Report Authenticated By: Paulina Fusi, M.D.    Mr Breast Bilateral W Wo Contrast  06/23/2012  *RADIOLOGY REPORT*  Clinical Data: Recent diagnosis of invasive ductal carcinoma and DCIS, grade 1, following ultrasound-guided biopsy of a 5 mm subareolar mass at 5 o'clock position in the right breast. The patient initially presented with a palpable mass.  BUN and creatinine were obtained on site at [facility] at [address] Results:  BUN 6 mg/dL,  Creatinine 0.9 mg/dL.  BILATERAL BREAST MRI WITH AND WITHOUT CONTRAST  Technique: Multiplanar, multisequence MR images of both breasts were obtained prior to and following the intravenous administration of 13ml of Multihance.  Three dimensional images were evaluated at the independent DynaCad workstation.  Comparison:  Diagnostic right mammogram right breast ultrasound 06/10/2012, right breast biopsy and post clip mammogram 06/17/2012, and mammogram of the left breast 06/22/2012.  Findings: There is a mild background parenchymal enhancement pattern bilaterally.  In the subareolar right breast,  five o'clock position, is a superficially positioned small irregularly shaped mass with central biopsy clip artifact and some surrounding edema. The biopsy-proven malignancy measures approximately 5 x 4 x 5 mm. No additional suspicious areas of enhancement are identified in the right breast.  There is slight focal skin thickening and skin edema directly overlying the mass.  The previously described focal dermal lesion at the time ultrasound on 06/10/2012 within the areola at 5 o'clock position of the right breast is not discretely detected on MRI.  Question if this sonographically detected  mass could be within the area of focal skin thickening seen on MRI.  No mass or suspicious enhancement is identified in the left breast to suggest malignancy.  Negative for axillary or internal mammary chain lymphadenopathy.  IMPRESSION:  1.  Solitary 5 mm enhancing mass in the subareolar right breast five o'clock position corresponds to the biopsy-proven malignancy. There is focal skin thickening directly overlying the mass. 2.  No evidence of malignancy in the left breast. 3.  Negative for lymphadenopathy.  RECOMMENDATION: Surgical planning  THREE-DIMENSIONAL MR IMAGE RENDERING ON INDEPENDENT WORKSTATION:  Three-dimensional MR images were rendered by post-processing of the original MR data on an independent workstation.  The three- dimensional MR images were interpreted, and findings were reported in the accompanying complete MRI report for this study.  BI-RADS CATEGORY 6:  Known biopsy-proven malignancy - appropriate action should be taken.   Original Report Authenticated By: Britta Mccreedy, M.D.    Nm Sentinel Node Inj-no Rpt (breast)  07/15/2012  CLINICAL DATA: Cancer right breast   Sulfur colloid was injected intradermally by the nuclear medicine  technologist for breast cancer sentinel node localization.     [place] Wire Localization Right  07/15/2012  *RADIOLOGY REPORT*  Clinical Data:  Recently diagnosed cancer at 5  o'clock in the right subareolar region.  RIGHT BREAST NEEDLE LOCALIZATION USING ULTRASOUND GUIDANCE AND SPECIMEN RADIOGRAPH  Patient presents for needle localization prior to surgical excision. The patient and I discussed the procedure of needle localization including benefits and alternatives. We discussed the high likelihood of a successful procedure. We discussed the risks of the procedure, including infection, bleeding, tissue injury, and further surgery. Informed  written consent was given.  Using ultrasound guidance, sterile technique, 2% lidocaine, and a 5 cm modified Kopans needle, the mass at 5 o'clock in the subareolar region localized using a mediolateral approach. Films were labeled and sent with the patient to surgery.  She tolerated procedure well.  Specimen radiograph is performed at [facility] operating room and confirms the mass, clip, and wire to be present in the tissue sample.  The specimen is marked for pathology.  IMPRESSION: Needle localization right breast.  No apparent complications.   Original Report Authenticated By: Cain Saupe, M.D.    Mm Breast Surgical Specimen  07/15/2012  *RADIOLOGY REPORT*  Clinical Data:  Recently diagnosed cancer at 5 o'clock in the right subareolar region.  RIGHT BREAST NEEDLE LOCALIZATION USING ULTRASOUND GUIDANCE AND SPECIMEN RADIOGRAPH  Patient presents for needle localization prior to surgical excision. The patient and I discussed the procedure of needle localization including benefits and alternatives. We discussed the high likelihood of a successful procedure. We discussed the risks of the procedure, including infection, bleeding, tissue injury, and further surgery. Informed written consent was given.  Using ultrasound guidance, sterile technique, 2% lidocaine, and a 5 cm modified Kopans needle, the mass at 5 o'clock in the subareolar region localized using a mediolateral approach. Films were labeled and sent with the patient to surgery.   She tolerated procedure well.  Specimen radiograph is performed at [facility] operating room and confirms the mass, clip, and wire to be present in the tissue sample.  The specimen is marked for pathology.  IMPRESSION: Needle localization right breast.  No apparent complications.   Original Report Authenticated By: Cain Saupe, M.D.    Mm Digital Diagnostic Unilat L  06/22/2012  *RADIOLOGY REPORT*  Clinical Data:  Recent diagnosis of right breast cancer.  Pre breast MRI left mammogram.  DIGITAL DIAGNOSTIC LEFT MAMMOGRAM WITH CAD  Comparison:  June 17, 2012, June 11, 2012, October 22, 2011, September 12, 2010  Findings:  CC and MLO views of the left breast and spot compression CC and MLO views of the left breast are submitted.  There is questioned asymmetry in the left breast which does not persist on spot compression views. Mammographic images were processed with CAD.  IMPRESSION: Negative in the left breast.  Known right breast cancer.  RECOMMENDATION: Surgical consultation and MRI of the breasts.  I have discussed the findings and recommendations with the patient. Results were also provided in writing at the conclusion of the visit.  BI-RADS CATEGORY 6:  Known biopsy-proven malignancy - appropriate action should be taken.   Original Report Authenticated By: Sherian Rein, M.D.       IMPRESSION: [id number] Invasive ductal carcinoma of the right breast  PLAN: I discussed with the patient the indications for radiation. We discussed the results of randomized trial showing a decrease in local failure in patients who undergo radiation after lumpectomy. We discussed that there was no survival benefit seen in patients who underwent a mastectomy compared to patients who underwent breast conservation. We discussed the process of simulation the placement tattoos. We discussed the possibility of rib and lung damage. We discussed the possibility of lung cancer secondary to her continued smoking. We  discussed the possible side effects of treatment which she wrote down and include skin darkening which can be permanent, at edema, fatigue and irritated skin. I spoke with her orthopedic surgeon and explained the process and procedures that would be required of her to undergo radiation. He thought she  could easily be ready to do this 2-3 weeks after her surgery. For this reason I have told her that it would be fine to delay her radiation treatments until the middle of January. We will schedule her for simulation in followup then. I've asked her to contact me if she decides that she is not ready for daily treatments. We discussed also her smoking cessation and she has a plan for this. She has a prescription for Chantix and is planning on quitting while she is in the hospital for her hip replacement. She had discussions about weight lifting returning to exercise which I've asked her to contact Dr. Derrell Lolling about.  I spent 60 minutes  face to face with the patient and more than 50% of that time was spent in counseling and/or coordination of care.   ------------------------------------------------  Lurline Hare, MD

## 2012-07-21 NOTE — Progress Notes (Signed)
Complete PATIENT MEASURE OF DISTRESS worksheet with a score of 1 submitted to social work. 

## 2012-07-21 NOTE — Addendum Note (Signed)
Encounter addended by: Delynn Flavin, RN on: 07/21/2012  5:59 PM<BR>     Documentation filed: Charges VN

## 2012-07-21 NOTE — Progress Notes (Signed)
Patient came in with financial application. Family of 1 income- [phone](this is her income only). She has an IRA also. She is overqualified. I will send letter to patient. She will need to set arrangements on future bills.

## 2012-07-21 NOTE — Progress Notes (Signed)
51 year old female.  Invasive ductal carcinoma right breast, ER/PR positive, HER 2 negative. Right central lumpectomy with needle localization, injection blue dye right breast, right axillary sentinel node biopsy done by Dr. Mikey Bussing on 07/15/2012. Node negative.   PCP Dr. Merri Brunette AX: Tramadol and ACE inhibitor No hx of radiation therapy No indication of a pacemaker

## 2012-07-26 ENCOUNTER — Encounter: Payer: Self-pay | Admitting: Oncology

## 2012-07-26 ENCOUNTER — Ambulatory Visit (HOSPITAL_BASED_OUTPATIENT_CLINIC_OR_DEPARTMENT_OTHER): Payer: BC Managed Care – PPO | Admitting: Oncology

## 2012-07-26 ENCOUNTER — Telehealth (INDEPENDENT_AMBULATORY_CARE_PROVIDER_SITE_OTHER): Payer: Self-pay | Admitting: General Surgery

## 2012-07-26 ENCOUNTER — Telehealth: Payer: Self-pay | Admitting: Oncology

## 2012-07-26 VITALS — BP 128/75 | HR 71 | Temp 98.9°F | Resp 20 | Ht 65.0 in | Wt 143.2 lb

## 2012-07-26 DIAGNOSIS — Z170 Estrogen receptor positive status [ER+]: Secondary | ICD-10-CM

## 2012-07-26 DIAGNOSIS — C50319 Malignant neoplasm of lower-inner quadrant of unspecified female breast: Secondary | ICD-10-CM

## 2012-07-26 NOTE — Progress Notes (Signed)
OFFICE PROGRESS NOTE  CC  Allean Found, MD [address] [place] [address] Dr. Claud Kelp Dr. Lavell Luster Dr. Gunnar Bulla  DIAGNOSIS: 51 year old female with stage I invasive ductal carcinoma of the right breast status post central lumpectomy with sentinel lymph node biopsy on 07/15/2012.  PRIOR THERAPY:  #1 patient was seen originally by me on 06/30/2012 with new diagnosis of a 5 mm mass in the right breast at the 5:00 position. The biopsy showed invasive ductal carcinoma with associated DCIS tumor was ER +100% PR +60% Ki-67 6% and HER-2/neu negative. It was grade 1.  #2 patient has gone on to have a central lumpectomy on 07/15/2012 the final pathology revealed a 0.6 cm ER positive PR positive HER-2/neu negative low grade invasive ductal carcinoma. Sentinel node was negative for metastatic disease. Postoperatively she is doing well without any problems.  #3 patient is also scheduled to have left hip surgery performed on 08/17/2012. She is cleared from my perspective to proceed with this first.  #4 once she has her surgery she will be seen by Dr. Lurline Hare who will do radiation therapy adjuvantly to the right breast.  CURRENT THERAPY: Patient will proceed with surgery then radiation therapy.  INTERVAL HISTORY: Amber [name] 51 y.o. female returns for followup visit post lumpectomy overall she is doing well she is without any significant complaints. She denies any nausea vomiting fevers chills. Overall she tolerated her surgery well. She and I have discussed her final pathology.  MEDICAL HISTORY: Past Medical History  Diagnosis Date  . Arthritis   . Hyperlipidemia   . Osteoporosis   . Thyroid disease   . Breast cancer   . Heart attack 2001    Dr. Isabel Caprice  . Hypertension     [facility]  . GERD (gastroesophageal reflux disease)     hx of   . Headache   . Neuromuscular disorder     carpal tunnel right hand  . Anemia    pernicious anemia  . Chronic neck pain     hx of    ALLERGIES:  is allergic to ace inhibitors and tramadol.  MEDICATIONS:  Current Outpatient Prescriptions  Medication Sig Dispense Refill  . alendronate (FOSAMAX) 70 MG tablet Take 70 mg by mouth every 7 (seven) days. Take with a full glass of water on an empty stomach. Saturday      . aspirin EC 81 MG tablet Take 81 mg by mouth at bedtime.      Marland Kitchen atenolol (TENORMIN) 25 MG tablet Take 25 mg by mouth daily.      Marland Kitchen atorvastatin (LIPITOR) 80 MG tablet Take 80 mg by mouth at bedtime.      Marland Kitchen BIOTIN PO Take 2 tablets by mouth daily.      . Cholecalciferol (VITAMIN D PO) Take 1 tablet by mouth daily.      . Coenzyme Q10 (COQ10 PO) Take 1 tablet by mouth daily.      . cyanocobalamin (,VITAMIN B-12,) 1000 MCG/ML injection Inject 1,000 mcg into the muscle every 30 (thirty) days. As needed when feeling tired      . cyclobenzaprine (FLEXERIL) 10 MG tablet       . ezetimibe (ZETIA) 10 MG tablet Take 10 mg by mouth daily.      Marland Kitchen GLUCOSAMINE-CHONDROITIN PO Take 1 tablet by mouth daily.      Marland Kitchen HYDROcodone-acetaminophen (NORCO/VICODIN) 5-325 MG per tablet Take 1-2 tablets by mouth every 4 (four) hours as needed  for pain.  50 tablet  1  . hydrOXYzine (ATARAX/VISTARIL) 25 MG tablet       . IRON PO Take 1 tablet by mouth at bedtime as needed. When feeling tired      . levothyroxine (SYNTHROID, LEVOTHROID) 112 MCG tablet Take 112 mcg by mouth every morning.      Marland Kitchen lisinopril (PRINIVIL,ZESTRIL) 5 MG tablet Take 5 mg by mouth daily.      . meloxicam (MOBIC) 15 MG tablet Take 15 mg by mouth daily as needed. For joint pain.      . mometasone (NASONEX) 50 MCG/ACT nasal spray Place 2 sprays into the nose daily as needed. For allergies      . Omega-3 Fatty Acids (FISH OIL PO) Take 1 capsule by mouth daily.      Marland Kitchen triamcinolone (KENALOG) 0.1 % paste       . triamcinolone cream (KENALOG) 0.1 % Apply topically 2 (two) times daily.        SURGICAL HISTORY:  Past  Surgical History  Procedure Date  . Nasal sinus surgery 1990  . Coronary angioplasty     stent placement  2001  . Partial mastectomy with needle localization and axillary sentinel lymph node bx 07/15/2012    Procedure: PARTIAL MASTECTOMY WITH NEEDLE LOCALIZATION AND AXILLARY SENTINEL LYMPH NODE BX;  Surgeon: Ernestene Mention, MD;  Location: [facility] OR;  Service: General;  Laterality: Right;  . Breast biopsy     REVIEW OF SYSTEMS:  Pertinent items are noted in HPI.   HEALTH MAINTENANCE:  PHYSICAL EXAMINATION: Blood pressure 128/75, pulse 71, temperature 98.9 F (37.2 C), temperature source Oral, resp. rate 20, height 5\' 5"  (1.651 m), weight 143 lb 3.2 oz (64.955 kg). Body mass index is 23.83 kg/(m^2). ECOG PERFORMANCE STATUS: 1 - Symptomatic but completely ambulatory   General appearance: alert, cooperative and appears stated age Neck: no adenopathy, no carotid bruit, no JVD, supple, symmetrical, trachea midline and thyroid not enlarged, symmetric, no tenderness/mass/nodules Lymph nodes: Cervical, supraclavicular, and axillary nodes normal. Resp: clear to auscultation bilaterally Back: symmetric, no curvature. ROM normal. No CVA tenderness. Cardio: regular rate and rhythm GI: soft, non-tender; bowel sounds normal; no masses,  no organomegaly Extremities: extremities normal, atraumatic, no cyanosis or edema Neurologic: Grossly normal Right breast reveals a well-healed central lumpectomy scar there is no redness or erythema.  LABORATORY DATA: Lab Results  Component Value Date   WBC 9.7 07/09/2012   HGB 13.7 07/09/2012   HCT 38.9 07/09/2012   MCV 85.9 07/09/2012   PLT 253 07/09/2012      Chemistry      Component Value Date/Time   NA 141 07/09/2012 1555   NA 139 06/30/2012 1437   K 3.8 07/09/2012 1555   K 4.2 06/30/2012 1437   CL 106 07/09/2012 1555   CL 106 06/30/2012 1437   CO2 28 07/09/2012 1555   CO2 28 06/30/2012 1437   BUN 9 07/09/2012 1555   BUN 11.0 06/30/2012 1437    CREATININE 0.72 07/09/2012 1555   CREATININE 0.8 06/30/2012 1437      Component Value Date/Time   CALCIUM 10.1 07/09/2012 1555   CALCIUM 10.5* 06/30/2012 1437   ALKPHOS 77 07/09/2012 1555   ALKPHOS 84 06/30/2012 1437   AST 24 07/09/2012 1555   AST 24 06/30/2012 1437   ALT 20 07/09/2012 1555   ALT 26 06/30/2012 1437   BILITOT 0.2* 07/09/2012 1555   BILITOT 0.26 06/30/2012 1437    Diagnosis 1. Lymph node, sentinel,  biopsy, Right axillary - THERE IS NO EVIDENCE OF CARCINOMA IN 1 OF 1 LYMPH NODE (0/1). 2. Lymph node, sentinel, biopsy, Right axillary - THERE IS NO EVIDENCE OF CARCINOMA IN 1 OF 1 LYMPH NODE (0/1). 3. Breast, partial mastectomy, Right - INVASIVE DUCTAL CARCINOMA, GRADE I/III, SPANNING 0.6 CM. - THE SURGICAL RESECTION MARGINS ARE NEGATIVE FOR CARCINOMA. - SEE ONCOLOGY TABLE BELOW. Microscopic Comment 3. BREAST, INVASIVE TUMOR, WITH LYMPH NODE SAMPLING Specimen, including laterality: Right breast. Procedure: Mastectomy. Grade: I Tubule formation: 1 Nuclear pleomorphism: 2 Mitotic:1 Tumor size (gross measurement): 0.6 cm Margins: Negative for carcinoma Invasive, distance to closest margin: 2.0 cm to the posterior margin (gross measurement) Lymphovascular invasion: Not identified. Ductal carcinoma in situ: Not identified. Lobular neoplasia: Not identified. Tumor focality: Unifocal Treatment effect: N/A Extent of tumor: Confined to breast parenchyma. Lymph nodes: # examined: 2 Lymph nodes with metastasis: 0 Breast prognostic profile: [phone] 1 of 3 FINAL for KENNAH, HEHR ([id number]) Microscopic Comment(continued) Estrogen receptor: 100%, strong staining intensity. Progesterone receptor: 60%, strong staining intensity. Her 2 neu: No amplification was detected. The ratio was 1.11. Her 2 neu by [facility] will be repeated on the current case and the results reported separately. Ki-67: 6%. Non-neoplastic breast: Adenosis with calcifications, fibrocystic changes,  and healing biopsy site. TNM: pT1b, pN0 (JBK:gt, 07/19/12) Pecola Leisure MD Pathologist, Electronic Signature (Case signed 07/19/2012) Specimen Gross and Clinical Information Specimen(s) Obtained: 1. Lymph node, sentinel, biopsy, Right axillary 2. Lymph node, sentinel, biopsy, Right axillary 3. Breast, partial mastectomy, Right Specimen Clinical Information   RADIOGRAPHIC STUDIES:  Chest 2 View  07/09/2012  *RADIOLOGY REPORT*  Clinical Data: Preoperative examination for right breast lumpectomy.  History of myocardial infarction.  Hypertension and smoking history.  CHEST - 2 VIEW  Comparison: 11/22/2006 to  Findings: The heart is mildly enlarged.  Mediastinal shadows are otherwise normal.  There are coronary artery stents.  The vascularity is normal.  Lungs are clear.  No effusions.  No bony abnormality.  IMPRESSION: Mild cardiomegaly.  Coronary artery stents.  No active disease evident.   Original Report Authenticated By: Paulina Fusi, M.D.    Nm Sentinel Node Inj-no Rpt (breast)  07/15/2012  CLINICAL DATA: Cancer right breast   Sulfur colloid was injected intradermally by the nuclear medicine  technologist for breast cancer sentinel node localization.     [place] Wire Localization Right  07/15/2012  *RADIOLOGY REPORT*  Clinical Data:  Recently diagnosed cancer at 5 o'clock in the right subareolar region.  RIGHT BREAST NEEDLE LOCALIZATION USING ULTRASOUND GUIDANCE AND SPECIMEN RADIOGRAPH  Patient presents for needle localization prior to surgical excision. The patient and I discussed the procedure of needle localization including benefits and alternatives. We discussed the high likelihood of a successful procedure. We discussed the risks of the procedure, including infection, bleeding, tissue injury, and further surgery. Informed written consent was given.  Using ultrasound guidance, sterile technique, 2% lidocaine, and a 5 cm modified Kopans needle, the mass at 5 o'clock in the subareolar region  localized using a mediolateral approach. Films were labeled and sent with the patient to surgery.  She tolerated procedure well.  Specimen radiograph is performed at [facility] operating room and confirms the mass, clip, and wire to be present in the tissue sample.  The specimen is marked for pathology.  IMPRESSION: Needle localization right breast.  No apparent complications.   Original Report Authenticated By: Cain Saupe, M.D.    Mm Breast Surgical Specimen  07/15/2012  *RADIOLOGY REPORT*  Clinical  Data:  Recently diagnosed cancer at 5 o'clock in the right subareolar region.  RIGHT BREAST NEEDLE LOCALIZATION USING ULTRASOUND GUIDANCE AND SPECIMEN RADIOGRAPH  Patient presents for needle localization prior to surgical excision. The patient and I discussed the procedure of needle localization including benefits and alternatives. We discussed the high likelihood of a successful procedure. We discussed the risks of the procedure, including infection, bleeding, tissue injury, and further surgery. Informed written consent was given.  Using ultrasound guidance, sterile technique, 2% lidocaine, and a 5 cm modified Kopans needle, the mass at 5 o'clock in the subareolar region localized using a mediolateral approach. Films were labeled and sent with the patient to surgery.  She tolerated procedure well.  Specimen radiograph is performed at [facility] operating room and confirms the mass, clip, and wire to be present in the tissue sample.  The specimen is marked for pathology.  IMPRESSION: Needle localization right breast.  No apparent complications.   Original Report Authenticated By: Cain Saupe, M.D.     ASSESSMENT: 51 year old female with  #1 stage I invasive ductal carcinoma of the right breast ER positive PR positive HER-2/neu negative with a low Ki-67 grade 1. Status post central lumpectomy with sentinel lymph node biopsy. Lymph nodes were negative for metastatic disease.  Postoperatively patient is doing well.  #2 patient with significant arthritis awaiting hip replacement in December.  #3 patient will eventually need adjuvant radiation and antiestrogen therapy this will be done after the hip surgery. She will first have her radiation and then when she completes this we will proceed with antiestrogen therapy with either tamoxifen or an aromatase inhibitor.   PLAN:   #1 proceed with hip surgery.  #2 I will plan on seeing her back in February after she completes her radiation therapy.   All questions were answered. The patient knows to call the clinic with any problems, questions or concerns. We can certainly see the patient much sooner if necessary.  I spent 25 minutes counseling the patient face to face. The total time spent in the appointment was 30 minutes.    Drue Second, MD Medical/Oncology [facility] [phone] (beeper) [phone] (Office)  07/26/2012, 3:32 PM

## 2012-07-26 NOTE — Telephone Encounter (Signed)
gve the pt her feb 2014 appt calendar °

## 2012-07-26 NOTE — Patient Instructions (Addendum)
Proceed with hip surgery  Radiation therapy after the surgery  I will see you back in February

## 2012-07-26 NOTE — Telephone Encounter (Signed)
Called patient back and advise she wait until she sees Dr. Derrell Lolling post op next week on 08/02/12 before starting any exercise. Patient agreed.

## 2012-08-02 ENCOUNTER — Ambulatory Visit (INDEPENDENT_AMBULATORY_CARE_PROVIDER_SITE_OTHER): Payer: BC Managed Care – PPO | Admitting: General Surgery

## 2012-08-02 ENCOUNTER — Encounter (INDEPENDENT_AMBULATORY_CARE_PROVIDER_SITE_OTHER): Payer: BC Managed Care – PPO | Admitting: General Surgery

## 2012-08-02 ENCOUNTER — Encounter (INDEPENDENT_AMBULATORY_CARE_PROVIDER_SITE_OTHER): Payer: Self-pay | Admitting: General Surgery

## 2012-08-02 VITALS — BP 110/70 | HR 60 | Temp 99.0°F | Resp 18 | Ht 65.0 in | Wt 141.0 lb

## 2012-08-02 DIAGNOSIS — C50319 Malignant neoplasm of lower-inner quadrant of unspecified female breast: Secondary | ICD-10-CM

## 2012-08-02 NOTE — Patient Instructions (Signed)
The incisionsn your right breast and in your right axilla are healing normally. There is no sign of infection or any surgical complications.  You may resume normal physical activities without restriction. You may wear whatever clothing is comfortable.  I agree with your plans for hip replacement, then to be followed by radiation therapy.  Dr. Welton Flakes will discuss antiestrogen therapy with you at some point in the future.  Return to see Dr. Derrell Lolling in May of 2014.

## 2012-08-02 NOTE — Progress Notes (Addendum)
Patient ID: Amber [name], female   DOB: [birth year], 51 y.o.   MRN: [id number] History: This patient returns for followup regarding her right breast cancer. On 07/15/2012 she underwent a right central lumpectomy and right axillary sentinel node biopsy. Pathology reveals receptor positive, HER-2-negative, low proliferation index, 6 mm tumor under the areola, pathologic stage T1b., N0. Margins are negative. She is healing uneventfully. She has no complaints about her wounds. She has no complaints about her arm. No swelling. No sensory deficit. No limitation of range of motion. She plans to proceed with her hip replacement with Dr. Jerl Santos on  December 17. She has seen Dr. Michell Heinrich and plans to begin radiation therapy sometime in January. This will probably be followed by antiestrogen therapy.  Exam: Patient was well. No distress. Right breast central lumpectomy incision healing without any wound complications, fluid or infection. Right axillary wound well healed. Range of motion right shoulder is 100%. No arm swelling or sensory deficit  Assessment invasive carcinoma right breast, subareolar, pathologic stage T1b., N0, receptor positive, HER-2-negative. Recovering uneventfully 3 weeks following right central lumpectomy and sentinel node biopsy  Plan: Proceed with hip replacement, to be followed by radiation therapy to the right breast. Return to see me in May 2014.    Angelia Mould. Derrell Lolling, M.D., [facility] General and Minimally invasive Surgery Breast and Colorectal Surgery Office:   [phone] Pager:   [phone]

## 2012-08-03 ENCOUNTER — Other Ambulatory Visit: Payer: Self-pay | Admitting: Orthopaedic Surgery

## 2012-08-10 ENCOUNTER — Encounter (HOSPITAL_COMMUNITY)
Admission: RE | Admit: 2012-08-10 | Discharge: 2012-08-10 | Disposition: A | Discharge status: Home / Self Care | Payer: BC Managed Care – PPO | Source: Ambulatory Visit | Attending: Orthopaedic Surgery | Admitting: Orthopaedic Surgery

## 2012-08-10 ENCOUNTER — Encounter (HOSPITAL_COMMUNITY): Payer: Self-pay

## 2012-08-10 ENCOUNTER — Encounter (HOSPITAL_COMMUNITY): Payer: Self-pay | Admitting: Respiratory Therapy

## 2012-08-10 ENCOUNTER — Inpatient Hospital Stay (HOSPITAL_COMMUNITY): Admission: RE | Admit: 2012-08-10 | Payer: BC Managed Care – PPO | Source: Ambulatory Visit

## 2012-08-10 HISTORY — DX: Hypothyroidism, unspecified: E03.9

## 2012-08-10 LAB — TYPE AND SCREEN
ABO/RH(D): B POS
Antibody Screen: NEGATIVE

## 2012-08-10 LAB — BASIC METABOLIC PANEL
Chloride: 107 mEq/L (ref 96–112)
GFR calc Af Amer: >90 mL/min (ref >90)
GFR calc non Af Amer: >90 mL/min (ref >90)
Glucose, Bld: 85 mg/dL (ref 70–99)
Potassium: 4 mEq/L (ref 3.5–5.1)
Sodium: 142 mEq/L (ref 135–145)

## 2012-08-10 LAB — PROTIME-INR: INR: 0.91 (ref 0.00–1.49)

## 2012-08-10 LAB — SURGICAL PCR SCREEN: MRSA, PCR: NEGATIVE

## 2012-08-10 LAB — CBC WITH DIFFERENTIAL/PLATELET
HCT: 37.5 % (ref 36.0–46.0)
Hemoglobin: 13.2 g/dL (ref 12.0–15.0)
Lymphs Abs: 2.6 10*3/uL (ref 0.7–4.0)
Monocytes Relative: 7 % (ref 3–12)
Neutro Abs: 5 10*3/uL (ref 1.7–7.7)
Neutrophils Relative %: 57 % (ref 43–77)
RBC: 4.39 MIL/uL (ref 3.87–5.11)

## 2012-08-10 LAB — URINALYSIS, ROUTINE W REFLEX MICROSCOPIC
Ketones, ur: NEGATIVE mg/dL
Leukocytes, UA: NEGATIVE
Nitrite: NEGATIVE
Protein, ur: NEGATIVE mg/dL
Urobilinogen, UA: 0.2 mg/dL (ref 0.0–1.0)

## 2012-08-10 NOTE — Progress Notes (Signed)
Primary Physician - Dr. Merri Brunette Cardiologist - Dr. Eldridge Dace November ekg, chest xray - in epic

## 2012-08-10 NOTE — Pre-Procedure Instructions (Signed)
[address] Cit[address] LAMEISHA SCHUENEMANN  08/10/2012   Your procedure is scheduled on:  Tuesday, December 17th  Report to [facility] at 0530 AM.  Call this number if you have problems the morning of surgery: [phone]   Remember:   Do not eat food or drink:After Midnight.   Take these medicines the morning of surgery with A SIP OF WATER: atenolol, vicodin if needed, synthroid, nasonex   Do not wear jewelry, make-up or nail polish.  Do not wear lotions, powders, or perfumes.  Do not shave 48 hours prior to surgery.   Do not bring valuables to the hospital.  Contacts, dentures or bridgework may not be worn into surgery.  Leave suitcase in the car. After surgery it may be brought to your room.  For patients admitted to the hospital, checkout time is 11:00 AM the day of discharge.   Patients discharged the day of surgery will not be allowed to drive home.    Special Instructions: Shower using CHG 2 nights before surgery and the night before surgery.  If you shower the day of surgery use CHG.  Use special wash - you have one bottle of CHG for all showers.  You should use approximately 1/3 of the bottle for each shower.   Please read over the following fact sheets that you were given: Pain Booklet, Coughing and Deep Breathing, Blood Transfusion Information, MRSA Information and Surgical Site Infection Prevention

## 2012-08-10 NOTE — Progress Notes (Signed)
08/10/12 1131  OBSTRUCTIVE SLEEP APNEA  Do you snore loudly (loud enough to be heard through closed doors)?  1  Do you often feel tired, fatigued, or sleepy during the daytime? 1  Has anyone observed you stop breathing during your sleep? 0  Do you have, or are you being treated for high blood pressure? 1  BMI more than 35 kg/m2? 0  Age over 53 years old? 1  Neck circumference greater than 40 cm/18 inches? 0  Gender: 0  Obstructive Sleep Apnea Score 4   Score 4 or greater  Results sent to PCP

## 2012-08-11 NOTE — H&P (Signed)
TOTAL HIP ADMISSION H&P  Patient is admitted for right total hip arthroplasty.  Subjective:  Chief Complaint: right hip pain  HPI: Amber [name], 51 y.o. female, has a history of pain and functional disability in the right hip(s) due to arthritis and patient has failed non-surgical conservative treatments for greater than 12 weeks to include NSAID's and/or analgesics, corticosteriod injections, flexibility and strengthening excercises, use of assistive devices, weight reduction as appropriate and activity modification.  Onset of symptoms was gradual starting 4 years ago with gradually worsening course since that time.The patient noted no past surgery on the right hip(s).  Patient currently rates pain in the right hip at 8 out of 10 with activity. Patient has night pain, worsening of pain with activity and weight bearing, trendelenberg gait, pain that interfers with activities of daily living and pain with passive range of motion. Patient has evidence of subchondral sclerosis, periarticular osteophytes and joint space narrowing by imaging studies. This condition presents safety issues increasing the risk of falls. This patient has had nno previous hip surgery.  There is no current active infection.  Patient Active Problem List   Diagnosis Date Noted  . Cancer of lower-inner quadrant of female breast 06/22/2012   Past Medical History  Diagnosis Date  . Hyperlipidemia   . Osteoporosis   . Thyroid disease   . Breast cancer   . Heart attack 2001    Dr. Isabel Caprice  . Hypertension     [facility]  . GERD (gastroesophageal reflux disease)     hx of   . Neuromuscular disorder     carpal tunnel right hand  . Anemia     pernicious anemia  . Chronic neck pain     hx of  . Hypothyroidism   . Arthritis     bil hip    Past Surgical History  Procedure Date  . Nasal sinus surgery 1990  . Coronary angioplasty     stent placement  2001  . Partial mastectomy with  needle localization and axillary sentinel lymph node bx 07/15/2012    Procedure: PARTIAL MASTECTOMY WITH NEEDLE LOCALIZATION AND AXILLARY SENTINEL LYMPH NODE BX;  Surgeon: Ernestene Mention, MD;  Location: [facility] OR;  Service: General;  Laterality: Right;  . Breast biopsy     lumpectomy    No prescriptions prior to admission   Allergies  Allergen Reactions  . Tramadol     Stomach upset  . Betadine (Povidone Iodine) Rash    History  Substance Use Topics  . Smoking status: Current Some Day Smoker -- 0.5 packs/day for 20 years    Types: Cigarettes  . Smokeless tobacco: Never Used     Comment: half a/pk QD  for 20 yrs  . Alcohol Use: Yes     Comment: occasional    Family History  Problem Relation Age of Onset  . Diabetes Mother   . Heart disease Mother   . Cancer Maternal Grandmother     pancreatic  . Cancer Cousin     pancreatic     Review of Systems  Constitutional: Negative.   HENT: Negative.   Eyes: Negative.   Respiratory: Negative.   Cardiovascular: Negative.   Gastrointestinal: Negative.   Genitourinary: Negative.   Musculoskeletal: Negative.   Skin: Negative.   Neurological: Negative.   Endo/Heme/Allergies: Negative.   Psychiatric/Behavioral: Negative.     Objective:  Physical Exam  Constitutional: She appears well-nourished.  HENT:  Head: Atraumatic.  Eyes: EOM are  normal.  Neck: Neck supple.  Cardiovascular: Regular rhythm.   Respiratory: Breath sounds normal.  GI: Bowel sounds are normal.  Musculoskeletal:       Right hip exam: Range of motion is very limited in rotation.  She walks with an altered gait.  Leg lengths appear roughly equal.  Pain is significant with attempts of rotation of her hip.  Neurological: She is alert.  Skin: Skin is dry.  Psychiatric: She has a normal mood and affect.    Vital signs in last 24 hours:    Labs:   There is no height or weight on file to calculate BMI.   Imaging Review Plain radiographs demonstrate  severe degenerative joint disease of the right hip(s). The bone quality appears to be good for age and reported activity level.  Assessment/Plan:  End stage arthritis, right hip(s)  The patient history, physical examination, clinical judgement of the provider and imaging studies are consistent with end stage degenerative joint disease of the right hip(s) and total hip arthroplasty is deemed medically necessary. The treatment options including medical management, injection therapy, arthroscopy and arthroplasty were discussed at length. The risks and benefits of total hip arthroplasty were presented and reviewed. The risks due to aseptic loosening, infection, stiffness, dislocation/subluxation,  thromboembolic complications and other imponderables were discussed.  The patient acknowledged the explanation, agreed to proceed with the plan and consent was signed. Patient is being admitted for inpatient treatment for surgery, pain control, PT, OT, prophylactic antibiotics, VTE prophylaxis, progressive ambulation and ADL's and discharge planning.The patient is planning to be discharged to skilled nursing facility

## 2012-08-11 NOTE — Progress Notes (Signed)
Patient called today stating that she plans on starting chantix to help with quitting smoking after her surgery. She currently has the prescription and plans on starting on post-op day 3.  Instructed patient to call surgeon and discuss with him regarding timing of starting prescription.  Patient states the chantix will give her nightmares and wanted this documented as well.

## 2012-08-16 MED ORDER — CHLORHEXIDINE GLUCONATE 4 % EX LIQD: 60.0000 mL | Freq: Once | CUTANEOUS | Status: DC

## 2012-08-16 MED ORDER — CEFAZOLIN SODIUM-DEXTROSE 2-3 GM-% IV SOLR: 2.0000 g | INTRAVENOUS | Status: DC

## 2012-08-17 ENCOUNTER — Ambulatory Visit (HOSPITAL_COMMUNITY): Payer: BC Managed Care – PPO

## 2012-08-17 ENCOUNTER — Encounter (HOSPITAL_COMMUNITY): Payer: Self-pay | Admitting: Anesthesiology

## 2012-08-17 ENCOUNTER — Ambulatory Visit (HOSPITAL_COMMUNITY): Payer: BC Managed Care – PPO | Admitting: Anesthesiology

## 2012-08-17 ENCOUNTER — Encounter (HOSPITAL_COMMUNITY): Payer: Self-pay | Admitting: *Deleted

## 2012-08-17 ENCOUNTER — Encounter (HOSPITAL_COMMUNITY): Payer: Self-pay | Admitting: General Practice

## 2012-08-17 ENCOUNTER — Encounter (HOSPITAL_COMMUNITY)
Admission: RE | Disposition: A | Discharge status: Skilled Nursing Facility | Payer: Self-pay | Source: Ambulatory Visit | Attending: Orthopaedic Surgery

## 2012-08-17 ENCOUNTER — Inpatient Hospital Stay (HOSPITAL_COMMUNITY)
Admission: RE | Admit: 2012-08-17 | Discharge: 2012-08-19 | DRG: 818 | Disposition: A | Discharge status: Skilled Nursing Facility | Payer: BC Managed Care – PPO | Source: Ambulatory Visit | Attending: Orthopaedic Surgery | Admitting: Orthopaedic Surgery

## 2012-08-17 DIAGNOSIS — E039 Hypothyroidism, unspecified: Secondary | ICD-10-CM | POA: Diagnosis present

## 2012-08-17 DIAGNOSIS — E785 Hyperlipidemia, unspecified: Secondary | ICD-10-CM | POA: Diagnosis present

## 2012-08-17 DIAGNOSIS — M810 Age-related osteoporosis without current pathological fracture: Secondary | ICD-10-CM | POA: Diagnosis present

## 2012-08-17 DIAGNOSIS — I252 Old myocardial infarction: Secondary | ICD-10-CM

## 2012-08-17 DIAGNOSIS — Z7982 Long term (current) use of aspirin: Secondary | ICD-10-CM

## 2012-08-17 DIAGNOSIS — K219 Gastro-esophageal reflux disease without esophagitis: Secondary | ICD-10-CM | POA: Diagnosis present

## 2012-08-17 DIAGNOSIS — M1610 Unilateral primary osteoarthritis, unspecified hip: Principal | ICD-10-CM | POA: Diagnosis present

## 2012-08-17 DIAGNOSIS — Z79899 Other long term (current) drug therapy: Secondary | ICD-10-CM

## 2012-08-17 DIAGNOSIS — I10 Essential (primary) hypertension: Secondary | ICD-10-CM | POA: Diagnosis present

## 2012-08-17 DIAGNOSIS — Z9861 Coronary angioplasty status: Secondary | ICD-10-CM

## 2012-08-17 DIAGNOSIS — D649 Anemia, unspecified: Secondary | ICD-10-CM | POA: Diagnosis present

## 2012-08-17 DIAGNOSIS — Z01812 Encounter for preprocedural laboratory examination: Secondary | ICD-10-CM

## 2012-08-17 DIAGNOSIS — M169 Osteoarthritis of hip, unspecified: Principal | ICD-10-CM | POA: Diagnosis present

## 2012-08-17 DIAGNOSIS — Z853 Personal history of malignant neoplasm of breast: Secondary | ICD-10-CM

## 2012-08-17 HISTORY — DX: Sickle-cell trait: D57.3

## 2012-08-17 HISTORY — DX: Vitamin B12 deficiency anemia due to intrinsic factor deficiency: D51.0

## 2012-08-17 HISTORY — DX: Migraine, unspecified, not intractable, without status migrainosus: G43.909

## 2012-08-17 HISTORY — DX: Angina pectoris, unspecified: I20.9

## 2012-08-17 HISTORY — PX: TOTAL HIP ARTHROPLASTY: SHX124

## 2012-08-17 HISTORY — DX: Osteoarthritis of hip, unspecified: M16.9

## 2012-08-17 SURGERY — ARTHROPLASTY, HIP, TOTAL, ANTERIOR APPROACH
Anesthesia: General | Site: Hip | Laterality: Right | Wound class: Clean

## 2012-08-17 MED ORDER — METOCLOPRAMIDE HCL 5 MG/ML IJ SOLN
INTRAMUSCULAR | Status: AC
  Filled 2012-08-17: qty 2

## 2012-08-17 MED ORDER — OXYCODONE HCL 5 MG PO TABS: 5.0000 mg | ORAL_TABLET | Freq: Once | ORAL | Status: DC | PRN

## 2012-08-17 MED ORDER — CHOLECALCIFEROL 10 MCG (400 UNIT) PO TABS
400.0000 [IU] | ORAL_TABLET | Freq: Every day | ORAL | Status: DC
  Administered 2012-08-17 – 2012-08-19 (×2): 400 [IU] via ORAL
  Filled 2012-08-17 (×3): qty 1

## 2012-08-17 MED ORDER — FERROUS SULFATE 325 (65 FE) MG PO TABS
325.0000 mg | ORAL_TABLET | Freq: Every day | ORAL | Status: DC
  Filled 2012-08-17 (×3): qty 1

## 2012-08-17 MED ORDER — ALUM & MAG HYDROXIDE-SIMETH 200-200-20 MG/5ML PO SUSP: 30.0000 mL | ORAL | Status: DC | PRN

## 2012-08-17 MED ORDER — LEVOTHYROXINE SODIUM 112 MCG PO TABS
112.0000 ug | ORAL_TABLET | Freq: Every day | ORAL | Status: DC
  Administered 2012-08-18 – 2012-08-19 (×2): 112 ug via ORAL
  Filled 2012-08-17 (×3): qty 1

## 2012-08-17 MED ORDER — HYDROMORPHONE HCL PF 1 MG/ML IJ SOLN
INTRAMUSCULAR | Status: AC
  Filled 2012-08-17: qty 1

## 2012-08-17 MED ORDER — ZOLPIDEM TARTRATE 5 MG PO TABS: 5.0000 mg | ORAL_TABLET | Freq: Every evening | ORAL | Status: DC | PRN

## 2012-08-17 MED ORDER — LISINOPRIL 5 MG PO TABS
5.0000 mg | ORAL_TABLET | Freq: Every day | ORAL | Status: DC
  Administered 2012-08-18 – 2012-08-19 (×2): 5 mg via ORAL
  Filled 2012-08-17 (×3): qty 1

## 2012-08-17 MED ORDER — EZETIMIBE 10 MG PO TABS
10.0000 mg | ORAL_TABLET | Freq: Every day | ORAL | Status: DC
  Administered 2012-08-17 – 2012-08-19 (×3): 10 mg via ORAL
  Filled 2012-08-17 (×3): qty 1

## 2012-08-17 MED ORDER — HYDROCODONE-ACETAMINOPHEN 5-325 MG PO TABS
1.0000 | ORAL_TABLET | ORAL | Status: DC | PRN
  Administered 2012-08-17: 1 via ORAL
  Administered 2012-08-18: 2 via ORAL
  Administered 2012-08-19: 1 via ORAL
  Filled 2012-08-17 (×2): qty 1
  Filled 2012-08-17: qty 2

## 2012-08-17 MED ORDER — FLUTICASONE PROPIONATE 50 MCG/ACT NA SUSP
1.0000 | Freq: Every day | NASAL | Status: DC
  Administered 2012-08-18 – 2012-08-19 (×2): 1 via NASAL
  Filled 2012-08-17: qty 16

## 2012-08-17 MED ORDER — HYDROMORPHONE HCL PF 1 MG/ML IJ SOLN
0.2500 mg | INTRAMUSCULAR | Status: DC | PRN
  Administered 2012-08-17 (×4): 0.5 mg via INTRAVENOUS

## 2012-08-17 MED ORDER — METOCLOPRAMIDE HCL 10 MG PO TABS: 5.0000 mg | ORAL_TABLET | Freq: Three times a day (TID) | ORAL | Status: DC | PRN

## 2012-08-17 MED ORDER — LIDOCAINE HCL (CARDIAC) 20 MG/ML IV SOLN
INTRAVENOUS | Status: DC | PRN
  Administered 2012-08-17: 10 mg via INTRAVENOUS

## 2012-08-17 MED ORDER — ROCURONIUM BROMIDE 100 MG/10ML IV SOLN
INTRAVENOUS | Status: DC | PRN
  Administered 2012-08-17: 50 mg via INTRAVENOUS

## 2012-08-17 MED ORDER — CEFAZOLIN SODIUM-DEXTROSE 2-3 GM-% IV SOLR
INTRAVENOUS | Status: AC
  Administered 2012-08-17: 2 g via INTRAVENOUS
  Filled 2012-08-17: qty 50

## 2012-08-17 MED ORDER — LACTATED RINGERS IV SOLN: INTRAVENOUS | Status: DC

## 2012-08-17 MED ORDER — ASPIRIN EC 325 MG PO TBEC
325.0000 mg | DELAYED_RELEASE_TABLET | Freq: Two times a day (BID) | ORAL | Status: DC
  Administered 2012-08-18 – 2012-08-19 (×3): 325 mg via ORAL
  Filled 2012-08-17 (×5): qty 1

## 2012-08-17 MED ORDER — 0.9 % SODIUM CHLORIDE (POUR BTL) OPTIME
TOPICAL | Status: DC | PRN
  Administered 2012-08-17: 1000 mL

## 2012-08-17 MED ORDER — MENTHOL 3 MG MT LOZG: 1.0000 | LOZENGE | OROMUCOSAL | Status: DC | PRN

## 2012-08-17 MED ORDER — ATORVASTATIN CALCIUM 80 MG PO TABS
80.0000 mg | ORAL_TABLET | Freq: Every day | ORAL | Status: DC
  Administered 2012-08-17 – 2012-08-18 (×2): 80 mg via ORAL
  Filled 2012-08-17 (×3): qty 1

## 2012-08-17 MED ORDER — ACETAMINOPHEN 325 MG PO TABS: 650.0000 mg | ORAL_TABLET | Freq: Four times a day (QID) | ORAL | Status: DC | PRN

## 2012-08-17 MED ORDER — OXYCODONE HCL 5 MG/5ML PO SOLN: 5.0000 mg | Freq: Once | ORAL | Status: DC | PRN

## 2012-08-17 MED ORDER — DIPHENHYDRAMINE HCL 12.5 MG/5ML PO ELIX: 12.5000 mg | ORAL_SOLUTION | ORAL | Status: DC | PRN

## 2012-08-17 MED ORDER — DOCUSATE SODIUM 100 MG PO CAPS
100.0000 mg | ORAL_CAPSULE | Freq: Two times a day (BID) | ORAL | Status: DC
  Administered 2012-08-17 – 2012-08-18 (×3): 100 mg via ORAL
  Filled 2012-08-17 (×4): qty 1

## 2012-08-17 MED ORDER — DEXTROSE-NACL 5-0.2 % IV SOLN
INTRAVENOUS | Status: DC
  Administered 2012-08-17: 22:00:00 via INTRAVENOUS

## 2012-08-17 MED ORDER — PHENOL 1.4 % MT LIQD: 1.0000 | OROMUCOSAL | Status: DC | PRN

## 2012-08-17 MED ORDER — ATENOLOL 25 MG PO TABS
25.0000 mg | ORAL_TABLET | Freq: Every day | ORAL | Status: DC
  Administered 2012-08-18 – 2012-08-19 (×2): 25 mg via ORAL
  Filled 2012-08-17 (×2): qty 1

## 2012-08-17 MED ORDER — SENNOSIDES-DOCUSATE SODIUM 8.6-50 MG PO TABS: 1.0000 | ORAL_TABLET | Freq: Every evening | ORAL | Status: DC | PRN

## 2012-08-17 MED ORDER — ONDANSETRON HCL 4 MG/2ML IJ SOLN
4.0000 mg | Freq: Four times a day (QID) | INTRAMUSCULAR | Status: DC | PRN
  Administered 2012-08-17: 4 mg via INTRAVENOUS
  Filled 2012-08-17 (×2): qty 2

## 2012-08-17 MED ORDER — HYDROMORPHONE HCL PF 1 MG/ML IJ SOLN
0.5000 mg | INTRAMUSCULAR | Status: DC | PRN
  Administered 2012-08-17 (×2): 1 mg via INTRAVENOUS
  Filled 2012-08-17: qty 1

## 2012-08-17 MED ORDER — ACETAMINOPHEN 650 MG RE SUPP: 650.0000 mg | Freq: Four times a day (QID) | RECTAL | Status: DC | PRN

## 2012-08-17 MED ORDER — PROPOFOL 10 MG/ML IV BOLUS
INTRAVENOUS | Status: DC | PRN
  Administered 2012-08-17: 200 mg via INTRAVENOUS

## 2012-08-17 MED ORDER — METOCLOPRAMIDE HCL 5 MG/ML IJ SOLN
10.0000 mg | Freq: Once | INTRAMUSCULAR | Status: AC | PRN
  Administered 2012-08-17: 10 mg via INTRAVENOUS

## 2012-08-17 MED ORDER — CYCLOBENZAPRINE HCL 10 MG PO TABS: 10.0000 mg | ORAL_TABLET | Freq: Three times a day (TID) | ORAL | Status: DC | PRN

## 2012-08-17 MED ORDER — DEXAMETHASONE SODIUM PHOSPHATE 4 MG/ML IJ SOLN
INTRAMUSCULAR | Status: DC | PRN
  Administered 2012-08-17: 10 mg via INTRAVENOUS

## 2012-08-17 MED ORDER — FLEET ENEMA 7-19 GM/118ML RE ENEM: 1.0000 | ENEMA | Freq: Once | RECTAL | Status: AC | PRN

## 2012-08-17 MED ORDER — MIDAZOLAM HCL 5 MG/5ML IJ SOLN
INTRAMUSCULAR | Status: DC | PRN
  Administered 2012-08-17: 2 mg via INTRAVENOUS

## 2012-08-17 MED ORDER — ONDANSETRON HCL 4 MG/2ML IJ SOLN
INTRAMUSCULAR | Status: DC | PRN
  Administered 2012-08-17: 4 mg via INTRAVENOUS

## 2012-08-17 MED ORDER — METOCLOPRAMIDE HCL 5 MG/ML IJ SOLN: 5.0000 mg | Freq: Three times a day (TID) | INTRAMUSCULAR | Status: DC | PRN

## 2012-08-17 MED ORDER — CEFAZOLIN SODIUM-DEXTROSE 2-3 GM-% IV SOLR
2.0000 g | Freq: Four times a day (QID) | INTRAVENOUS | Status: AC
  Administered 2012-08-17 (×2): 2 g via INTRAVENOUS
  Filled 2012-08-17 (×2): qty 50

## 2012-08-17 MED ORDER — ONDANSETRON HCL 4 MG PO TABS: 4.0000 mg | ORAL_TABLET | Freq: Four times a day (QID) | ORAL | Status: DC | PRN

## 2012-08-17 MED ORDER — LACTATED RINGERS IV SOLN
INTRAVENOUS | Status: DC | PRN
  Administered 2012-08-17 (×2): via INTRAVENOUS

## 2012-08-17 MED ORDER — FENTANYL CITRATE 0.05 MG/ML IJ SOLN
INTRAMUSCULAR | Status: DC | PRN
  Administered 2012-08-17 (×5): 50 ug via INTRAVENOUS

## 2012-08-17 SURGICAL SUPPLY — 52 items
BLADE SAW SGTL 18X1.27X75 (BLADE) ×2 IMPLANT
BLADE SURG ROTATE 9660 (MISCELLANEOUS) IMPLANT
CELLS DAT CNTRL 66122 CELL SVR (MISCELLANEOUS) ×1 IMPLANT
CLOTH BEACON ORANGE TIMEOUT ST (SAFETY) ×2 IMPLANT
COVER BACK TABLE 24X17X13 BIG (DRAPES) IMPLANT
COVER SURGICAL LIGHT HANDLE (MISCELLANEOUS) ×2 IMPLANT
DRAPE C-ARM 42X72 X-RAY (DRAPES) ×2 IMPLANT
DRAPE STERI IOBAN 125X83 (DRAPES) ×1 IMPLANT
DRAPE U-SHAPE 47X51 STRL (DRAPES) ×6 IMPLANT
DRSG MEPILEX BORDER 4X12 (GAUZE/BANDAGES/DRESSINGS) IMPLANT
DRSG MEPILEX BORDER 4X8 (GAUZE/BANDAGES/DRESSINGS) ×2 IMPLANT
DURAPREP 26ML APPLICATOR (WOUND CARE) ×2 IMPLANT
ELECT BLADE 4.0 EZ CLEAN MEGAD (MISCELLANEOUS)
ELECT BLADE TIP CTD 4 INCH (ELECTRODE) ×2 IMPLANT
ELECT CAUTERY BLADE 6.4 (BLADE) ×2 IMPLANT
ELECT REM PT RETURN 9FT ADLT (ELECTROSURGICAL) ×2
ELECTRODE BLDE 4.0 EZ CLN MEGD (MISCELLANEOUS) IMPLANT
ELECTRODE REM PT RTRN 9FT ADLT (ELECTROSURGICAL) ×1 IMPLANT
FACESHIELD LNG OPTICON STERILE (SAFETY) ×4 IMPLANT
GAUZE XEROFORM 1X8 LF (GAUZE/BANDAGES/DRESSINGS) ×2 IMPLANT
GLOVE BIO SURGEON STRL SZ8 (GLOVE) ×2 IMPLANT
GLOVE BIO SURGEON STRL SZ8.5 (GLOVE) ×2 IMPLANT
GLOVE BIOGEL PI IND STRL 8 (GLOVE) ×1 IMPLANT
GLOVE BIOGEL PI IND STRL 8.5 (GLOVE) ×1 IMPLANT
GLOVE BIOGEL PI INDICATOR 8 (GLOVE) ×1
GLOVE BIOGEL PI INDICATOR 8.5 (GLOVE) ×1
GOWN PREVENTION PLUS LG XLONG (DISPOSABLE) IMPLANT
GOWN STRL NON-REIN LRG LVL3 (GOWN DISPOSABLE) ×4 IMPLANT
GOWN STRL REIN XL XLG (GOWN DISPOSABLE) ×2 IMPLANT
KIT BASIN OR (CUSTOM PROCEDURE TRAY) ×2 IMPLANT
KIT ROOM TURNOVER OR (KITS) ×2 IMPLANT
MANIFOLD NEPTUNE II (INSTRUMENTS) ×2 IMPLANT
NS IRRIG 1000ML POUR BTL (IV SOLUTION) ×2 IMPLANT
PACK TOTAL JOINT (CUSTOM PROCEDURE TRAY) ×2 IMPLANT
PAD ARMBOARD 7.5X6 YLW CONV (MISCELLANEOUS) ×4 IMPLANT
RETRACTOR WND ALEXIS 18 MED (MISCELLANEOUS) ×1 IMPLANT
RTRCTR WOUND ALEXIS 18CM MED (MISCELLANEOUS) ×2
SPONGE LAP 18X18 X RAY DECT (DISPOSABLE) ×2 IMPLANT
SPONGE LAP 4X18 X RAY DECT (DISPOSABLE) IMPLANT
STAPLER VISISTAT 35W (STAPLE) ×2 IMPLANT
SUT ETHIBOND NAB CT1 #1 30IN (SUTURE) ×4 IMPLANT
SUT VIC AB 0 CT1 27 (SUTURE)
SUT VIC AB 0 CT1 27XBRD ANBCTR (SUTURE) IMPLANT
SUT VIC AB 1 CT1 27 (SUTURE) ×2
SUT VIC AB 1 CT1 27XBRD ANBCTR (SUTURE) ×1 IMPLANT
SUT VIC AB 2-0 CT1 27 (SUTURE) ×2
SUT VIC AB 2-0 CT1 TAPERPNT 27 (SUTURE) ×1 IMPLANT
SUT VLOC 180 0 24IN GS25 (SUTURE) ×2 IMPLANT
TOWEL OR 17X24 6PK STRL BLUE (TOWEL DISPOSABLE) ×2 IMPLANT
TOWEL OR 17X26 10 PK STRL BLUE (TOWEL DISPOSABLE) ×4 IMPLANT
TRAY FOLEY CATH 14FR (SET/KITS/TRAYS/PACK) IMPLANT
WATER STERILE IRR 1000ML POUR (IV SOLUTION) ×4 IMPLANT

## 2012-08-17 NOTE — Transfer of Care (Signed)
Immediate Anesthesia Transfer of Care Note  Patient: Amber [name]  Procedure(s) Performed: Procedure(s) (LRB) with comments: TOTAL HIP ARTHROPLASTY ANTERIOR APPROACH (Right)  Patient Location: PACU  Anesthesia Type:General  Level of Consciousness: awake, alert  and oriented  Airway & Oxygen Therapy: Patient Spontanous Breathing and Patient connected to nasal cannula oxygen  Post-op Assessment: Report given to PACU RN, Post -op Vital signs reviewed and stable and Patient moving all extremities  Post vital signs: Reviewed and stable  Complications: No apparent anesthesia complications

## 2012-08-17 NOTE — Plan of Care (Signed)
Problem: Consults Goal: Diagnosis- Total Joint Replacement Primary Total Hip     

## 2012-08-17 NOTE — Progress Notes (Signed)
Orthopedic Tech Progress Note Patient Details:  Amber [name] 27-Dec-1960 [id number] Trapeze bar patient helper      Patient ID: Amber [name], female   DOB: [birth year], 51 y.o.   MRN: [id number]   [name] 08/17/2012, 1:28 PM

## 2012-08-17 NOTE — Op Note (Signed)
PRE-OP DIAGNOSIS:  RIGHT HIP DEGENERATIVE JOINT DISEASE POST-OP DIAGNOSIS:  RIGHT HIP DEGENERATIVE JOINT DISEASE PROCEDURE:  Procedure(s): RIGHT TOTAL HIP ARTHROPLASTY ANTERIOR APPROACH ANESTHESIA:  General SURGEON:  Marcene Corning MD ASSISTANT:  Lindwood Qua PA-C   INDICATIONS FOR PROCEDURE:  The patient is a 51 y.o. female with a long history of a painful hip.  This has persisted despite multiple conservative measures.  The patient has persisted with pain and dysfunction making rest and activity difficult.  A total hip replacement is offered as surgical treatment.  Informed operative consent was obtained after discussion of possible complications including reaction to anesthesia, infection, neurovascular injury, dislocation, DVT, PE, and death.  The importance of the postoperative rehab program to optimize result was stressed with the patient.  SUMMARY OF FINDINGS AND PROCEDURE:  Under general anesthesia through a anterior approach an the Hana table a right THR was performed.  The patient had severe degenerative change and excellent bone quality.  We used DePuy components to replace the hip and these were size KA12 Corail femur capped with a 36 +1.5 ceramic hip ball.  On the acetabular side we used a size 52 Gription shell with a  plus 4 neutral polyethylene liner.  We did use a hole eliminator.  Bryna Colander assisted throughout and was invaluable to the completion of the case in that he helped position and retract while I performed the procedure.  He also closed simultaneously to help minimize OR time.  I used fluoroscopy throughout the case to check position of implants and leg lengths and read all of these views myself.  DESCRIPTION OF PROCEDURE:  The patient was taken to the OR suite where general anesthetic was applied.  The patient was then positioned on the Hana table supine.  All bony prominences were appropriately padded.  Prep and drape was then performed in normal sterile fashion.  The  patient was given Kefzol preoperative antibiotic and an appropriate time out was performed.  We then took an anterior approach to the right hip.  Dissection was taken through adipose to the tensor fascia lata fascia.  This structure was incised longitudinally and we dissected in the intermuscular interval just medial to this muscle.  Cobra retractors were placed superior and inferior to the femoral neck superficial to the capsule.  A capsular incision was then made and the retractors were placed along the femoral neck.  Xray was brought in to get a good level for the femoral neck cut which was made with an oscillating saw and osteotome.  The femoral head was removed with a corkscrew.  The acetabulum was exposed and some labral tissues were excised. Reaming was taken to the inside wall of the pelvis and sequentially up to 1 mm smaller than the actual component.  A trial of components was done and then the aforementioned acetabular shell was placed in appropriate tilt and anteversion confirmed by fluoroscopy. The liner was placed along with the hole eliminator and attention was turned to the femur.  The leg was brought down and over into adduction and the elevator bar was used to raise the femur up gently in the wound.  The piriformis was released with care taken to preserve the obturator internus attachment and all of the posterior capsule. The femur was reamed and then broached to the appropriate size.  A trial reduction was done and the aforementioned head and neck assembly gave [place] the best stability in extension with external rotation.  Leg lengths were felt to be about  equal by fluoroscopic exam.  The trial components were removed and the wound irrigated.  We then placed the femoral component in appropriate anteversion.  The head was applied to a dry stem neck and the hip again reduced.  It was again stable in the aforementioned position.  The would was irrigated again followed by re-approximation of anterior  capsule with ethibond suture. Tensor fascia was repaired with V-loc suture  followed by subcutaneous closure with #O and #2 undyed vicryl.  Skin was closed with staples followed by a sterile dressing.  EBL and IOF can be obtained from anesthesia records.  DISPOSITION:  The patient was extubated in the OR and taken to PACU in stable condition to be admitted to the Orthopedic Surgery for appropriate post-op care to include perioperative antibiotics and DVT prophylaxis.

## 2012-08-17 NOTE — Anesthesia Procedure Notes (Signed)
Procedure Name: Intubation Date/Time: 08/17/2012 7:34 AM Performed by: Rossie Muskrat L Pre-anesthesia Checklist: Patient identified, Timeout performed, Emergency Drugs available, Suction available and Patient being monitored Patient Re-evaluated:Patient Re-evaluated prior to inductionOxygen Delivery Method: Circle system utilized Preoxygenation: Pre-oxygenation with 100% oxygen Intubation Type: IV induction Ventilation: Mask ventilation without difficulty Laryngoscope Size: Miller and 2 Grade View: Grade I Tube type: Oral Tube size: 7.0 mm Number of attempts: 1 Airway Equipment and Method: Stylet Placement Confirmation: ETT inserted through vocal cords under direct vision,  breath sounds checked- equal and bilateral and positive ETCO2 Secured at: 21 cm Tube secured with: Tape Dental Injury: Teeth and Oropharynx as per pre-operative assessment

## 2012-08-17 NOTE — Anesthesia Postprocedure Evaluation (Signed)
Anesthesia Post Note  Patient: Amber [name]  Procedure(s) Performed: Procedure(s) (LRB): TOTAL HIP ARTHROPLASTY ANTERIOR APPROACH (Right)  Anesthesia type: general  Patient location: PACU  Post pain: Pain level controlled  Post assessment: Patient's Cardiovascular Status Stable  Last Vitals:  Filed Vitals:   08/17/12 1030  BP: 113/57  Pulse: 52  Temp:   Resp: 21    Post vital signs: Reviewed and stable  Level of consciousness: sedated  Complications: No apparent anesthesia complications

## 2012-08-17 NOTE — Interval H&P Note (Signed)
History and Physical Interval Note:  08/17/2012 7:20 AM  Amber [name]  has presented today for surgery, with the diagnosis of RIGHT HIP DEGENERATIVE JOINT DISEASE  The various methods of treatment have been discussed with the patient and family. After consideration of risks, benefits and other options for treatment, the patient has consented to  Procedure(s) (LRB) with comments: TOTAL HIP ARTHROPLASTY ANTERIOR APPROACH (Right) as a surgical intervention .  The patient's history has been reviewed, patient examined, no change in status, stable for surgery.  I have reviewed the patient's chart and labs.  Questions were answered to the patient's satisfaction.     Jesstin Studstill G

## 2012-08-17 NOTE — Progress Notes (Signed)
Orthopedic Tech Progress Note Patient Details:  Amber [name] Nov 21, 1960 [id number]  Patient ID: [name], female   DOB: 23-Sep-1960, 51 y.o.   MRN: [id number] Viewed order from rn order list  [name] 08/17/2012, 1:28 PM

## 2012-08-17 NOTE — Preoperative (Addendum)
Beta Blockers   Reason not to administer Beta Blockers: B blocker taken 08/17/12 @ 0400

## 2012-08-17 NOTE — Anesthesia Preprocedure Evaluation (Signed)
Anesthesia Evaluation  Patient identified by MRN, date of birth, ID band Patient awake    Reviewed: Allergy & Precautions, H&P , NPO status , Patient's Chart, lab work & pertinent test results, reviewed documented beta blocker date and time   Airway Mallampati: II TM Distance: >3 FB Neck ROM: full    Dental   Pulmonary neg pulmonary ROS,  breath sounds clear to auscultation        Cardiovascular hypertension, On Medications + Past MI Rhythm:regular     Neuro/Psych  Neuromuscular disease negative psych ROS   GI/Hepatic Neg liver ROS, Medicated and Controlled,  Endo/Other  Hypothyroidism   Renal/GU negative Renal ROS  negative genitourinary   Musculoskeletal   Abdominal   Peds  Hematology  (+) anemia ,   Anesthesia Other Findings See surgeon's H&P   Reproductive/Obstetrics negative OB ROS                           Anesthesia Physical Anesthesia Plan  ASA: III  Anesthesia Plan: General   Post-op Pain Management:    Induction: Intravenous  Airway Management Planned: Oral ETT  Additional Equipment:   Intra-op Plan:   Post-operative Plan: Extubation in OR  Informed Consent: I have reviewed the patients History and Physical, chart, labs and discussed the procedure including the risks, benefits and alternatives for the proposed anesthesia with the patient or authorized representative who has indicated his/her understanding and acceptance.   Dental Advisory Given  Plan Discussed with: CRNA and Surgeon  Anesthesia Plan Comments:         Anesthesia Quick Evaluation

## 2012-08-18 ENCOUNTER — Encounter (HOSPITAL_COMMUNITY): Payer: Self-pay | Admitting: Orthopaedic Surgery

## 2012-08-18 LAB — BASIC METABOLIC PANEL
CO2: 27 mEq/L (ref 19–32)
Glucose, Bld: 129 mg/dL — ABNORMAL HIGH (ref 70–99)
Potassium: 4.1 mEq/L (ref 3.5–5.1)
Sodium: 141 mEq/L (ref 135–145)

## 2012-08-18 LAB — CBC
Hemoglobin: 10.6 g/dL — ABNORMAL LOW (ref 12.0–15.0)
RBC: 3.63 MIL/uL — ABNORMAL LOW (ref 3.87–5.11)

## 2012-08-18 NOTE — Evaluation (Signed)
Occupational Therapy Evaluation Patient Details Name: Amber [name] MRN: [id number] DOB: 1961-08-21 Today's Date: 08/18/2012 Time: 6962-9528 OT Time Calculation (min): 33 min  OT Assessment / Plan / Recommendation Clinical Impression  51 yo female s/p Rt anterior direct THA that could benefit from skilled Ot acutely. Recommend SNf for d/c planning ([facility] per patient request)    OT Assessment  Patient needs continued OT Services    Follow Up Recommendations  SNF    Barriers to Discharge      Equipment Recommendations  3 in 1 bedside comode    Recommendations for Other Services    Frequency  Min 2X/week    Precautions / Restrictions Precautions Precautions: None Precaution Comments: Direct anterior approach, Restrictions Weight Bearing Restrictions: Yes RLE Weight Bearing: Weight bearing as tolerated   Pertinent Vitals/Pain Rt hip pain 4 out 10    ADL  Eating/Feeding: Modified independent Where Assessed - Eating/Feeding: Chair Grooming: Supervision/safety (supervision due to lines) Where Assessed - Grooming: Unsupported standing Lower Body Dressing: Set up (using reacher) Where Assessed - Lower Body Dressing: Unsupported sit to stand Toilet Transfer: Min Pension scheme manager Method: Sit to Barista: Raised toilet seat with arms (or 3-in-1 over toilet) (unsafe placement of hands and min v/c) Toileting - Clothing Manipulation and Hygiene: Min guard Where Assessed - Toileting Clothing Manipulation and Hygiene: Sit to stand from 3-in-1 or toilet Equipment Used: Gait belt;Rolling walker Transfers/Ambulation Related to ADLs: pt provided min v/c for correct sequence with RW for transfers. Pt progressing well and ambulates at Min guard (A) ADL Comments: pt in restroom on arrival. pt educated on WBAT and anterior approach with no precautions. Pt completed full morning adl at sink level. Pt transfered to chair with uncontrolled descend. Pt positioned  with ice for edema management. Pt with excellent recall of ankle pumps and knee extension quad sets from pre op class.    OT Diagnosis: Generalized weakness;Acute pain  OT Problem List: Decreased strength;Impaired balance (sitting and/or standing);Decreased activity tolerance;Decreased safety awareness;Decreased knowledge of use of DME or AE;Decreased knowledge of precautions;Pain OT Treatment Interventions: Self-care/ADL training;DME and/or AE instruction;Therapeutic activities;Patient/family education;Balance training   OT Goals Acute Rehab OT Goals OT Goal Formulation: With patient Time For Goal Achievement: 09/01/12 Potential to Achieve Goals: Good ADL Goals Pt Will Perform Lower Body Dressing: with modified independence;Sit to stand from chair;with adaptive equipment ADL Goal: Lower Body Dressing - Progress: Goal set today Pt Will Perform Tub/Shower Transfer: with min assist;Ambulation;with DME ADL Goal: Tub/Shower Transfer - Progress: Goal set today  Visit Information  Last OT Received On: 08/18/12 Assistance Needed: +1    Subjective Data  Subjective: "feels good to sit up awhile"- pt response to positioning in chair Patient Stated Goal: to go to [place] [facility] for rehab   Prior Functioning     Home Living Available Help at Discharge: Skilled Nursing Facility (plans to d/c to [place]) Bathroom Shower/Tub: Tub/shower unit [employer]: Standard Prior Function Level of Independence: Independent Driving: Yes 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 Dominant Hand: Right         Vision/Perception     Cognition  Overall Cognitive Status: Appears within functional limits for tasks assessed/performed Arousal/Alertness: Awake/alert Orientation Level: Appears intact for tasks assessed Behavior During Session: [facility] for tasks performed    Extremity/Trunk Assessment Right Upper Extremity  Assessment RUE ROM/Strength/Tone: Within functional levels RUE Sensation: WFL - Light Touch RUE Coordination: WFL - gross/fine motor Left  Upper Extremity Assessment LUE ROM/Strength/Tone: Within functional levels LUE Sensation: WFL - Light Touch LUE Coordination: WFL - gross/fine motor Right Lower Extremity Assessment RLE Sensation: WFL - Light Touch Left Lower Extremity Assessment LLE Sensation: WFL - Light Touch Trunk Assessment Trunk Assessment: Normal     Mobility Bed Mobility Bed Mobility: Supine to Sit;Sit to Supine;Sitting - Scoot to [place] of Bed Transfers Transfers: Sit to Stand;Stand to Sit Sit to Stand: From bed;From chair/3-in-1;With upper extremity assist;4: Min assist Stand to Sit: 4: Min assist;With upper extremity assist;To chair/3-in-1 Details for Transfer Assistance: pt with uncontrolled descend and poor hand placement     Shoulder Instructions     Exercise     Balance Static Standing Balance Static Standing - Balance Support: No upper extremity supported Static Standing - Level of Assistance: 5: Stand by assistance Static Standing - Comment/# of Minutes: 5   End of Session OT - End of Session Activity Tolerance: Patient tolerated treatment well Patient left: in chair;with call bell/phone within reach Nurse Communication: Mobility status;Precautions  GO     Lucile Shutters 08/18/2012, 9:20 AM Pager: [phone]

## 2012-08-18 NOTE — Evaluation (Signed)
Physical Therapy Evaluation Patient Details Name: Amber [name] MRN: [id number] DOB: [birth year] Today's Date: 08/18/2012 Time: 4098-1191 PT Time Calculation (min): 29 min  PT Assessment / Plan / Recommendation Clinical Impression  Pt is a 51 y/o female s/p R THA.  Pt lives alone and has no family/community support.  Pt would benefit from continued PT to maximize functional mobility.  Short term SNF would be most appropriate setting for continued rehab.  Pt will need to be at least modified independent with all mobility for safe return to home.      PT Assessment  Patient needs continued PT services    Follow Up Recommendations  SNF;Supervision - Intermittent    Does the patient have the potential to tolerate intense rehabilitation      Barriers to Discharge Decreased caregiver support Pt live alone with no family/community support available.     Equipment Recommendations  Rolling walker with 5" wheels (3 in 1)    Recommendations for Other Services     Frequency 7X/week    Precautions / Restrictions Precautions Precautions: None Precaution Comments: Direct anterior approach, Restrictions Weight Bearing Restrictions: Yes RLE Weight Bearing: Weight bearing as tolerated   Pertinent Vitals/Pain Pt c/o 5/10 pain in hip.  Pt medicated by RN during session.       Mobility  Bed Mobility Bed Mobility: Supine to Sit;Sit to Supine;Sitting - Scoot to Edge of Bed Supine to Sit: 4: Min assist;HOB flat Sitting - Scoot to [place] of Bed: With rail;4: Min assist Sit to Supine: 3: Mod assist;With rail;HOB flat Details for Bed Mobility Assistance: Assist for R LE.   Transfers Transfers: Sit to Stand;Stand to Sit Sit to Stand: From bed;From chair/3-in-1;With upper extremity assist;4: Min assist Stand to Sit: 4: Min assist;With upper extremity assist;To chair/3-in-1 Details for Transfer Assistance: Repeated cueing for technique. pt with uncontrolled descend and poor hand  placement Ambulation/Gait Ambulation/Gait Assistance: 4: Min guard Ambulation Distance (Feet): 50 Feet Assistive device: Rolling walker Ambulation/Gait Assistance Details: cues for sequencing Gait Pattern: Step-to pattern Gait velocity: slow Wheelchair Mobility Wheelchair Mobility: No    Shoulder Instructions     Exercises Total Joint Exercises Ankle Circles/Pumps: 10 reps;Both Gluteal Sets: 10 reps;Both Heel Slides: Right;10 reps   PT Diagnosis: Difficulty walking;Generalized weakness;Abnormality of gait;Acute pain  PT Problem List: Decreased strength;Decreased activity tolerance;Decreased range of motion;Decreased mobility;Decreased knowledge of use of DME;Pain PT Treatment Interventions: Gait training;Stair training;DME instruction;Functional mobility training;Therapeutic activities;Therapeutic exercise;Patient/family education   PT Goals Acute Rehab PT Goals PT Goal Formulation: With patient Time For Goal Achievement: 09/01/12 Potential to Achieve Goals: Good Pt will go Supine/Side to Sit: with modified independence PT Goal: Supine/Side to Sit - Progress: Goal set today Pt will go Sit to Supine/Side: with modified independence PT Goal: Sit to Supine/Side - Progress: Goal set today Pt will go Sit to Stand: with modified independence PT Goal: Sit to Stand - Progress: Goal set today Pt will go Stand to Sit: with modified independence PT Goal: Stand to Sit - Progress: Goal set today Pt will Ambulate: >150 feet;with modified independence;with least restrictive assistive device PT Goal: Ambulate - Progress: Goal set today Pt will Perform Home Exercise Program: Independently PT Goal: Perform Home Exercise Program - Progress: Goal set today  Visit Information  Last PT Received On: 08/18/12 Assistance Needed: +1    Subjective Data      Prior Functioning  Home Living Available Help at Discharge: Skilled Nursing Facility (plans to d/c to [place]) Bathroom Shower/Tub:  Tub/shower  unit Bathroom Toilet: Standard Prior Function Level of Independence: Independent Driving: Yes 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 Dominant Hand: Right    Cognition  Overall Cognitive Status: Appears within functional limits for tasks assessed/performed Arousal/Alertness: Awake/alert Orientation Level: Appears intact for tasks assessed Behavior During Session: [facility] for tasks performed    Extremity/Trunk Assessment Right Upper Extremity Assessment RUE ROM/Strength/Tone: Within functional levels RUE Sensation: WFL - Light Touch RUE Coordination: WFL - gross/fine motor Left Upper Extremity Assessment LUE ROM/Strength/Tone: Within functional levels LUE Sensation: WFL - Light Touch LUE Coordination: WFL - gross/fine motor Right Lower Extremity Assessment RLE Sensation: WFL - Light Touch Left Lower Extremity Assessment LLE Sensation: WFL - Light Touch Trunk Assessment Trunk Assessment: Normal   Balance Balance Balance Assessed: No Static Standing Balance Static Standing - Balance Support: No upper extremity supported Static Standing - Level of Assistance: 5: Stand by assistance Static Standing - Comment/# of Minutes: 5  End of Session PT - End of Session Equipment Utilized During Treatment: Gait belt Activity Tolerance: Patient tolerated treatment well Patient left: in bed;with call bell/phone within reach Nurse Communication: Mobility status  GP     Amber [name] 08/18/2012, 11:21 AM Amber [name] DPT [phone]

## 2012-08-18 NOTE — Progress Notes (Signed)
Physical Therapy Treatment Patient Details Name: Amber [name] MRN: [id number] DOB: 1961/02/23 Today's Date: 08/18/2012 Time: 7829-5621 PT Time Calculation (min): 26 min  PT Assessment / Plan / Recommendation Comments on Treatment Session  Pt progressing well    Follow Up Recommendations  SNF;Supervision - Intermittent     Does the patient have the potential to tolerate intense rehabilitation     Barriers to Discharge        Equipment Recommendations  Rolling walker with 5" wheels    Recommendations for Other Services    Frequency 7X/week   Plan Discharge plan remains appropriate;Frequency remains appropriate    Precautions / Restrictions Precautions Precautions: None Precaution Comments: Direct anterior approach, Restrictions Weight Bearing Restrictions: Yes RLE Weight Bearing: Weight bearing as tolerated   Pertinent Vitals/Pain 5/10 pain in R hip. RN notified.     Mobility  Bed Mobility Bed Mobility: Not assessed Transfers Transfers: Not assessed Ambulation/Gait Ambulation/Gait Assistance: Not tested (comment)    Exercises Total Joint Exercises Ankle Circles/Pumps: 10 reps;Both Quad Sets: 10 reps;Right Gluteal Sets: 10 reps;Both Short Arc Quad: 20 reps;Right Heel Slides: 20 reps;Right Hip ABduction/ADduction: 20 reps;Right Straight Leg Raises: 10 reps;Right Marching in Standing: 10 reps;Right   PT Diagnosis:    PT Problem List:   PT Treatment Interventions:     PT Goals Acute Rehab PT Goals PT Goal Formulation: With patient Time For Goal Achievement: 09/01/12 Potential to Achieve Goals: Good Pt will go Supine/Side to Sit: with modified independence Pt will go Sit to Supine/Side: with modified independence Pt will go Sit to Stand: with modified independence Pt will go Stand to Sit: with modified independence Pt will Ambulate: >150 feet;with modified independence;with least restrictive assistive device Pt will Perform Home Exercise Program:  Independently PT Goal: Perform Home Exercise Program - Progress: Progressing toward goal  Visit Information  Last PT Received On: 08/18/12 Assistance Needed: +1    Subjective Data  Subjective: agree to therapy Patient Stated Goal: Walk without pain.    Cognition  Overall Cognitive Status: Appears within functional limits for tasks assessed/performed Arousal/Alertness: Awake/alert Orientation Level: Appears intact for tasks assessed Behavior During Session: [facility] for tasks performed    Balance     End of Session PT - End of Session Equipment Utilized During Treatment: Gait belt Activity Tolerance: Patient tolerated treatment well   GP     Jaquay Posthumus 08/18/2012, 3:36 PM Ramona Slinger L. Nakul Avino DPT [phone]

## 2012-08-18 NOTE — Progress Notes (Signed)
Subjective: 1 Day Post-Op Procedure(s) (LRB): TOTAL HIP ARTHROPLASTY ANTERIOR APPROACH (Right)  Activity level:  May be wbat Diet tolerance:  ok Voiding:  ok Patient reports pain as 5 on 0-10 scale.    Objective: Vital signs in last 24 hours: Temp:  [97.1 F (36.2 C)-99.2 F (37.3 C)] 99.2 F (37.3 C) (12/18 0600) Pulse Rate:  [48-72] 63  (12/18 0600) Resp:  [9-26] 18  (12/18 0600) BP: (98-161)/(42-84) 117/43 mmHg (12/18 0600) SpO2:  [96 %-100 %] 100 % (12/18 0600)  Labs:  [facility] 08/18/12 0545  HGB 10.6*    Basename 08/18/12 0545  WBC 14.4*  RBC 3.63*  HCT 30.8*  PLT 220    Basename 08/18/12 0545  NA 141  K 4.1  CL 103  CO2 27  BUN 6  CREATININE 0.65  GLUCOSE 129*  CALCIUM 9.6   No results found for this basename: LABPT:2,INR:2 in the last 72 hours  Physical Exam:  Neurologically intact ABD soft Neurovascular intact Sensation intact distally Intact pulses distally Dorsiflexion/Plantar flexion intact Incision: dressing C/D/I No cellulitis present Compartment soft  Assessment/Plan:  1 Day Post-Op Procedure(s) (LRB): TOTAL HIP ARTHROPLASTY ANTERIOR APPROACH (Right) Advance diet Up with therapy D/C IV fluids Discharge to SNFprobably thur. She has no one at home and needs SNF ASA 325 BID x 4 weeks for DVT prevention Will d/c iv Cont pt WBAT Has bed at [facility] [place] 08/18/2012, 8:08 AM

## 2012-08-18 NOTE — Clinical Social Work Psychosocial (Signed)
Clinical Social Work Department  BRIEF PSYCHOSOCIAL ASSESSMENT  Patient: Amber [name]  Account Number: [id number]  Admit date: 08/17/12 Clinical Social Worker Date/Time:  Referred by: Physician Date Referred: 08/17/12 Referred for   SNF Placement   Other Referral:  Interview type: Patient  Other interview type:  PSYCHOSOCIAL DATA  Living Status:Alone Admitted from facility:  Level of care:  Primary support name: Suzan Garibaldi Primary support relationship to patient: Mother Degree of support available:  Poor- non-existant  CURRENT CONCERNS  Current Concerns   Post-Acute Placement   Other Concerns:  SOCIAL WORK ASSESSMENT / PLAN  CSW met with pt re: PT recommendation for SNF.   Pt lives alone and has no support at home.   CSW explained placement process and answered questions.   Pt reports [name] is her preference    CSW completed FL2 and initiated [facility] search.   CSW to f/u with offers.   Assessment/plan status: Information/Referral to [facility]  Other assessment/ plan:  Information/referral to community resources:  SNF   PTAR   PATIENT'S/FAMILY'S RESPONSE TO PLAN OF CARE:  Pt reports she is agreeable to [facility] in order to increase strength and independence with mobility prior to return home with her grandson. Pt verbalized understanding of placement process and appreciation for CSW assist.   Sabino Niemann, MSW [phone]

## 2012-08-18 NOTE — Progress Notes (Signed)
UR COMPLETED  

## 2012-08-18 NOTE — Progress Notes (Addendum)
Clinical Social Work Department  CLINICAL SOCIAL WORK PLACEMENT NOTE  08/18/2012  Patient: Amber [name]  Account Number:  [id number] Admit date: 08/17/12 Clinical Social Worker: Sabino Niemann, MSW  Date/time: 08/18/2012 11:30 AM  Clinical Social Work is seeking post-discharge placement for this patient at the following level of care: SKILLED NURSING (*CSW will update this form in Epic as items are completed)  08/18/2012 Patient/family provided with [facility] Department of Clinical Social Work's list of facilities offering this level of care within the geographic area requested by the patient (or if unable, by the patient's family).  08/18/2012 Patient/family informed of their freedom to choose among providers that offer the needed level of care, that participate in Medicare, Medicaid or managed care program needed by the patient, have an available bed and are willing to accept the patient.  12/18/2013Patient/family informed of [facility]' ownership interest in [facility], as well as of the fact that they are under no obligation to receive care at this facility.  PASARR submitted to EDS on 08/18/2012 PASARR number received from EDS on12/18/2013 FL2 transmitted to all facilities in geographic area requested by pt/family on 08/18/2012  FL2 transmitted to all facilities within larger geographic area on  Patient informed that his/her managed care company has contracts with or will negotiate with certain facilities, including the following:  Patient/family informed of bed offers received: 08/18/12  Patient chooses bed at [facility] Physician recommends and patient chooses bed at [facility] Patient to be transferred to on  Patient to be transferred to facility by [facility] The following physician request were entered in Epic:  Additional Comments:  Sabino Niemann, MSW [phone]

## 2012-08-19 LAB — CBC
Hemoglobin: 9.8 g/dL — ABNORMAL LOW (ref 12.0–15.0)
MCH: 29.8 pg (ref 26.0–34.0)
MCV: 84.8 fL (ref 78.0–100.0)
RBC: 3.29 MIL/uL — ABNORMAL LOW (ref 3.87–5.11)

## 2012-08-19 MED ORDER — EZETIMIBE 10 MG PO TABS: 10.0000 mg | ORAL_TABLET | Freq: Every day | ORAL | Status: DC

## 2012-08-19 MED ORDER — HYDROCODONE-ACETAMINOPHEN 5-325 MG PO TABS: 1.0000 | ORAL_TABLET | ORAL | Status: DC | PRN

## 2012-08-19 MED ORDER — ASPIRIN 325 MG PO TBEC: 325.0000 mg | DELAYED_RELEASE_TABLET | Freq: Two times a day (BID) | ORAL | Status: DC

## 2012-08-19 MED ORDER — DSS 100 MG PO CAPS: 100.0000 mg | ORAL_CAPSULE | Freq: Two times a day (BID) | ORAL | Status: DC

## 2012-08-19 NOTE — Progress Notes (Signed)
Subjective: 2 Days Post-Op Procedure(s) (LRB): TOTAL HIP ARTHROPLASTY ANTERIOR APPROACH (Right)  Activity level:  Continuing to work with therapy on balance and safety issues Diet tolerance:  ok Voiding:  ok Patient reports pain as 3 on 0-10 scale.    Objective: Vital signs in last 24 hours: Temp:  [98.3 F (36.8 C)-99.6 F (37.6 C)] 98.3 F (36.8 C) (12/19 0532) Pulse Rate:  [64-76] 74  (12/19 0532) Resp:  [18] 18  (12/19 0532) BP: (86-113)/(36-46) 109/45 mmHg (12/19 0532) SpO2:  [100 %] 100 % (12/19 0532)  Labs:  Basename 08/19/12 0600 08/18/12 0545  HGB 9.8* 10.6*    Basename 08/19/12 0600 08/18/12 0545  WBC 11.2* 14.4*  RBC 3.29* 3.63*  HCT 27.9* 30.8*  PLT 202 220    Basename 08/18/12 0545  NA 141  K 4.1  CL 103  CO2 27  BUN 6  CREATININE 0.65  GLUCOSE 129*  CALCIUM 9.6   No results found for this basename: LABPT:2,INR:2 in the last 72 hours  Physical Exam:  Neurologically intact ABD soft Neurovascular intact Sensation intact distally Intact pulses distally Dorsiflexion/Plantar flexion intact Incision: dressing C/D/I No cellulitis present Compartment soft  Assessment/Plan:  2 Days Post-Op Procedure(s) (LRB): TOTAL HIP ARTHROPLASTY ANTERIOR APPROACH (Right) Advance diet Up with therapy Discharge to SNF today We'll continue to work with physical therapy on balance and safety issues. Continue on ASA 325 one twice a day for 4 weeks postop May be weightbearing as tolerated.    Terrilee Dudzik R 08/19/2012, 11:06 AM

## 2012-08-19 NOTE — Progress Notes (Signed)
Physical Therapy Treatment Patient Details Name: Amber [name] MRN: [id number] DOB: 04-Nov-1960 Today's Date: 08/19/2012 Time: 4098-1191 PT Time Calculation (min): 35 min  PT Assessment / Plan / Recommendation Comments on Treatment Session  Pt continues to require assistance- with mobility.  Pt presents with generalized weakness, pain and balance deficits that place her as a significant falls risk.  Continued rehab in short term SNF still the most appropriate discharge plan.       Follow Up Recommendations  SNF;Supervision - Intermittent     Does the patient have the potential to tolerate intense rehabilitation   yes  Barriers to Discharge  Pt lives alone with no family or community support.       Equipment Recommendations  Rolling walker with 5" wheels    Recommendations for Other Services    Frequency 7X/week   Plan Discharge plan remains appropriate;Frequency remains appropriate    Precautions / Restrictions Precautions Precautions: None Precaution Comments: Direct anterior approach, Restrictions Weight Bearing Restrictions: Yes RLE Weight Bearing: Weight bearing as tolerated   Pertinent Vitals/Pain Pt reporting 3-4/10 pain at rest.  Increased to 6-7/10 during gait training. Pt medicated prior to PT session. Repositioned R LE for pain relief and applied ice pack at end of session.     Mobility  Bed Mobility Bed Mobility: Supine to Sit;Sit to Supine Supine to Sit: 4: Min assist;HOB flat Sit to Supine: 4: Min assist Details for Bed Mobility Assistance: Assist for R LE.  Cueing for technique to minimize pain.  Pt unable to control R LE without assistance.  Transfers Transfers: Stand to Sit;Sit to Stand Sit to Stand: 4: Min assist;With upper extremity assist;From chair/3-in-1 Stand to Sit: 4: Min guard;To chair/3-in-1;With upper extremity assist Details for Transfer Assistance: Pt continues to require cueing for hand and R LE placment. Assist to steady pt when  transitoning from sit to stand secondary LE weakness,  Ambulation/Gait Ambulation/Gait Assistance: 4: Min guard Ambulation Distance (Feet): 80 Feet Assistive device: Rolling walker Ambulation/Gait Assistance Details: Cueing for proper distancing from RW.  Gait is labored and slow. Cueing to increase gait velocity.  C/o intermittent increase in pain and instability on R LE in R stance phase.    Gait Pattern: Step-to pattern;Decreased stride length;Decreased hip/knee flexion - right;Decreased weight shift to right Gait velocity: 67ft/95sec = 0.26 ft/sec,  High falls risk.   Stairs: Yes Stairs Assistance: 4: Min assist Stairs Assistance Details (indicate cue type and reason): Cueing for sequencing assist to steady pt when advancing L LE secondary to pain and weakness in R LE with increased WB.   Stair Management Technique: Two rails;Forwards Number of Stairs: 2     Exercises     PT Diagnosis:    PT Problem List:   PT Treatment Interventions:     PT Goals Acute Rehab PT Goals PT Goal Formulation: With patient Time For Goal Achievement: 09/01/12 Potential to Achieve Goals: Good Pt will go Supine/Side to Sit: with modified independence PT Goal: Supine/Side to Sit - Progress: Progressing toward goal Pt will go Sit to Supine/Side: with modified independence PT Goal: Sit to Supine/Side - Progress: Progressing toward goal Pt will go Sit to Stand: with modified independence PT Goal: Sit to Stand - Progress: Progressing toward goal Pt will go Stand to Sit: with modified independence PT Goal: Stand to Sit - Progress: Progressing toward goal Pt will Ambulate: >150 feet;with modified independence;with least restrictive assistive device PT Goal: Ambulate - Progress: Progressing toward goal Pt will Perform Home  Exercise Program: Independently Additional Goals Additional Goal #1: Pt will perform TUG or other standardized balance test within safe range.  (TUG <13 seconds) PT Goal: Additional Goal  #1 - Progress: Goal set today  Visit Information  Last PT Received On: 08/19/12 Assistance Needed: +1    Subjective Data  Subjective: agree to therapy Patient Stated Goal: Walk without pain.    Cognition  Overall Cognitive Status: Appears within functional limits for tasks assessed/performed Arousal/Alertness: Awake/alert Orientation Level: Appears intact for tasks assessed Behavior During Session: [facility] for tasks performed    Balance  Balance Balance Assessed: Yes Static Standing Balance Static Standing - Balance Support: No upper extremity supported Static Standing - Level of Assistance: 4: Min assist Static Standing - Comment/# of Minutes: pt stood for 1 minute with increased anterior- posterior sway.  Required assist to prevent falling back x 2 episodes.  Dynamic Standing Balance Dynamic Standing - Balance Activities: Forward lean/weight shifting;Lateral lean/weight shifting Standardized Balance Assessment Standardized Balance Assessment: Timed Up and Go Test Timed Up and Go Test TUG: Normal TUG Normal TUG (seconds): 26   End of Session PT - End of Session Equipment Utilized During Treatment: Gait belt Activity Tolerance: Patient tolerated treatment well Patient left: in chair;with call bell/phone within reach Nurse Communication: Mobility status   GP     Jaquae Rieves 08/19/2012, 11:38 AM Theron Arista L. Zamarah Ullmer DPT [phone]

## 2012-08-19 NOTE — Discharge Summary (Signed)
Patient ID: Amber [name] MRN: [id number] DOB/AGE: 53/14/1962 53 y.o.  Admit date: 08/17/2012 Discharge date: 08/19/2012  Admission Diagnoses:  Principal Problem:  *DJD (degenerative joint disease) of hip   Discharge Diagnoses:  Same  Past Medical History  Diagnosis Date  . Hyperlipidemia   . Osteoporosis   . Breast cancer   . Hypertension     Dr. [facility]  . GERD (gastroesophageal reflux disease)     hx of   . Neuromuscular disorder     carpal tunnel right hand  . Chronic neck pain     hx of  . Hypothyroidism   . Heart attack 2001    Dr. Isabel Caprice  . Anginal pain 2001  . Pernicious anemia   . Sickle cell trait   . Migraines     "last one was 1990's" (08/17/2012)  . DJD (degenerative joint disease) of hip     bil hip    Surgeries: Procedure(s): TOTAL HIP ARTHROPLASTY ANTERIOR APPROACH on 08/17/2012   Consultants:    Discharged Condition: Improved  Hospital Course: QUANTINA DERSHEM is an 51 y.o. female who was admitted 08/17/2012 for operative treatment ofDJD (degenerative joint disease) of hip. Patient has severe unremitting pain that affects sleep, daily activities, and work/hobbies. After pre-op clearance the patient was taken to the operating room on 08/17/2012 and underwent  Procedure(s): TOTAL HIP ARTHROPLASTY ANTERIOR APPROACH.    Patient was given perioperative antibiotics: Anti-infectives     Start     Dose/Rate Route Frequency Ordered Stop   08/17/12 1430   ceFAZolin (ANCEF) IVPB 2 g/50 mL premix        2 g 100 mL/hr over 30 Minutes Intravenous Every 6 hours 08/17/12 1328 08/17/12 2100   08/17/12 0603   ceFAZolin (ANCEF) 2-3 GM-% IVPB SOLR     Comments: MOORE, KRISTI: cabinet override         08/17/12 0603 08/17/12 0743   08/16/12 1402   ceFAZolin (ANCEF) IVPB 2 g/50 mL premix  Status:  Discontinued        2 g 100 mL/hr over 30 Minutes Intravenous 60 min pre-op 08/16/12 1402 08/17/12 1322           Patient  was given sequential compression devices, early ambulation, and chemoprophylaxis to prevent DVT.  Patient benefited maximally from hospital stay and there were no complications.    Recent vital signs: Patient Vitals for the past 24 hrs:  BP Temp Temp src Pulse Resp SpO2  08/19/12 0532 109/45 mmHg 98.3 F (36.8 C) Oral 74  18  100 %  08/19/12 0400 - - - - 18  100 %  08/19/12 0000 - - - - 18  100 %  2012/08/30 2046 86/36 mmHg 99.2 F (37.3 C) Oral 76  18  100 %  August 30, 2012 2000 - - - - 18  100 %  08-30-2012 1400 113/46 mmHg 99.6 F (37.6 C) - 64  18  100 %     Recent laboratory studies:  Basename 08/19/12 0600 08-30-12 0545  WBC 11.2* 14.4*  HGB 9.8* 10.6*  HCT 27.9* 30.8*  PLT 202 220  NA -- 141  K -- 4.1  CL -- 103  CO2 -- 27  BUN -- 6  CREATININE -- 0.65  GLUCOSE -- 129*  INR -- --  CALCIUM -- 9.6     Discharge Medications:     Medication List     As of 08/19/2012 11:16 AM    STOP taking  these medications         meloxicam 15 MG tablet   Commonly known as: MOBIC      TAKE these medications         alendronate 70 MG tablet   Commonly known as: FOSAMAX   Take 70 mg by mouth every 7 (seven) days. Take with a full glass of water on an empty stomach. Saturday      aspirin 325 MG EC tablet   Take 1 tablet (325 mg total) by mouth 2 (two) times daily.      atenolol 25 MG tablet   Commonly known as: TENORMIN   Take 25 mg by mouth daily.      atorvastatin 80 MG tablet   Commonly known as: LIPITOR   Take 80 mg by mouth at bedtime.      BIOTIN PO   Take 2 tablets by mouth daily.      COQ10 PO   Take 1 tablet by mouth daily.      cyanocobalamin 1000 MCG/ML injection   Commonly known as: (VITAMIN B-12)   Inject 1,000 mcg into the muscle every 30 (thirty) days. As needed when feeling tired      cyclobenzaprine 10 MG tablet   Commonly known as: FLEXERIL   Take 10 mg by mouth 3 (three) times daily as needed. For muscle pain      DSS 100 MG Caps   Take 100 mg  by mouth 2 (two) times daily.      ezetimibe 10 MG tablet   Commonly known as: ZETIA   Take 1 tablet (10 mg total) by mouth daily.      FISH OIL PO   Take 1 capsule by mouth daily.      GLUCOSAMINE-CHONDROITIN PO   Take 1 tablet by mouth daily.      HYDROcodone-acetaminophen 5-325 MG per tablet   Commonly known as: NORCO/VICODIN   Take 1-2 tablets by mouth every 4 (four) hours as needed (breakthrough pain).      IRON PO   Take 1 tablet by mouth at bedtime as needed. When feeling tired      levothyroxine 112 MCG tablet   Commonly known as: SYNTHROID, LEVOTHROID   Take 112 mcg by mouth every morning.      lisinopril 5 MG tablet   Commonly known as: PRINIVIL,ZESTRIL   Take 5 mg by mouth daily.      mometasone 50 MCG/ACT nasal spray   Commonly known as: NASONEX   Place 2 sprays into the nose daily as needed. For allergies      VITAMIN D PO   Take 1 tablet by mouth daily.        Diagnostic Studies: Dg Hip Operative Right  22-Aug-2012  *RADIOLOGY REPORT*  Clinical Data: Degenerative joint disease.  Total right hip replacement.  DG OPERATIVE RIGHT HIP  Comparison: 11/09/2008  Findings: Two intraoperative spot images demonstrate changes of right total hip replacement.  Normal AP alignment.  Small bone fragment noted along the lateral lip of the acetabulum.  IMPRESSION: Changes of right total hip replacement.  Small bone fragment along the lateral acetabular lip.   Original Report Authenticated By: Charlett Nose, M.D.     Disposition: Patient being discharged to a skilled nursing facility for continued therapy  for balance and safety issues.      Discharge Orders    Future Appointments: Provider: Department: Dept Phone: Center:   09/08/2012 2:00 PM [facility]  ONCOLOGY [phone] None   09/08/2012 2:30 PM Lurline Hare, MD [place] [facility]COL[facility]Y [phone] None   09/08/2012 3:00 PM Lurline Hare, MD [place]  [facility]COL[facility] [phone] None     Joint Appt [facility] [place] [facility] ONCOL[facility] [phone] None   09/13/2012 3:00 PM [facility] [place] [facility] ONCOL[facility]Y [phone] None   10/04/2012 9:30 AM [facility] MEDICAL ONCOLOGY [phone] None   10/04/2012 10:00 AM Victorino December, MD [facility] MEDICAL ONCOLOGY [phone] None     Future Orders Please Complete By Expires   Diet - low sodium heart healthy      Call MD / Call 911      Comments:   If you experience chest pain or shortness of breath, CALL 911 and be transported to the hospital emergency room.  If you develope a fever above 101 F, pus (white drainage) or increased drainage or redness at the wound, or calf pain, call your surgeon's office.   Constipation Prevention      Comments:   Drink plenty of fluids.  Prune juice may be helpful.  You may use a stool softener, such as Colace (over the counter) 100 mg twice a day.  Use MiraLax (over the counter) for constipation as needed.   Increase activity slowly as tolerated        she may be weightbearing as tolerated. There are no specific anterior hip precautions. She will remain on ASA 325 1 pill twice a day for 4 weeks from surgery to prevent DVT.  Follow-up Information    Follow up with DALLDORF,PETER G, MD. Call in 2 weeks.   Contact information:   [address] [place] [address] [phone]           Signed: Prince Rome 08/19/2012, 11:16 AM

## 2012-09-06 ENCOUNTER — Encounter: Payer: Self-pay | Admitting: Radiation Oncology

## 2012-09-06 NOTE — Progress Notes (Signed)
52 year old female.  Right breast invasive ductal carcinoma with negative margins. ER, PR positive and HER 2 negative. Last seen by Dr. Michell Heinrich on 07/21/2012. Patient was scheduled to have hip replacement done 08/17/2012. Delayed radiation treatments until the middle of January. Last seen by Dr. Derrell Lolling on 08/02/12 with a follow up scheduled for May 2014. Scheduled to see Welton Flakes 10/04/2012.  AX: Betadine and Tramadol No hx of radiation therapy No indication of a pacemaker

## 2012-09-08 ENCOUNTER — Ambulatory Visit
Admission: RE | Admit: 2012-09-08 | Discharge: 2012-09-08 | Disposition: A | Discharge status: Home / Self Care | Payer: BC Managed Care – PPO | Source: Ambulatory Visit | Attending: Radiation Oncology | Admitting: Radiation Oncology

## 2012-09-08 ENCOUNTER — Telehealth: Payer: Self-pay | Admitting: Radiation Oncology

## 2012-09-08 ENCOUNTER — Encounter: Payer: Self-pay | Admitting: Radiation Oncology

## 2012-09-08 VITALS — BP 112/51 | HR 71 | Temp 98.4°F | Resp 16 | Ht 65.0 in | Wt 142.0 lb

## 2012-09-08 DIAGNOSIS — C50319 Malignant neoplasm of lower-inner quadrant of unspecified female breast: Secondary | ICD-10-CM

## 2012-09-08 DIAGNOSIS — Z510 Encounter for antineoplastic radiation therapy: Secondary | ICD-10-CM | POA: Insufficient documentation

## 2012-09-08 DIAGNOSIS — C50919 Malignant neoplasm of unspecified site of unspecified female breast: Secondary | ICD-10-CM | POA: Insufficient documentation

## 2012-09-08 DIAGNOSIS — L989 Disorder of the skin and subcutaneous tissue, unspecified: Secondary | ICD-10-CM | POA: Insufficient documentation

## 2012-09-08 NOTE — Progress Notes (Signed)
See progress note under physician encounter. 

## 2012-09-08 NOTE — Telephone Encounter (Signed)
Met w patient to discuss [facility] billing and pt inquired about financial assistance. Pt had already talked w med onc FA (Raquel) and is over qualified for EPP at this time.   Pt was instructed to call [facility] and  [facility] billing dept for pymt arrangements on future bills.

## 2012-09-08 NOTE — Progress Notes (Signed)
Patient presents to the clinic today unaccompanied for follow up new consult with Dr. Michell Heinrich to discuss the role of radiation therapy in the treatment of right breast ca. Patient alert and oriented to person, place, and time. No distress noted. Slow steady gait noted with assistance of cane. Pleasant affect noted. Patient denies pain at this time. Patient reports that she has not had any pain medication following her hip surgery in ten days now. Patient reports that her hip surgery was done in November and she was discharged from rehab the day after christmas. Patient denies right breast pain. Patient denies palpating any lumps or bumps in her right breast. Patient denies any warm of the right breast. No edema of the right arm noted. Full ROM of both upper extremities demonstrated. Patient denies nausea, vomiting, headache, dizziness or unexplained weight loss.  Reported all findings to Dr. Michell Heinrich.

## 2012-09-08 NOTE — Progress Notes (Signed)
Facilitated conference with Merla Riches so that patient's financial concerns could be addressed.

## 2012-09-09 NOTE — Progress Notes (Signed)
Name: Amber [name]   MRN: [id number]  Date:  09/09/2012  DOB: 1960-09-12  Status:outpatient    DIAGNOSIS: Breast cancer.  CONSENT VERIFIED: yes   SET UP: Patient is setup supine   IMMOBILIZATION:  The following immobilization was used:Custom Moldable Pillow, breast board.   NARRATIVE: Ms. Durr was brought to the CT Simulation planning suite.  Identity was confirmed.  All relevant records and images related to the planned course of therapy were reviewed.  Then, the patient was positioned in a stable reproducible clinical set-up for radiation therapy.  Wires were placed to delineate the clinical extent of breast tissue. A wire was placed on the scar as well.  CT images were obtained.  An isocenter was placed. Skin markings were placed.  The CT images were loaded into the planning software where the target and avoidance structures were contoured.  The radiation prescription was entered and confirmed. The patient was discharged in stable condition and tolerated simulation well.    TREATMENT PLANNING NOTE:  Treatment planning then occurred. I have requested : MLC's, isodose plan, basic dose calculation

## 2012-09-09 NOTE — Progress Notes (Signed)
Department of Radiation Oncology  Phone:  [phone] Fax:        [phone]   Name: TEASIA ZAPF   DOB: July 24, 1961  MRN: [id number]    Date: 09/09/2011  Follow Up Visit Note  Diagnosis: T1bN0 Invasive Ductal Carcinoma of the right breast  Interval History: Shekira presents today for routine followup.  She did well with her hip replacement surgery. She has been released from her rehabilitation facility. She quit smoking for about 4 days but unfortunately has started back. She is ready to proceed on with radiation. She had many questions about cost of treatment so we'll get her set up with our financial counselor.  Allergies:  Allergies  Allergen Reactions  . Betadine (Povidone Iodine) Rash  . Tramadol Nausea And Vomiting and Other (See Comments)    "couldn't stop vomiting" (08/17/2012)    Medications:  Current Outpatient Prescriptions  Medication Sig Dispense Refill  . alendronate (FOSAMAX) 70 MG tablet Take 70 mg by mouth every 7 (seven) days. Take with a full glass of water on an empty stomach. Saturday      . aspirin EC 325 MG EC tablet Take 1 tablet (325 mg total) by mouth 2 (two) times daily.  60 tablet  1  . atenolol (TENORMIN) 25 MG tablet Take 25 mg by mouth daily.      Marland Kitchen atorvastatin (LIPITOR) 80 MG tablet Take 80 mg by mouth at bedtime.      Marland Kitchen BIOTIN PO Take 2 tablets by mouth daily.      . Cholecalciferol (VITAMIN D PO) Take 1 tablet by mouth daily.      . Coenzyme Q10 (COQ10 PO) Take 1 tablet by mouth daily.      . cyanocobalamin (,VITAMIN B-12,) 1000 MCG/ML injection Inject 1,000 mcg into the muscle every 30 (thirty) days. As needed when feeling tired      . cyclobenzaprine (FLEXERIL) 10 MG tablet Take 10 mg by mouth 3 (three) times daily as needed. For muscle pain      . docusate sodium 100 MG CAPS Take 100 mg by mouth 2 (two) times daily.  30 capsule  1  . ezetimibe (ZETIA) 10 MG tablet Take 1 tablet (10 mg total) by mouth daily.  30 tablet  1  .  GLUCOSAMINE-CHONDROITIN PO Take 1 tablet by mouth daily.      Marland Kitchen HYDROcodone-acetaminophen (NORCO/VICODIN) 5-325 MG per tablet Take 1-2 tablets by mouth every 4 (four) hours as needed (breakthrough pain).  80 tablet  0  . hydrOXYzine (ATARAX/VISTARIL) 25 MG tablet       . IRON PO Take 1 tablet by mouth at bedtime as needed. When feeling tired      . levothyroxine (SYNTHROID, LEVOTHROID) 112 MCG tablet Take 125 mcg by mouth every morning.       Marland Kitchen lisinopril (PRINIVIL,ZESTRIL) 5 MG tablet Take 5 mg by mouth daily.      . mometasone (NASONEX) 50 MCG/ACT nasal spray Place 2 sprays into the nose daily as needed. For allergies      . Omega-3 Fatty Acids (FISH OIL PO) Take 1 capsule by mouth daily.      Marland Kitchen triamcinolone (KENALOG) 0.1 % paste         Physical Exam:   height is 5\' 5"  (1.651 m) and weight is 142 lb (64.411 kg). Her oral temperature is 98.4 F (36.9 C). Her blood pressure is 112/51 and her pulse is 71. Her respiration is 16 and oxygen saturation is 100%.  She is a pleasant female in no distress sitting comfortably examining table  IMPRESSION: Maui is a 52 y.o. female T1N0 right breast cancer  PLAN:  We will proceed on with radiation simulation today. We've gone over the risks and benefits of treatment again. She has signed informed consent. She will meet with our financial counselor. We discussed hypofractionated treatment providing she has correct measurements.    Lurline Hare, MD

## 2012-09-15 ENCOUNTER — Encounter: Payer: Self-pay | Admitting: Radiation Oncology

## 2012-09-15 ENCOUNTER — Ambulatory Visit
Admission: RE | Admit: 2012-09-15 | Discharge: 2012-09-15 | Disposition: A | Discharge status: Home / Self Care | Payer: BC Managed Care – PPO | Source: Ambulatory Visit | Attending: Radiation Oncology | Admitting: Radiation Oncology

## 2012-09-16 ENCOUNTER — Ambulatory Visit
Admission: RE | Admit: 2012-09-16 | Discharge: 2012-09-16 | Disposition: A | Discharge status: Home / Self Care | Payer: BC Managed Care – PPO | Source: Ambulatory Visit | Attending: Radiation Oncology | Admitting: Radiation Oncology

## 2012-09-17 ENCOUNTER — Ambulatory Visit
Admission: RE | Admit: 2012-09-17 | Discharge: 2012-09-17 | Disposition: A | Discharge status: Home / Self Care | Payer: BC Managed Care – PPO | Source: Ambulatory Visit | Attending: Radiation Oncology | Admitting: Radiation Oncology

## 2012-09-17 ENCOUNTER — Other Ambulatory Visit: Payer: Self-pay | Admitting: Radiation Oncology

## 2012-09-17 VITALS — BP 113/41 | HR 64 | Temp 97.9°F

## 2012-09-17 DIAGNOSIS — C50319 Malignant neoplasm of lower-inner quadrant of unspecified female breast: Secondary | ICD-10-CM

## 2012-09-17 DIAGNOSIS — R110 Nausea: Secondary | ICD-10-CM

## 2012-09-17 DIAGNOSIS — C50919 Malignant neoplasm of unspecified site of unspecified female breast: Secondary | ICD-10-CM

## 2012-09-17 MED ORDER — ALRA NON-METALLIC DEODORANT (RAD-ONC)
1.0000 "application " | Freq: Once | TOPICAL | Status: AC
  Administered 2012-09-17: 1 via TOPICAL

## 2012-09-17 MED ORDER — RADIAPLEXRX EX GEL
Freq: Once | CUTANEOUS | Status: AC
  Administered 2012-09-17: 1 via TOPICAL

## 2012-09-17 MED ORDER — PROMETHAZINE HCL 25 MG PO TABS: 25.0000 mg | ORAL_TABLET | Freq: Four times a day (QID) | ORAL | Status: DC | PRN

## 2012-09-17 NOTE — Progress Notes (Signed)
Amber [name] in for education today.  Reviewed skin care, fatigue and pain management.  Given Radiation Therapy and You booklet with appropriate pages marked. Informed her that she will see Dr. Michell Heinrich every Tuesday following treatment, but if this changes she will be notified in advance.  Given this RN's Card.    She reports today that she has been having nausea without vomiting since she completed treatment on yesterday.  She denies any diarrhea  Explained that this is not typical of radiaiton to her breast, but Dr. Basilio Cairo notified and ordered an anti-emetic for her and instructed to inform patient that there is a GI virus in the community and she needs to monitor for any diarrhea or overt N&V. Given the [facility] number to call if symptoms worsen.  She is afebrile today.

## 2012-09-20 ENCOUNTER — Ambulatory Visit
Admission: RE | Admit: 2012-09-20 | Discharge: 2012-09-20 | Disposition: A | Discharge status: Home / Self Care | Payer: BC Managed Care – PPO | Source: Ambulatory Visit | Attending: Radiation Oncology | Admitting: Radiation Oncology

## 2012-09-21 ENCOUNTER — Encounter: Payer: Self-pay | Admitting: Radiation Oncology

## 2012-09-21 ENCOUNTER — Ambulatory Visit
Admission: RE | Admit: 2012-09-21 | Discharge: 2012-09-21 | Disposition: A | Discharge status: Home / Self Care | Payer: BC Managed Care – PPO | Source: Ambulatory Visit | Attending: Radiation Oncology | Admitting: Radiation Oncology

## 2012-09-21 VITALS — BP 121/45 | HR 70 | Resp 16 | Wt 143.1 lb

## 2012-09-21 DIAGNOSIS — C50319 Malignant neoplasm of lower-inner quadrant of unspecified female breast: Secondary | ICD-10-CM

## 2012-09-21 NOTE — Progress Notes (Signed)
Patient presents to the clinic today unaccompanied for PUT with Dr. Michell Heinrich. Patient alert and oriented to person, place, and time. No distress noted. Steady gait noted. Pleasant affect noted. Patient denies pain at this time. However, patient reports occasional sharp brief shooting pains in her right breast but, understand this is related to healing. Patient denies skin changes. Patient given radiaplex but, reports that she has not began to use it yet. Patient denies change in energy level. Reported all findings to Dr. Michell Heinrich.

## 2012-09-21 NOTE — Progress Notes (Addendum)
Weekly Management Note Current Dose: 8.01  Gy  Projected Dose: 42.72 Gy   Narrative:  The patient presents for routine under treatment assessment.  CBCT/MVCT images/Port film x-rays were reviewed.  The chart was checked. Doing well. Not using radiaplex. Some occasional right breast pain.   Physical Findings: Weight: 143 lb 1.6 oz (64.91 kg). Unchanged  Impression:  The patient is tolerating radiation.  Plan:  Continue treatment as planned. Encouraged to use radiaplex.

## 2012-09-22 ENCOUNTER — Ambulatory Visit
Admission: RE | Admit: 2012-09-22 | Discharge: 2012-09-22 | Disposition: A | Discharge status: Home / Self Care | Payer: BC Managed Care – PPO | Source: Ambulatory Visit | Attending: Radiation Oncology | Admitting: Radiation Oncology

## 2012-09-23 ENCOUNTER — Ambulatory Visit
Admission: RE | Admit: 2012-09-23 | Discharge: 2012-09-23 | Disposition: A | Discharge status: Home / Self Care | Payer: BC Managed Care – PPO | Source: Ambulatory Visit | Attending: Radiation Oncology | Admitting: Radiation Oncology

## 2012-09-24 ENCOUNTER — Ambulatory Visit
Admission: RE | Admit: 2012-09-24 | Discharge: 2012-09-24 | Disposition: A | Discharge status: Home / Self Care | Payer: BC Managed Care – PPO | Source: Ambulatory Visit | Attending: Radiation Oncology | Admitting: Radiation Oncology

## 2012-09-27 ENCOUNTER — Ambulatory Visit
Admission: RE | Admit: 2012-09-27 | Discharge: 2012-09-27 | Disposition: A | Discharge status: Home / Self Care | Payer: BC Managed Care – PPO | Source: Ambulatory Visit | Attending: Radiation Oncology | Admitting: Radiation Oncology

## 2012-09-28 ENCOUNTER — Ambulatory Visit
Admission: RE | Admit: 2012-09-28 | Discharge: 2012-09-28 | Disposition: A | Discharge status: Home / Self Care | Payer: BC Managed Care – PPO | Source: Ambulatory Visit | Attending: Radiation Oncology | Admitting: Radiation Oncology

## 2012-09-29 ENCOUNTER — Encounter: Payer: Self-pay | Admitting: Radiation Oncology

## 2012-09-29 ENCOUNTER — Ambulatory Visit
Admission: RE | Admit: 2012-09-29 | Discharge: 2012-09-29 | Disposition: A | Discharge status: Home / Self Care | Payer: BC Managed Care – PPO | Source: Ambulatory Visit | Attending: Radiation Oncology | Admitting: Radiation Oncology

## 2012-09-29 VITALS — BP 119/71 | HR 80 | Temp 98.5°F | Wt 140.1 lb

## 2012-09-29 DIAGNOSIS — C50319 Malignant neoplasm of lower-inner quadrant of unspecified female breast: Secondary | ICD-10-CM

## 2012-09-29 NOTE — Progress Notes (Signed)
Weekly Management Note Current Dose: 26.7  Gy  Projected Dose: 42.72 Gy   Narrative:  The patient presents for routine under treatment assessment.  CBCT/MVCT images/Port film x-rays were reviewed.  The chart was checked. Doing well. No complaints. Working. States she is using radiaplex.   Physical Findings: Weight: 140 lb 1.6 oz (63.549 kg). Slightly dark right breast.  Impression:  The patient is tolerating radiation.  Plan:  Continue treatment as planned. Continue radiaplex. Pt agreeable to 2 massage coupons.

## 2012-09-29 NOTE — Progress Notes (Signed)
Amber [name] has received 10 fractions to  Right breast and has erythema with mild hyperpigmentation, but skin is soft and intact.  Denies any pain.

## 2012-09-30 ENCOUNTER — Ambulatory Visit
Admission: RE | Admit: 2012-09-30 | Discharge: 2012-09-30 | Disposition: A | Discharge status: Home / Self Care | Payer: BC Managed Care – PPO | Source: Ambulatory Visit | Attending: Radiation Oncology | Admitting: Radiation Oncology

## 2012-09-30 ENCOUNTER — Encounter: Payer: Self-pay | Admitting: Radiation Oncology

## 2012-10-01 ENCOUNTER — Ambulatory Visit
Admission: RE | Admit: 2012-10-01 | Discharge: 2012-10-01 | Disposition: A | Discharge status: Home / Self Care | Payer: BC Managed Care – PPO | Source: Ambulatory Visit | Attending: Radiation Oncology | Admitting: Radiation Oncology

## 2012-10-04 ENCOUNTER — Ambulatory Visit
Admission: RE | Admit: 2012-10-04 | Discharge: 2012-10-04 | Disposition: A | Discharge status: Home / Self Care | Payer: BC Managed Care – PPO | Source: Ambulatory Visit | Attending: Radiation Oncology | Admitting: Radiation Oncology

## 2012-10-04 ENCOUNTER — Other Ambulatory Visit (HOSPITAL_BASED_OUTPATIENT_CLINIC_OR_DEPARTMENT_OTHER): Payer: BC Managed Care – PPO | Admitting: Lab

## 2012-10-04 ENCOUNTER — Ambulatory Visit (HOSPITAL_BASED_OUTPATIENT_CLINIC_OR_DEPARTMENT_OTHER): Payer: BC Managed Care – PPO | Admitting: Oncology

## 2012-10-04 ENCOUNTER — Encounter: Payer: Self-pay | Admitting: Oncology

## 2012-10-04 ENCOUNTER — Telehealth: Payer: Self-pay | Admitting: Oncology

## 2012-10-04 VITALS — BP 110/64 | HR 75 | Temp 98.5°F | Resp 18 | Ht 65.0 in | Wt 140.2 lb

## 2012-10-04 DIAGNOSIS — C50319 Malignant neoplasm of lower-inner quadrant of unspecified female breast: Secondary | ICD-10-CM

## 2012-10-04 DIAGNOSIS — Z170 Estrogen receptor positive status [ER+]: Secondary | ICD-10-CM

## 2012-10-04 LAB — CBC WITH DIFFERENTIAL/PLATELET
Basophils Absolute: 0.1 10*3/uL (ref 0.0–0.1)
Eosinophils Absolute: 0.3 10*3/uL (ref 0.0–0.5)
HCT: 35.9 % (ref 34.8–46.6)
HGB: 12 g/dL (ref 11.6–15.9)
LYMPH%: 21 % (ref 14.0–49.7)
MCV: 85 fL (ref 79.5–101.0)
MONO#: 0.6 10*3/uL (ref 0.1–0.9)
MONO%: 8 % (ref 0.0–14.0)
NEUT#: 4.5 10*3/uL (ref 1.5–6.5)
NEUT%: 65.2 % (ref 38.4–76.8)
Platelets: 304 10*3/uL (ref 145–400)
RBC: 4.22 10*6/uL (ref 3.70–5.45)
WBC: 6.9 10*3/uL (ref 3.9–10.3)

## 2012-10-04 LAB — COMPREHENSIVE METABOLIC PANEL (CC13)
Alkaline Phosphatase: 81 U/L (ref 40–150)
BUN: 11 mg/dL (ref 7.0–26.0)
CO2: 22 mEq/L (ref 22–29)
Creatinine: 0.7 mg/dL (ref 0.6–1.1)
Glucose: 113 mg/dl — ABNORMAL HIGH (ref 70–99)
Sodium: 140 mEq/L (ref 136–145)
Total Bilirubin: 0.24 mg/dL (ref 0.20–1.20)
Total Protein: 7.1 g/dL (ref 6.4–8.3)

## 2012-10-04 NOTE — Progress Notes (Signed)
OFFICE PROGRESS NOTE  CC  Allean Found, MD [address] [place] [address] Dr. Claud Kelp Dr. Lavell Luster Dr. Gunnar Bulla  DIAGNOSIS: 52 year old female with stage I invasive ductal carcinoma of the right breast status post central lumpectomy with sentinel lymph node biopsy on 07/15/2012.  PRIOR THERAPY:  #1 patient was seen originally by me on 06/30/2012 with new diagnosis of a 5 mm mass in the right breast at the 5:00 position. The biopsy showed invasive ductal carcinoma with associated DCIS tumor was ER +100% PR +60% Ki-67 6% and HER-2/neu negative. It was grade 1.  #2 patient has gone on to have a central lumpectomy on 07/15/2012 the final pathology revealed a 0.6 cm ER positive PR positive HER-2/neu negative low grade invasive ductal carcinoma. Sentinel node was negative for metastatic disease. Postoperatively she is doing well without any problems.  #3 patient is also scheduled to have left hip surgery performed on 08/17/2012. She is cleared from my perspective to proceed with this first.  #4 once she has her surgery she will be seen by Dr. Lurline Hare who will do radiation therapy adjuvantly to the right breast.  CURRENT THERAPY: Patient will proceed with surgery then radiation therapy.  INTERVAL HISTORY: Amber [name] 52 y.o. female returns for followup visit post lumpectomy overall she is doing well she is without any significant complaints. She denies any nausea vomiting fevers chills. Overall she tolerated her surgery well. She and I have discussed her final pathology.  MEDICAL HISTORY: Past Medical History  Diagnosis Date  . Hyperlipidemia   . Osteoporosis   . Hypertension     Dr. [facility]  . GERD (gastroesophageal reflux disease)     hx of   . Neuromuscular disorder     carpal tunnel right hand  . Chronic neck pain     hx of  . Hypothyroidism   . Heart attack 2001    Dr. Isabel Caprice  . Anginal pain 2001  .  Pernicious anemia   . Sickle cell trait   . Migraines     "last one was 1990's" (08/17/2012)  . DJD (degenerative joint disease) of hip     bil hip  . Breast cancer     invasive ductal carcinoma right breast; ER, PR + and HER 2 -    ALLERGIES:  is allergic to betadine and tramadol.  MEDICATIONS:  Current Outpatient Prescriptions  Medication Sig Dispense Refill  . alendronate (FOSAMAX) 70 MG tablet Take 70 mg by mouth every 7 (seven) days. Take with a full glass of water on an empty stomach. Saturday      . aspirin EC 325 MG EC tablet Take 1 tablet (325 mg total) by mouth 2 (two) times daily.  60 tablet  1  . atenolol (TENORMIN) 25 MG tablet Take 25 mg by mouth daily.      Marland Kitchen atorvastatin (LIPITOR) 80 MG tablet Take 80 mg by mouth at bedtime.      Marland Kitchen BIOTIN PO Take 2 tablets by mouth daily.      . Cholecalciferol (VITAMIN D PO) Take 1 tablet by mouth daily.      . Coenzyme Q10 (COQ10 PO) Take 1 tablet by mouth daily.      . cyanocobalamin (,VITAMIN B-12,) 1000 MCG/ML injection Inject 1,000 mcg into the muscle every 30 (thirty) days. As needed when feeling tired      . cyclobenzaprine (FLEXERIL) 10 MG tablet Take 10 mg by mouth 3 (three) times  daily as needed. For muscle pain      . docusate sodium 100 MG CAPS Take 100 mg by mouth 2 (two) times daily.  30 capsule  1  . ezetimibe (ZETIA) 10 MG tablet Take 1 tablet (10 mg total) by mouth daily.  30 tablet  1  . GLUCOSAMINE-CHONDROITIN PO Take 1 tablet by mouth daily.      Marland Kitchen HYDROcodone-acetaminophen (NORCO/VICODIN) 5-325 MG per tablet Take 1-2 tablets by mouth every 4 (four) hours as needed (breakthrough pain).  80 tablet  0  . hydrOXYzine (ATARAX/VISTARIL) 25 MG tablet       . IRON PO Take 1 tablet by mouth at bedtime as needed. When feeling tired      . levothyroxine (SYNTHROID, LEVOTHROID) 112 MCG tablet Take 125 mcg by mouth every morning.       Marland Kitchen lisinopril (PRINIVIL,ZESTRIL) 5 MG tablet Take 5 mg by mouth daily.      . mometasone  (NASONEX) 50 MCG/ACT nasal spray Place 2 sprays into the nose daily as needed. For allergies      . Omega-3 Fatty Acids (FISH OIL PO) Take 1 capsule by mouth daily.      . penicillin v potassium (VEETID) 500 MG tablet Per pt to take before dental appt and after      . promethazine (PHENERGAN) 25 MG tablet Take 1 tablet (25 mg total) by mouth every 6 (six) hours as needed for nausea.  30 tablet  0  . triamcinolone (KENALOG) 0.1 % paste         SURGICAL HISTORY:  Past Surgical History  Procedure Date  . Nasal sinus surgery 1990  . Coronary angioplasty with stent placement 2001    "1" (08/17/2012)  . Partial mastectomy with needle localization and axillary sentinel lymph node bx 07/15/2012    Procedure: PARTIAL MASTECTOMY WITH NEEDLE LOCALIZATION AND AXILLARY SENTINEL LYMPH NODE BX;  Surgeon: Ernestene Mention, MD;  Location: [facility] OR;  Service: General;  Laterality: Right;  . Total hip arthroplasty 08/17/2012    "right" (08/17/2012)  . Partial mastectomy with needle localization 07/2012    "right" (08/17/2012)  . Breast biopsy 06/2012    "right" (08/17/2012)  . Mastectomy   . Total hip arthroplasty 08/17/2012    Procedure: TOTAL HIP ARTHROPLASTY ANTERIOR APPROACH;  Surgeon: Velna Ochs, MD;  Location: [facility] OR;  Service: Orthopedics;  Laterality: Right;    REVIEW OF SYSTEMS:  Pertinent items are noted in HPI.   HEALTH MAINTENANCE:  PHYSICAL EXAMINATION: Blood pressure 110/64, pulse 75, temperature 98.5 F (36.9 C), temperature source Oral, resp. rate 18, height 5\' 5"  (1.651 m), weight 140 lb 3.2 oz (63.594 kg). Body mass index is 23.33 kg/(m^2). ECOG PERFORMANCE STATUS: 1 - Symptomatic but completely ambulatory   General appearance: alert, cooperative and appears stated age Neck: no adenopathy, no carotid bruit, no JVD, supple, symmetrical, trachea midline and thyroid not enlarged, symmetric, no tenderness/mass/nodules Lymph nodes: Cervical, supraclavicular, and axillary nodes  normal. Resp: clear to auscultation bilaterally Back: symmetric, no curvature. ROM normal. No CVA tenderness. Cardio: regular rate and rhythm GI: soft, non-tender; bowel sounds normal; no masses,  no organomegaly Extremities: extremities normal, atraumatic, no cyanosis or edema Neurologic: Grossly normal Right breast reveals a well-healed central lumpectomy scar there is no redness or erythema.  LABORATORY DATA: Lab Results  Component Value Date   WBC 6.9 10/04/2012   HGB 12.0 10/04/2012   HCT 35.9 10/04/2012   MCV 85.0 10/04/2012   PLT 304 10/04/2012  Chemistry      Component Value Date/Time   NA 140 10/04/2012 0941   NA 141 08/18/2012 0545   K 3.8 10/04/2012 0941   K 4.1 08/18/2012 0545   CL 108* 10/04/2012 0941   CL 103 08/18/2012 0545   CO2 22 10/04/2012 0941   CO2 27 08/18/2012 0545   BUN 11.0 10/04/2012 0941   BUN 6 08/18/2012 0545   CREATININE 0.7 10/04/2012 0941   CREATININE 0.65 08/18/2012 0545      Component Value Date/Time   CALCIUM 9.6 10/04/2012 0941   CALCIUM 9.6 08/18/2012 0545   ALKPHOS 81 10/04/2012 0941   ALKPHOS 77 07/09/2012 1555   AST 15 10/04/2012 0941   AST 24 07/09/2012 1555   ALT 12 10/04/2012 0941   ALT 20 07/09/2012 1555   BILITOT 0.24 10/04/2012 0941   BILITOT 0.2* 07/09/2012 1555    Diagnosis 1. Lymph node, sentinel, biopsy, Right axillary - THERE IS NO EVIDENCE OF CARCINOMA IN 1 OF 1 LYMPH NODE (0/1). 2. Lymph node, sentinel, biopsy, Right axillary - THERE IS NO EVIDENCE OF CARCINOMA IN 1 OF 1 LYMPH NODE (0/1). 3. Breast, partial mastectomy, Right - INVASIVE DUCTAL CARCINOMA, GRADE I/III, SPANNING 0.6 CM. - THE SURGICAL RESECTION MARGINS ARE NEGATIVE FOR CARCINOMA. - SEE ONCOLOGY TABLE BELOW. Microscopic Comment 3. BREAST, INVASIVE TUMOR, WITH LYMPH NODE SAMPLING Specimen, including laterality: Right breast. Procedure: Mastectomy. Grade: I Tubule formation: 1 Nuclear pleomorphism: 2 Mitotic:1 Tumor size (gross measurement): 0.6 cm Margins: Negative for  carcinoma Invasive, distance to closest margin: 2.0 cm to the posterior margin (gross measurement) Lymphovascular invasion: Not identified. Ductal carcinoma in situ: Not identified. Lobular neoplasia: Not identified. Tumor focality: Unifocal Treatment effect: N/A Extent of tumor: Confined to breast parenchyma. Lymph nodes: # examined: 2 Lymph nodes with metastasis: 0 Breast prognostic profile: [id number] 1 of 3 FINAL for JORDANE, HISLE ([id number]) Microscopic Comment(continued) Estrogen receptor: 100%, strong staining intensity. Progesterone receptor: 60%, strong staining intensity. Her 2 neu: No amplification was detected. The ratio was 1.11. Her 2 neu by [facility] will be repeated on the current case and the results reported separately. Ki-67: 6%. Non-neoplastic breast: Adenosis with calcifications, fibrocystic changes, and healing biopsy site. TNM: pT1b, pN0 (JBK:gt, 07/19/12) Pecola Leisure MD Pathologist, Electronic Signature (Case signed 07/19/2012) Specimen Gross and Clinical Information Specimen(s) Obtained: 1. Lymph node, sentinel, biopsy, Right axillary 2. Lymph node, sentinel, biopsy, Right axillary 3. Breast, partial mastectomy, Right Specimen Clinical Information   RADIOGRAPHIC STUDIES:  Chest 2 View  07/09/2012  *RADIOLOGY REPORT*  Clinical Data: Preoperative examination for right breast lumpectomy.  History of myocardial infarction.  Hypertension and smoking history.  CHEST - 2 VIEW  Comparison: 11/22/2006 to  Findings: The heart is mildly enlarged.  Mediastinal shadows are otherwise normal.  There are coronary artery stents.  The vascularity is normal.  Lungs are clear.  No effusions.  No bony abnormality.  IMPRESSION: Mild cardiomegaly.  Coronary artery stents.  No active disease evident.   Original Report Authenticated By: Paulina Fusi, M.D.    Nm Sentinel Node Inj-no Rpt (breast)  07/15/2012  CLINICAL DATA: Cancer right breast   Sulfur colloid was injected  intradermally by the nuclear medicine  technologist for breast cancer sentinel node localization.     [place] Wire Localization Right  07/15/2012  *RADIOLOGY REPORT*  Clinical Data:  Recently diagnosed cancer at 5 o'clock in the right subareolar region.  RIGHT BREAST NEEDLE LOCALIZATION USING ULTRASOUND GUIDANCE AND SPECIMEN RADIOGRAPH  Patient presents for needle  localization prior to surgical excision. The patient and I discussed the procedure of needle localization including benefits and alternatives. We discussed the high likelihood of a successful procedure. We discussed the risks of the procedure, including infection, bleeding, tissue injury, and further surgery. Informed written consent was given.  Using ultrasound guidance, sterile technique, 2% lidocaine, and a 5 cm modified Kopans needle, the mass at 5 o'clock in the subareolar region localized using a mediolateral approach. Films were labeled and sent with the patient to surgery.  She tolerated procedure well.  Specimen radiograph is performed at [facility] operating room and confirms the mass, clip, and wire to be present in the tissue sample.  The specimen is marked for pathology.  IMPRESSION: Needle localization right breast.  No apparent complications.   Original Report Authenticated By: Cain Saupe, M.D.    Mm Breast Surgical Specimen  07/15/2012  *RADIOLOGY REPORT*  Clinical Data:  Recently diagnosed cancer at 5 o'clock in the right subareolar region.  RIGHT BREAST NEEDLE LOCALIZATION USING ULTRASOUND GUIDANCE AND SPECIMEN RADIOGRAPH  Patient presents for needle localization prior to surgical excision. The patient and I discussed the procedure of needle localization including benefits and alternatives. We discussed the high likelihood of a successful procedure. We discussed the risks of the procedure, including infection, bleeding, tissue injury, and further surgery. Informed written consent was given.  Using ultrasound guidance,  sterile technique, 2% lidocaine, and a 5 cm modified Kopans needle, the mass at 5 o'clock in the subareolar region localized using a mediolateral approach. Films were labeled and sent with the patient to surgery.  She tolerated procedure well.  Specimen radiograph is performed at [facility] operating room and confirms the mass, clip, and wire to be present in the tissue sample.  The specimen is marked for pathology.  IMPRESSION: Needle localization right breast.  No apparent complications.   Original Report Authenticated By: Cain Saupe, M.D.     ASSESSMENT: 52 year old female with  #1 stage I invasive ductal carcinoma of the right breast ER positive PR positive HER-2/neu negative with a low Ki-67 grade 1. Status post central lumpectomy with sentinel lymph node biopsy. Lymph nodes were negative for metastatic disease. Postoperatively patient is doing well.  #2 patient with significant arthritis s/p hip replacement in December.  #3 patient now on  adjuvant radiation and   #4 . She will continue radiation and then when she completes this we will proceed with antiestrogen therapy with either tamoxifen or an aromatase inhibitor.   PLAN:   #1 we will see her back in March to begin anti-estrogen therapy  All questions were answered. The patient knows to call the clinic with any problems, questions or concerns. We can certainly see the patient much sooner if necessary.  I spent 15 minutes counseling the patient face to face. The total time spent in the appointment was 30 minutes.    Drue Second, MD Medical/Oncology [facility] [phone] (beeper) [phone] (Office)  10/04/2012, 10:31 AM

## 2012-10-04 NOTE — Patient Instructions (Addendum)
Finish up RT  We will see you back in 1 month

## 2012-10-04 NOTE — Telephone Encounter (Signed)
gv pt appt schedule for March. °

## 2012-10-05 ENCOUNTER — Ambulatory Visit: Payer: BC Managed Care – PPO

## 2012-10-05 ENCOUNTER — Ambulatory Visit: Admission: RE | Admit: 2012-10-05 | Payer: BC Managed Care – PPO | Source: Ambulatory Visit

## 2012-10-06 ENCOUNTER — Ambulatory Visit
Admission: RE | Admit: 2012-10-06 | Discharge: 2012-10-06 | Disposition: A | Discharge status: Home / Self Care | Payer: BC Managed Care – PPO | Source: Ambulatory Visit | Attending: Radiation Oncology | Admitting: Radiation Oncology

## 2012-10-07 ENCOUNTER — Ambulatory Visit
Admission: RE | Admit: 2012-10-07 | Discharge: 2012-10-07 | Disposition: A | Discharge status: Home / Self Care | Payer: BC Managed Care – PPO | Source: Ambulatory Visit | Attending: Radiation Oncology | Admitting: Radiation Oncology

## 2012-10-07 DIAGNOSIS — C50319 Malignant neoplasm of lower-inner quadrant of unspecified female breast: Secondary | ICD-10-CM

## 2012-10-07 NOTE — Progress Notes (Signed)
Weekly Management Note Current Dose: 40.05  Gy  Projected Dose: 42.72 Gy   Narrative:  The patient presents for routine under treatment assessment.  CBCT/MVCT images/Port film x-rays were reviewed.  The chart was checked. I saw the patient in the treatment machine and set up her boost. She is doing well. She is having some soreness in her breast. She picked up some hydrogel patch yesterday.   Physical Findings: Weight:  . Her breast is slightly dark.  Impression:  The patient is tolerating radiation.  Plan:  Continue treatment as planned.

## 2012-10-08 ENCOUNTER — Ambulatory Visit
Admission: RE | Admit: 2012-10-08 | Discharge: 2012-10-08 | Disposition: A | Discharge status: Home / Self Care | Payer: BC Managed Care – PPO | Source: Ambulatory Visit | Attending: Radiation Oncology | Admitting: Radiation Oncology

## 2012-10-08 ENCOUNTER — Ambulatory Visit: Payer: BC Managed Care – PPO | Admitting: Radiation Oncology

## 2012-10-11 ENCOUNTER — Ambulatory Visit
Admission: RE | Admit: 2012-10-11 | Discharge: 2012-10-11 | Disposition: A | Discharge status: Home / Self Care | Payer: BC Managed Care – PPO | Source: Ambulatory Visit | Attending: Radiation Oncology | Admitting: Radiation Oncology

## 2012-10-12 ENCOUNTER — Ambulatory Visit
Admission: RE | Admit: 2012-10-12 | Discharge: 2012-10-12 | Disposition: A | Discharge status: Home / Self Care | Payer: BC Managed Care – PPO | Source: Ambulatory Visit | Attending: Radiation Oncology | Admitting: Radiation Oncology

## 2012-10-12 VITALS — BP 118/51 | HR 59 | Temp 98.5°F | Wt 141.6 lb

## 2012-10-12 DIAGNOSIS — C50319 Malignant neoplasm of lower-inner quadrant of unspecified female breast: Secondary | ICD-10-CM

## 2012-10-12 NOTE — Progress Notes (Signed)
Patient here for weekly assessment prior to treatment.Has 3 more treatments remaining after today.Skin is mildly hyperpigmented.Some soreness of right axilla fold.Moderate fatigue.

## 2012-10-12 NOTE — Progress Notes (Signed)
Weekly Management Note Current Dose: 46.72  Gy  Projected Dose: 60.4 Gy   Narrative:  The patient presents for routine under treatment assessment.  CBCT/MVCT images/Port film x-rays were reviewed.  The chart was checked. Doing well. Some irritation under axilla. Some fatigue but going to zumba tonight.   Physical Findings: Weight: 141 lb 9.6 oz (64.229 kg). Unchanged. Hyper pigmented breast. Inframammary fold is clear.  Impression:  The patient is tolerating radiation.  Plan:  Continue treatment as planned. Continue radiaplex for 2 weeks after treatment. Lotion with vit after 2 weeks. F/u in 1 month. F/u with khan scheduled in march.

## 2012-10-13 ENCOUNTER — Ambulatory Visit
Admission: RE | Admit: 2012-10-13 | Discharge: 2012-10-13 | Disposition: A | Discharge status: Home / Self Care | Payer: BC Managed Care – PPO | Source: Ambulatory Visit | Attending: Radiation Oncology | Admitting: Radiation Oncology

## 2012-10-14 ENCOUNTER — Ambulatory Visit: Payer: BC Managed Care – PPO

## 2012-10-15 ENCOUNTER — Ambulatory Visit
Admission: RE | Admit: 2012-10-15 | Discharge: 2012-10-15 | Disposition: A | Discharge status: Home / Self Care | Payer: BC Managed Care – PPO | Source: Ambulatory Visit | Attending: Radiation Oncology | Admitting: Radiation Oncology

## 2012-10-15 ENCOUNTER — Ambulatory Visit (HOSPITAL_BASED_OUTPATIENT_CLINIC_OR_DEPARTMENT_OTHER): Payer: BC Managed Care – PPO | Admitting: Adult Health

## 2012-10-15 ENCOUNTER — Ambulatory Visit: Payer: BC Managed Care – PPO

## 2012-10-15 ENCOUNTER — Encounter: Payer: Self-pay | Admitting: Adult Health

## 2012-10-15 ENCOUNTER — Telehealth: Payer: Self-pay | Admitting: Oncology

## 2012-10-15 VITALS — BP 104/68 | HR 61 | Temp 97.4°F

## 2012-10-15 DIAGNOSIS — Z170 Estrogen receptor positive status [ER+]: Secondary | ICD-10-CM

## 2012-10-15 DIAGNOSIS — C50319 Malignant neoplasm of lower-inner quadrant of unspecified female breast: Secondary | ICD-10-CM

## 2012-10-15 NOTE — Progress Notes (Signed)
OFFICE PROGRESS NOTE  CC  Allean Found, MD [address] [place] [address] Dr. Claud Kelp Dr. Lavell Luster Dr. Gunnar Bulla  DIAGNOSIS: 52 year old female with stage I invasive ductal carcinoma of the right breast status post central lumpectomy with sentinel lymph node biopsy on 07/15/2012.  PRIOR THERAPY:  #1 patient was seen originally by me on 06/30/2012 with new diagnosis of a 5 mm mass in the right breast at the 5:00 position. The biopsy showed invasive ductal carcinoma with associated DCIS tumor was ER +100% PR +60% Ki-67 6% and HER-2/neu negative. It was grade 1.  #2 patient has gone on to have a central lumpectomy on 07/15/2012 the final pathology revealed a 0.6 cm ER positive PR positive HER-2/neu negative low grade invasive ductal carcinoma. Sentinel node was negative for metastatic disease. Postoperatively she is doing well without any problems.  #3 patient is also scheduled to have left hip surgery performed on 08/17/2012. She is cleared from my perspective to proceed with this first.  #4 once she has her surgery she will be seen by Dr. Lurline Hare who will do radiation therapy adjuvantly to the right breast.  CURRENT THERAPY: Radiation  INTERVAL HISTORY: [name] 52 y.o. female is here today as a work in appointment due to a lump she is very worried about in her right breast.  She first felt this lump yesterday and describes it as hard and quarter sized.  She denies any pain, discharge, or other changes in her breast.  She is also undergoing radiation therapy to the right breast.  She is tolerating the therapy well with the exception of skin irritation.    MEDICAL HISTORY: Past Medical History  Diagnosis Date  . Hyperlipidemia   . Osteoporosis   . Hypertension     Dr[facility]  . GERD (gastroesophageal reflux disease)     hx of   . Neuromuscular disorder     carpal tunnel right hand  . Chronic neck pain     hx of   . Hypothyroidism   . Heart attack 2001    Dr. Isabel Caprice  . Anginal pain 2001  . Pernicious anemia   . Sickle cell trait   . Migraines     "last one was 1990's" (08/17/2012)  . DJD (degenerative joint disease) of hip     bil hip  . Breast cancer     invasive ductal carcinoma right breast; ER, PR + and HER 2 -    ALLERGIES:  is allergic to betadine and tramadol.  MEDICATIONS:  Current Outpatient Prescriptions  Medication Sig Dispense Refill  . alendronate (FOSAMAX) 70 MG tablet Take 70 mg by mouth every 7 (seven) days. Take with a full glass of water on an empty stomach. Saturday      . aspirin EC 325 MG EC tablet Take 1 tablet (325 mg total) by mouth 2 (two) times daily.  60 tablet  1  . atenolol (TENORMIN) 25 MG tablet Take 25 mg by mouth daily.      Marland Kitchen BIOTIN PO Take 2 tablets by mouth daily.      . Cholecalciferol (VITAMIN D PO) Take 1 tablet by mouth daily.      . Coenzyme Q10 (COQ10 PO) Take 1 tablet by mouth daily.      . cyanocobalamin (,VITAMIN B-12,) 1000 MCG/ML injection Inject 1,000 mcg into the muscle every 30 (thirty) days. As needed when feeling tired      . cyclobenzaprine (FLEXERIL)  10 MG tablet Take 10 mg by mouth 3 (three) times daily as needed. For muscle pain      . docusate sodium 100 MG CAPS Take 100 mg by mouth 2 (two) times daily.  30 capsule  1  . ezetimibe (ZETIA) 10 MG tablet Take 1 tablet (10 mg total) by mouth daily.  30 tablet  1  . GLUCOSAMINE-CHONDROITIN PO Take 1 tablet by mouth daily.      . hydrOXYzine (ATARAX/VISTARIL) 25 MG tablet       . levothyroxine (SYNTHROID, LEVOTHROID) 112 MCG tablet Take 125 mcg by mouth every morning.       Marland Kitchen lisinopril (PRINIVIL,ZESTRIL) 5 MG tablet Take 5 mg by mouth daily.      . mometasone (NASONEX) 50 MCG/ACT nasal spray Place 2 sprays into the nose daily as needed. For allergies      . Omega-3 Fatty Acids (FISH OIL PO) Take 1 capsule by mouth daily.      . rosuvastatin (CRESTOR) 20 MG tablet Take 20 mg by mouth  daily.      Marland Kitchen HYDROcodone-acetaminophen (NORCO/VICODIN) 5-325 MG per tablet Take 1-2 tablets by mouth every 4 (four) hours as needed (breakthrough pain).  80 tablet  0  . IRON PO Take 1 tablet by mouth at bedtime as needed. When feeling tired      . penicillin v potassium (VEETID) 500 MG tablet Per pt to take before dental appt and after      . promethazine (PHENERGAN) 25 MG tablet Take 1 tablet (25 mg total) by mouth every 6 (six) hours as needed for nausea.  30 tablet  0  . triamcinolone (KENALOG) 0.1 % paste        No current facility-administered medications for this visit.    SURGICAL HISTORY:  Past Surgical History  Procedure Laterality Date  . Nasal sinus surgery  1990  . Coronary angioplasty with stent placement  2001    "1" (08/17/2012)  . Partial mastectomy with needle localization and axillary sentinel lymph node bx  07/15/2012    Procedure: PARTIAL MASTECTOMY WITH NEEDLE LOCALIZATION AND AXILLARY SENTINEL LYMPH NODE BX;  Surgeon: Ernestene Mention, MD;  Location: [facility] OR;  Service: General;  Laterality: Right;  . Total hip arthroplasty  08/17/2012    "right" (08/17/2012)  . Partial mastectomy with needle localization  07/2012    "right" (08/17/2012)  . Breast biopsy  06/2012    "right" (08/17/2012)  . Mastectomy    . Total hip arthroplasty  08/17/2012    Procedure: TOTAL HIP ARTHROPLASTY ANTERIOR APPROACH;  Surgeon: Velna Ochs, MD;  Location: [facility] OR;  Service: Orthopedics;  Laterality: Right;    REVIEW OF SYSTEMS:   General: fatigue (-), night sweats (-), fever (-), pain (-) Lymph: palpable nodes (-) HEENT: vision changes (-), mucositis (-), gum bleeding (-), epistaxis (-) Cardiovascular: chest pain (-), palpitations (-) Pulmonary: shortness of breath (-), dyspnea on exertion (-), cough (-), hemoptysis (-) GI:  Early satiety (-), melena (-), dysphagia (-), nausea/vomiting (-), diarrhea (-) GU: dysuria (-), hematuria (-), incontinence (-) Musculoskeletal: joint  swelling (-), joint pain (-), back pain (-) Neuro: weakness (-), numbness (-), headache (-), confusion (-) Skin: Rash (-), lesions (-), dryness (-) Psych: depression (-), suicidal/homicidal ideation (-), feeling of hopelessness (-)   PHYSICAL EXAMINATION: Blood pressure 104/68, pulse 61, temperature 97.4 F (36.3 C), temperature source Oral. There is no weight on file to calculate BMI. General: Patient is a well appearing female in no  acute distress HEENT: PERRLA, sclerae anicteric no conjunctival pallor, MMM Neck: supple, no palpable adenopathy Lungs: clear to auscultation bilaterally, no wheezes, rhonchi, or rales Cardiovascular: regular rate rhythm, S1, S2, no murmurs, rubs or gallops Abdomen: Soft, non-tender, non-distended, normoactive bowel sounds, no HSM Extremities: warm and well perfused, no clubbing, cyanosis, or edema Skin: No rashes or lesions Neuro: Non-focal Breasts: right breast central lumpectomy scar well healed, 1cm soft nodule in the central portion of the breast.  Left breast without masses, nodularity, or skin changes. Erythema and slight flaking of right chest wall.  ECOG PERFORMANCE STATUS: 1 - Symptomatic but completely ambulatory    LABORATORY DATA: Lab Results  Component Value Date   WBC 6.9 10/04/2012   HGB 12.0 10/04/2012   HCT 35.9 10/04/2012   MCV 85.0 10/04/2012   PLT 304 10/04/2012      Chemistry      Component Value Date/Time   NA 140 10/04/2012 0941   NA 141 08/18/2012 0545   K 3.8 10/04/2012 0941   K 4.1 08/18/2012 0545   CL 108* 10/04/2012 0941   CL 103 08/18/2012 0545   CO2 22 10/04/2012 0941   CO2 27 08/18/2012 0545   BUN 11.0 10/04/2012 0941   BUN 6 08/18/2012 0545   CREATININE 0.7 10/04/2012 0941   CREATININE 0.65 08/18/2012 0545      Component Value Date/Time   CALCIUM 9.6 10/04/2012 0941   CALCIUM 9.6 08/18/2012 0545   ALKPHOS 81 10/04/2012 0941   ALKPHOS 77 07/09/2012 1555   AST 15 10/04/2012 0941   AST 24 07/09/2012 1555   ALT 12 10/04/2012 0941    ALT 20 07/09/2012 1555   BILITOT 0.24 10/04/2012 0941   BILITOT 0.2* 07/09/2012 1555    Diagnosis 1. Lymph node, sentinel, biopsy, Right axillary - THERE IS NO EVIDENCE OF CARCINOMA IN 1 OF 1 LYMPH NODE (0/1). 2. Lymph node, sentinel, biopsy, Right axillary - THERE IS NO EVIDENCE OF CARCINOMA IN 1 OF 1 LYMPH NODE (0/1). 3. Breast, partial mastectomy, Right - INVASIVE DUCTAL CARCINOMA, GRADE I/III, SPANNING 0.6 CM. - THE SURGICAL RESECTION MARGINS ARE NEGATIVE FOR CARCINOMA. - SEE ONCOLOGY TABLE BELOW. Microscopic Comment 3. BREAST, INVASIVE TUMOR, WITH LYMPH NODE SAMPLING Specimen, including laterality: Right breast. Procedure: Mastectomy. Grade: I Tubule formation: 1 Nuclear pleomorphism: 2 Mitotic:1 Tumor size (gross measurement): 0.6 cm Margins: Negative for carcinoma Invasive, distance to closest margin: 2.0 cm to the posterior margin (gross measurement) Lymphovascular invasion: Not identified. Ductal carcinoma in situ: Not identified. Lobular neoplasia: Not identified. Tumor focality: Unifocal Treatment effect: N/A Extent of tumor: Confined to breast parenchyma. Lymph nodes: # examined: 2 Lymph nodes with metastasis: 0 Breast prognostic profile: [phone] 1 of 3 FINAL for NETTIE, WYFFELS ([id number]) Microscopic Comment(continued) Estrogen receptor: 100%, strong staining intensity. Progesterone receptor: 60%, strong staining intensity. Her 2 neu: No amplification was detected. The ratio was 1.11. Her 2 neu by [facility] will be repeated on the current case and the results reported separately. Ki-67: 6%. Non-neoplastic breast: Adenosis with calcifications, fibrocystic changes, and healing biopsy site. TNM: pT1b, pN0 (JBK:gt, 07/19/12) Pecola Leisure MD Pathologist, Electronic Signature (Case signed 07/19/2012) Specimen Gross and Clinical Information Specimen(s) Obtained: 1. Lymph node, sentinel, biopsy, Right axillary 2. Lymph node, sentinel, biopsy, Right  axillary 3. Breast, partial mastectomy, Right Specimen Clinical Information   RADIOGRAPHIC STUDIES:  Chest 2 View  07/09/2012  *RADIOLOGY REPORT*  Clinical Data: Preoperative examination for right breast lumpectomy.  History of myocardial infarction.  Hypertension and  smoking history.  CHEST - 2 VIEW  Comparison: 11/22/2006 to  Findings: The heart is mildly enlarged.  Mediastinal shadows are otherwise normal.  There are coronary artery stents.  The vascularity is normal.  Lungs are clear.  No effusions.  No bony abnormality.  IMPRESSION: Mild cardiomegaly.  Coronary artery stents.  No active disease evident.   Original Report Authenticated By: Paulina Fusi, M.D.    Nm Sentinel Node Inj-no Rpt (breast)  07/15/2012  CLINICAL DATA: Cancer right breast   Sulfur colloid was injected intradermally by the nuclear medicine  technologist for breast cancer sentinel node localization.     [place] Wire Localization Right  07/15/2012  *RADIOLOGY REPORT*  Clinical Data:  Recently diagnosed cancer at 5 o'clock in the right subareolar region.  RIGHT BREAST NEEDLE LOCALIZATION USING ULTRASOUND GUIDANCE AND SPECIMEN RADIOGRAPH  Patient presents for needle localization prior to surgical excision. The patient and I discussed the procedure of needle localization including benefits and alternatives. We discussed the high likelihood of a successful procedure. We discussed the risks of the procedure, including infection, bleeding, tissue injury, and further surgery. Informed written consent was given.  Using ultrasound guidance, sterile technique, 2% lidocaine, and a 5 cm modified Kopans needle, the mass at 5 o'clock in the subareolar region localized using a mediolateral approach. Films were labeled and sent with the patient to surgery.  She tolerated procedure well.  Specimen radiograph is performed at [facility] operating room and confirms the mass, clip, and wire to be present in the tissue sample.  The specimen is  marked for pathology.  IMPRESSION: Needle localization right breast.  No apparent complications.   Original Report Authenticated By: Cain Saupe, M.D.    Mm Breast Surgical Specimen  07/15/2012  *RADIOLOGY REPORT*  Clinical Data:  Recently diagnosed cancer at 5 o'clock in the right subareolar region.  RIGHT BREAST NEEDLE LOCALIZATION USING ULTRASOUND GUIDANCE AND SPECIMEN RADIOGRAPH  Patient presents for needle localization prior to surgical excision. The patient and I discussed the procedure of needle localization including benefits and alternatives. We discussed the high likelihood of a successful procedure. We discussed the risks of the procedure, including infection, bleeding, tissue injury, and further surgery. Informed written consent was given.  Using ultrasound guidance, sterile technique, 2% lidocaine, and a 5 cm modified Kopans needle, the mass at 5 o'clock in the subareolar region localized using a mediolateral approach. Films were labeled and sent with the patient to surgery.  She tolerated procedure well.  Specimen radiograph is performed at [facility] operating room and confirms the mass, clip, and wire to be present in the tissue sample.  The specimen is marked for pathology.  IMPRESSION: Needle localization right breast.  No apparent complications.   Original Report Authenticated By: Cain Saupe, M.D.     ASSESSMENT: 52 year old female with  #1 stage I invasive ductal carcinoma of the right breast ER positive PR positive HER-2/neu negative with a low Ki-67 grade 1. Status post central lumpectomy with sentinel lymph node biopsy. Lymph nodes were negative for metastatic disease. Postoperatively patient is doing well.  #2 patient with significant arthritis s/p hip replacement in December.  #3 patient now on  adjuvant radiation and   #4 . She will continue radiation and then when she completes this we will proceed with antiestrogen therapy with either tamoxifen or an  aromatase inhibitor.   PLAN:   #1 I ordered a diagnostic mammo for Ms. Rullo to have.  She will see [place] as  scheduled on 11/04/12.    All questions were answered. The patient knows to call the clinic with any problems, questions or concerns. We can certainly see the patient much sooner if necessary.  I spent 25 minutes counseling the patient face to face. The total time spent in the appointment was 30 minutes.    Cherie Ouch Lyn Hollingshead, NP Medical Oncology [facility] Phone: [phone]    10/15/2012, 12:29 PM

## 2012-10-15 NOTE — Patient Instructions (Addendum)
Doing well.  We have ordered a diagnostic mammogram for the breast nodule.  Please keep your already scheduled appointment with Dr. Welton Flakes.  Please call us if you have any questions or concerns.

## 2012-10-15 NOTE — Telephone Encounter (Signed)
, °

## 2012-10-18 ENCOUNTER — Telehealth: Payer: Self-pay | Admitting: Oncology

## 2012-10-18 ENCOUNTER — Ambulatory Visit: Payer: BC Managed Care – PPO

## 2012-10-19 ENCOUNTER — Ambulatory Visit
Admission: RE | Admit: 2012-10-19 | Discharge: 2012-10-19 | Disposition: A | Discharge status: Home / Self Care | Payer: BC Managed Care – PPO | Source: Ambulatory Visit | Attending: Radiation Oncology | Admitting: Radiation Oncology

## 2012-10-19 ENCOUNTER — Encounter: Payer: Self-pay | Admitting: Radiation Oncology

## 2012-10-19 ENCOUNTER — Ambulatory Visit: Payer: BC Managed Care – PPO

## 2012-10-19 DIAGNOSIS — C50319 Malignant neoplasm of lower-inner quadrant of unspecified female breast: Secondary | ICD-10-CM

## 2012-10-19 NOTE — Progress Notes (Signed)
Weekly Management Note Current Dose:  52.72 Gy  Projected Dose: 52.72  Gy   Narrative:  The patient presents for routine under treatment assessment.  CBCT/MVCT images/Port film x-rays were reviewed.  The chart was checked. Doing well. Still sore. Felt scar tissue under incision and wanted me to examine. Has not been smoking for 2 weeks!!!!!!  Physical Findings: Scar tissue under scar. Some dry desquamation. Single area of open skin in inframammary fold  Impression:  Finishes RT today  Plan:  Follow up 1 month. Continue efforts to quit smoking. Normal scar tissue.

## 2012-10-20 ENCOUNTER — Ambulatory Visit: Payer: BC Managed Care – PPO

## 2012-10-20 NOTE — Progress Notes (Signed)
°  Radiation Oncology         (336) [phone] ________________________________  Name: Amber [name] MRN: [id number]  Date: 10/19/2012  DOB: February 03, 1961  End of Treatment Note  Diagnosis:   Right breast cancer (T1bN0)     Indication for treatment:  Curative       Radiation treatment dates:   09/16/2012-10/19/2012  Site/dose:    Right breast / 42.72 Wallace Cullens @ 2.67 Wallace Cullens per fraction x 16 fractions Right breast boost / 10 Gray at [employer] per fraction x 5 fractions  Beams/energy:  Opposed Tangents / 6 MV photons En face / 15 MeV electrons  Narrative: The patient tolerated radiation treatment relatively well.   She had the expected skin darkening. She tolerated otherwise her treatment very well.  Plan: The patient has completed radiation treatment. The patient will return to radiation oncology clinic for routine followup in one month. I advised them to call or return sooner if they have any questions or concerns related to their recovery or treatment.  ------------------------------------------------  Lurline Hare, MD

## 2012-10-20 NOTE — Progress Notes (Signed)
Name: Amber [name]   MRN: [id number]  Date:  09/30/2012   DOB: 04/13/1961  Status:outpatient    DIAGNOSIS: Breast cancer.  CONSENT VERIFIED: yes   SET UP: Patient is setup supine   IMMOBILIZATION:  The following immobilization was used:Custom Moldable Pillow, breast board.   NARRATIVE: Amber [name] underwent complex simulation and treatment planning for her boost treatment today.  Her tumor volume was outlined on the planning CT scan.  15  MeV electrons will be prescribed.  A block will be used for beam modification purposes.  A special port plan is requested.

## 2012-10-20 NOTE — Progress Notes (Signed)
  Radiation Oncology         (336) [phone] ________________________________  Name: Amber [name] MRN: [id number]  Date: 09/15/2012  DOB: 1961-08-29  Simulation Verification Note  Status: outpatient  NARRATIVE: The patient was brought to the treatment unit and placed in the planned treatment position. The clinical setup was verified. Then port films were obtained and uploaded to the radiation oncology medical record software.  The treatment beams were carefully compared against the planned radiation fields. The position location and shape of the radiation fields was reviewed. The targeted volume of tissue appears appropriately covered by the radiation beams. Organs at risk appear to be excluded as planned.  Based on my personal review, I approved the simulation verification. The patient's treatment will proceed as planned.  ------------------------------------------------  Lurline Hare, MD

## 2012-10-21 ENCOUNTER — Ambulatory Visit: Payer: BC Managed Care – PPO

## 2012-10-22 ENCOUNTER — Ambulatory Visit: Payer: BC Managed Care – PPO

## 2012-10-25 ENCOUNTER — Ambulatory Visit: Payer: BC Managed Care – PPO

## 2012-10-26 ENCOUNTER — Ambulatory Visit: Payer: BC Managed Care – PPO

## 2012-10-27 ENCOUNTER — Ambulatory Visit: Payer: BC Managed Care – PPO

## 2012-10-28 ENCOUNTER — Ambulatory Visit: Payer: BC Managed Care – PPO

## 2012-10-29 ENCOUNTER — Ambulatory Visit: Payer: BC Managed Care – PPO

## 2012-11-01 ENCOUNTER — Ambulatory Visit: Payer: BC Managed Care – PPO

## 2012-11-04 ENCOUNTER — Ambulatory Visit (HOSPITAL_BASED_OUTPATIENT_CLINIC_OR_DEPARTMENT_OTHER): Payer: BC Managed Care – PPO | Admitting: Oncology

## 2012-11-04 ENCOUNTER — Encounter: Payer: Self-pay | Admitting: Oncology

## 2012-11-04 VITALS — BP 110/71 | HR 65 | Temp 98.8°F | Resp 20 | Ht 65.0 in | Wt 142.2 lb

## 2012-11-04 DIAGNOSIS — C50319 Malignant neoplasm of lower-inner quadrant of unspecified female breast: Secondary | ICD-10-CM

## 2012-11-04 DIAGNOSIS — Z170 Estrogen receptor positive status [ER+]: Secondary | ICD-10-CM

## 2012-11-04 DIAGNOSIS — M169 Osteoarthritis of hip, unspecified: Secondary | ICD-10-CM

## 2012-11-04 MED ORDER — ANASTROZOLE 1 MG PO TABS: 1.0000 mg | ORAL_TABLET | Freq: Every day | ORAL | Status: DC

## 2012-11-04 NOTE — Patient Instructions (Addendum)
Proceed with arimidex  Bone density  Anastrozole tablets What is this medicine? ANASTROZOLE (an AS troe zole) is used to treat breast cancer in women who have gone through menopause. Some types of breast cancer depend on estrogen to grow, and this medicine can stop tumor growth by blocking estrogen production. This medicine may be used for other purposes; ask your health care provider or pharmacist if you have questions. What should I tell my health care provider before I take this medicine? They need to know if you have any of these conditions: -liver disease -an unusual or allergic reaction to anastrozole, other medicines, foods, dyes, or preservatives -pregnant or trying to get pregnant -breast-feeding How should I use this medicine? Take this medicine by mouth with a glass of water. Follow the directions on the prescription label. You can take this medicine with or without food. Take your doses at regular intervals. Do not take your medicine more often than directed. Do not stop taking except on the advice of your doctor or health care professional. Talk to your pediatrician regarding the use of this medicine in children. Special care may be needed. Overdosage: If you think you have taken too much of this medicine contact a poison control center or emergency room at once. NOTE: This medicine is only for you. Do not share this medicine with others. What if I miss a dose? If you miss a dose, take it as soon as you can. If it is almost time for your next dose, take only that dose. Do not take double or extra doses. What may interact with this medicine? Do not take this medicine with any of the following medications: -female hormones, like estrogens or progestins and birth control pills This medicine may also interact with the following medications: -tamoxifen This list may not describe all possible interactions. Give your health care provider a list of all the medicines, herbs,  non-prescription drugs, or dietary supplements you use. Also tell them if you smoke, drink alcohol, or use illegal drugs. Some items may interact with your medicine. What should I watch for while using this medicine? Visit your doctor or health care professional for regular checks on your progress. Let your doctor or health care professional know about any unusual vaginal bleeding. Do not treat yourself for diarrhea, nausea, vomiting or other side effects. Ask your doctor or health care professional for advice. What side effects may I notice from receiving this medicine? Side effects that you should report to your doctor or health care professional as soon as possible: -allergic reactions like skin rash, itching or hives, swelling of the face, lips, or tongue -any new or unusual symptoms -breathing problems -chest pain -leg pain or swelling -vomiting Side effects that usually do not require medical attention (report to your doctor or health care professional if they continue or are bothersome): -back or bone pain -cough, or throat infection -diarrhea or constipation -dizziness -headache -hot flashes -loss of appetite -nausea -sweating -weakness and tiredness -weight gain This list may not describe all possible side effects. Call your doctor for medical advice about side effects. You may report side effects to FDA at 1-800-FDA-1088. Where should I keep my medicine? Keep out of the reach of children. Store at room temperature between 20 and 25 degrees C (68 and 77 degrees F). Throw away any unused medicine after the expiration date. NOTE: This sheet is a summary. It may not cover all possible information. If you have questions about this medicine, talk to  your doctor, pharmacist, or health care provider.  2013, [employer] Standard. (10/29/2007 4:31:52 PM)

## 2012-11-11 ENCOUNTER — Encounter: Payer: Self-pay | Admitting: Radiation Oncology

## 2012-11-11 DIAGNOSIS — Z923 Personal history of irradiation: Secondary | ICD-10-CM | POA: Insufficient documentation

## 2012-11-11 DIAGNOSIS — C50919 Malignant neoplasm of unspecified site of unspecified female breast: Secondary | ICD-10-CM | POA: Insufficient documentation

## 2012-11-12 ENCOUNTER — Telehealth: Payer: Self-pay | Admitting: Oncology

## 2012-11-12 NOTE — Telephone Encounter (Signed)
S/w pt today re appt for bone density and also mammo that she cx'd. Pt was given appts for 3/28 @ 9am. Per pt she has something else she needs to do that day. Pt was given number for [facility] and she will call and r/s appts for another d/t more convenient for her.

## 2012-11-15 NOTE — Progress Notes (Signed)
OFFICE PROGRESS NOTE  CC  Allean Found, MD [address] [place] [address] Dr. Claud Kelp Dr. Lavell Luster Dr. Gunnar Bulla  DIAGNOSIS: 52 year old female with stage I invasive ductal carcinoma of the right breast status post central lumpectomy with sentinel lymph node biopsy on 07/15/2012.  PRIOR THERAPY:  #1 patient was seen originally by me on 06/30/2012 with new diagnosis of a 5 mm mass in the right breast at the 5:00 position. The biopsy showed invasive ductal carcinoma with associated DCIS tumor was ER +100% PR +60% Ki-67 6% and HER-2/neu negative. It was grade 1.  #2 patient has gone on to have a central lumpectomy on 07/15/2012 the final pathology revealed a 0.6 cm ER positive PR positive HER-2/neu negative low grade invasive ductal carcinoma. Sentinel node was negative for metastatic disease. Postoperatively she is doing well without any problems.  #3 patient is also scheduled to have left hip surgery performed on 08/17/2012. She is cleared from my perspective to proceed with this first.  #4 patient is status post radiation therapy to the right breast. She tolerated this well.  #5 proceed with adjuvant antiestrogen therapy consisting of Arimidex 1 mg daily. Total of 5 years of therapy is planned risks and benefits of this were discussed with the patient. She will take vitamin D calcium. We will also obtain a bone density scan.  CURRENT THERAPY: Arimidex 1 mg daily starting 11/04/2012.  INTERVAL HISTORY: IVET GUERRIERI 52 y.o. female isseen for followup. She has completed her radiation therapy without any problems. She denies any fevers chills night sweats headaches shortness of breath chest pains palpitations no myalgias and arthralgias. She is ambulatory since her hip replacement. Remainder of the 10 point review of systems is negative.   MEDICAL HISTORY: Past Medical History  Diagnosis Date  . Hyperlipidemia   . Osteoporosis   . Hypertension     Dr.  [facility]  . GERD (gastroesophageal reflux disease)     hx of   . Neuromuscular disorder     carpal tunnel right hand  . Chronic neck pain     hx of  . Hypothyroidism   . Heart attack 2001    Dr. Isabel Caprice  . Anginal pain 2001  . Pernicious anemia   . Sickle cell trait   . Migraines     "last one was 1990's" (08/17/2012)  . DJD (degenerative joint disease) of hip     bil hip  . Breast cancer     invasive ductal carcinoma right breast; ER, PR + and HER 2 -  . History of radiation therapy 09/16/12- 10/19/12    right breast    ALLERGIES:  is allergic to betadine and tramadol.  MEDICATIONS:  Current Outpatient Prescriptions  Medication Sig Dispense Refill  . alendronate (FOSAMAX) 70 MG tablet Take 70 mg by mouth every 7 (seven) days. Take with a full glass of water on an empty stomach. Saturday      . aspirin EC 325 MG EC tablet Take 1 tablet (325 mg total) by mouth 2 (two) times daily.  60 tablet  1  . atenolol (TENORMIN) 25 MG tablet Take 25 mg by mouth daily.      Marland Kitchen BIOTIN PO Take 2 tablets by mouth daily.      . CHANTIX 1 MG tablet       . Cholecalciferol (VITAMIN D PO) Take 1 tablet by mouth daily.      . Coenzyme Q10 (COQ10 PO) Take 1  tablet by mouth daily.      . cyanocobalamin (,VITAMIN B-12,) 1000 MCG/ML injection Inject 1,000 mcg into the muscle every 30 (thirty) days. As needed when feeling tired      . cyclobenzaprine (FLEXERIL) 10 MG tablet Take 10 mg by mouth 3 (three) times daily as needed. For muscle pain      . docusate sodium 100 MG CAPS Take 100 mg by mouth 2 (two) times daily.  30 capsule  1  . ezetimibe (ZETIA) 10 MG tablet Take 1 tablet (10 mg total) by mouth daily.  30 tablet  1  . GLUCOSAMINE-CHONDROITIN PO Take 1 tablet by mouth daily.      Marland Kitchen HYDROcodone-acetaminophen (NORCO/VICODIN) 5-325 MG per tablet Take 1-2 tablets by mouth every 4 (four) hours as needed (breakthrough pain).  80 tablet  0  . hydrOXYzine (ATARAX/VISTARIL) 25 MG  tablet       . IRON PO Take 1 tablet by mouth at bedtime as needed. When feeling tired      . levothyroxine (SYNTHROID, LEVOTHROID) 112 MCG tablet Take 125 mcg by mouth every morning.       Marland Kitchen lisinopril (PRINIVIL,ZESTRIL) 5 MG tablet Take 5 mg by mouth daily.      . mometasone (NASONEX) 50 MCG/ACT nasal spray Place 2 sprays into the nose daily as needed. For allergies      . Omega-3 Fatty Acids (FISH OIL PO) Take 1 capsule by mouth daily.      . penicillin v potassium (VEETID) 500 MG tablet Per pt to take before dental appt and after      . promethazine (PHENERGAN) 25 MG tablet Take 1 tablet (25 mg total) by mouth every 6 (six) hours as needed for nausea.  30 tablet  0  . rosuvastatin (CRESTOR) 20 MG tablet Take 20 mg by mouth daily.      Marland Kitchen triamcinolone (KENALOG) 0.1 % paste       . zolpidem (AMBIEN) 10 MG tablet       . anastrozole (ARIMIDEX) 1 MG tablet Take 1 tablet (1 mg total) by mouth daily.  90 tablet  12   No current facility-administered medications for this visit.    SURGICAL HISTORY:  Past Surgical History  Procedure Laterality Date  . Nasal sinus surgery  1990  . Coronary angioplasty with stent placement  2001    "1" (08/17/2012)  . Partial mastectomy with needle localization and axillary sentinel lymph node bx  07/15/2012    Procedure: PARTIAL MASTECTOMY WITH NEEDLE LOCALIZATION AND AXILLARY SENTINEL LYMPH NODE BX;  Surgeon: Ernestene Mention, MD;  Location: [facility] OR;  Service: General;  Laterality: Right;  . Total hip arthroplasty  08/17/2012    "right" (08/17/2012)  . Partial mastectomy with needle localization  07/2012    "right" (08/17/2012)  . Breast biopsy  06/2012    "right" (08/17/2012)  . Mastectomy    . Total hip arthroplasty  08/17/2012    Procedure: TOTAL HIP ARTHROPLASTY ANTERIOR APPROACH;  Surgeon: Velna Ochs, MD;  Location: [facility] OR;  Service: Orthopedics;  Laterality: Right;    REVIEW OF SYSTEMS:   General: fatigue (-), night sweats (-), fever (-),  pain (-) Lymph: palpable nodes (-) HEENT: vision changes (-), mucositis (-), gum bleeding (-), epistaxis (-) Cardiovascular: chest pain (-), palpitations (-) Pulmonary: shortness of breath (-), dyspnea on exertion (-), cough (-), hemoptysis (-) GI:  Early satiety (-), melena (-), dysphagia (-), nausea/vomiting (-), diarrhea (-) GU: dysuria (-), hematuria (-), incontinence (-)  Musculoskeletal: joint swelling (-), joint pain (-), back pain (-) Neuro: weakness (-), numbness (-), headache (-), confusion (-) Skin: Rash (-), lesions (-), dryness (-) Psych: depression (-), suicidal/homicidal ideation (-), feeling of hopelessness (-)   PHYSICAL EXAMINATION: Blood pressure 110/71, pulse 65, temperature 98.8 F (37.1 C), temperature source Oral, resp. rate 20, height 5\' 5"  (1.651 m), weight 142 lb 3.2 oz (64.501 kg). Body mass index is 23.66 kg/(m^2). General: Patient is a well appearing female in no acute distress HEENT: PERRLA, sclerae anicteric no conjunctival pallor, MMM Neck: supple, no palpable adenopathy Lungs: clear to auscultation bilaterally, no wheezes, rhonchi, or rales Cardiovascular: regular rate rhythm, S1, S2, no murmurs, rubs or gallops Abdomen: Soft, non-tender, non-distended, normoactive bowel sounds, no HSM Extremities: warm and well perfused, no clubbing, cyanosis, or edema Skin: No rashes or lesions Neuro: Non-focal Breasts: right breast central lumpectomy scar well healed, 1cm soft nodule in the central portion of the breast.  Left breast without masses, nodularity, or skin changes. Erythema and slight flaking of right chest wall.  ECOG PERFORMANCE STATUS: 1 - Symptomatic but completely ambulatory    LABORATORY DATA: Lab Results  Component Value Date   WBC 6.9 10/04/2012   HGB 12.0 10/04/2012   HCT 35.9 10/04/2012   MCV 85.0 10/04/2012   PLT 304 10/04/2012      Chemistry      Component Value Date/Time   NA 140 10/04/2012 0941   NA 141 08/18/2012 0545   K 3.8 10/04/2012  0941   K 4.1 08/18/2012 0545   CL 108* 10/04/2012 0941   CL 103 08/18/2012 0545   CO2 22 10/04/2012 0941   CO2 27 08/18/2012 0545   BUN 11.0 10/04/2012 0941   BUN 6 08/18/2012 0545   CREATININE 0.7 10/04/2012 0941   CREATININE 0.65 08/18/2012 0545      Component Value Date/Time   CALCIUM 9.6 10/04/2012 0941   CALCIUM 9.6 08/18/2012 0545   ALKPHOS 81 10/04/2012 0941   ALKPHOS 77 07/09/2012 1555   AST 15 10/04/2012 0941   AST 24 07/09/2012 1555   ALT 12 10/04/2012 0941   ALT 20 07/09/2012 1555   BILITOT 0.24 10/04/2012 0941   BILITOT 0.2* 07/09/2012 1555    Diagnosis 1. Lymph node, sentinel, biopsy, Right axillary - THERE IS NO EVIDENCE OF CARCINOMA IN 1 OF 1 LYMPH NODE (0/1). 2. Lymph node, sentinel, biopsy, Right axillary - THERE IS NO EVIDENCE OF CARCINOMA IN 1 OF 1 LYMPH NODE (0/1). 3. Breast, partial mastectomy, Right - INVASIVE DUCTAL CARCINOMA, GRADE I/III, SPANNING 0.6 CM. - THE SURGICAL RESECTION MARGINS ARE NEGATIVE FOR CARCINOMA. - SEE ONCOLOGY TABLE BELOW. Microscopic Comment 3. BREAST, INVASIVE TUMOR, WITH LYMPH NODE SAMPLING Specimen, including laterality: Right breast. Procedure: Mastectomy. Grade: I Tubule formation: 1 Nuclear pleomorphism: 2 Mitotic:1 Tumor size (gross measurement): 0.6 cm Margins: Negative for carcinoma Invasive, distance to closest margin: 2.0 cm to the posterior margin (gross measurement) Lymphovascular invasion: Not identified. Ductal carcinoma in situ: Not identified. Lobular neoplasia: Not identified. Tumor focality: Unifocal Treatment effect: N/A Extent of tumor: Confined to breast parenchyma. Lymph nodes: # examined: 2 Lymph nodes with metastasis: 0 Breast prognostic profile: [phone] 1 of 3 FINAL for BILAN, TEDESCO ([id number]) Microscopic Comment(continued) Estrogen receptor: 100%, strong staining intensity. Progesterone receptor: 60%, strong staining intensity. Her 2 neu: No amplification was detected. The ratio was 1.11. Her 2  neu by [facility] will be repeated on the current case and the results reported separately. Ki-67: 6%. Non-neoplastic  breast: Adenosis with calcifications, fibrocystic changes, and healing biopsy site. TNM: pT1b, pN0 (JBK:gt, 07/19/12) Pecola Leisure MD Pathologist, Electronic Signature (Case signed 07/19/2012) Specimen Gross and Clinical Information Specimen(s) Obtained: 1. Lymph node, sentinel, biopsy, Right axillary 2. Lymph node, sentinel, biopsy, Right axillary 3. Breast, partial mastectomy, Right Specimen Clinical Information   RADIOGRAPHIC STUDIES:  Chest 2 View  07/09/2012  *RADIOLOGY REPORT*  Clinical Data: Preoperative examination for right breast lumpectomy.  History of myocardial infarction.  Hypertension and smoking history.  CHEST - 2 VIEW  Comparison: 11/22/2006 to  Findings: The heart is mildly enlarged.  Mediastinal shadows are otherwise normal.  There are coronary artery stents.  The vascularity is normal.  Lungs are clear.  No effusions.  No bony abnormality.  IMPRESSION: Mild cardiomegaly.  Coronary artery stents.  No active disease evident.   Original Report Authenticated By: Paulina Fusi, M.D.    Nm Sentinel Node Inj-no Rpt (breast)  07/15/2012  CLINICAL DATA: Cancer right breast   Sulfur colloid was injected intradermally by the nuclear medicine  technologist for breast cancer sentinel node localization.     [place] Wire Localization Right  07/15/2012  *RADIOLOGY REPORT*  Clinical Data:  Recently diagnosed cancer at 5 o'clock in the right subareolar region.  RIGHT BREAST NEEDLE LOCALIZATION USING ULTRASOUND GUIDANCE AND SPECIMEN RADIOGRAPH  Patient presents for needle localization prior to surgical excision. The patient and I discussed the procedure of needle localization including benefits and alternatives. We discussed the high likelihood of a successful procedure. We discussed the risks of the procedure, including infection, bleeding, tissue injury, and further surgery.  Informed written consent was given.  Using ultrasound guidance, sterile technique, 2% lidocaine, and a 5 cm modified Kopans needle, the mass at 5 o'clock in the subareolar region localized using a mediolateral approach. Films were labeled and sent with the patient to surgery.  She tolerated procedure well.  Specimen radiograph is performed at [facility] operating room and confirms the mass, clip, and wire to be present in the tissue sample.  The specimen is marked for pathology.  IMPRESSION: Needle localization right breast.  No apparent complications.   Original Report Authenticated By: Cain Saupe, M.D.    Mm Breast Surgical Specimen  07/15/2012  *RADIOLOGY REPORT*  Clinical Data:  Recently diagnosed cancer at 5 o'clock in the right subareolar region.  RIGHT BREAST NEEDLE LOCALIZATION USING ULTRASOUND GUIDANCE AND SPECIMEN RADIOGRAPH  Patient presents for needle localization prior to surgical excision. The patient and I discussed the procedure of needle localization including benefits and alternatives. We discussed the high likelihood of a successful procedure. We discussed the risks of the procedure, including infection, bleeding, tissue injury, and further surgery. Informed written consent was given.  Using ultrasound guidance, sterile technique, 2% lidocaine, and a 5 cm modified Kopans needle, the mass at 5 o'clock in the subareolar region localized using a mediolateral approach. Films were labeled and sent with the patient to surgery.  She tolerated procedure well.  Specimen radiograph is performed at [facility] operating room and confirms the mass, clip, and wire to be present in the tissue sample.  The specimen is marked for pathology.  IMPRESSION: Needle localization right breast.  No apparent complications.   Original Report Authenticated By: Cain Saupe, M.D.     ASSESSMENT: 52 year old female with  #1 stage I invasive ductal carcinoma of the right breast ER positive PR  positive HER-2/neu negative with a low Ki-67 grade 1. Status post central lumpectomy with sentinel lymph  node biopsy. Lymph nodes were negative for metastatic disease. Postoperatively patient is doing well.  #2 patient with significant arthritis s/p hip replacement in December.  ##3 patient has completed radiation therapy to the breast. At this time she will proceed with adjuvant antiestrogen therapy consisting of her remote ask 1 mg daily. Risks and benefits of this were discussed with the patient as well as the rationale and possible complications. Literature has been given to her as well.  #4 she will need a bone density scans and I have gone ahead and ordered this for her.   PLAN:   #1proceed with Arimidex 1 mg daily  #2 she'll be seen back in  3 months time for followup.  #3 she will get a bone density scan set up as well take calcium and vitamin D  All questions were answered. The patient knows to call the clinic with any problems, questions or concerns. We can certainly see the patient much sooner if necessary.  I spent 25 minutes counseling the patient face to face. The total time spent in the appointment was 30 minutes.  Drue Second, MD Medical/Oncology [facility] [phone] (beeper) [phone] (Office)

## 2012-11-18 ENCOUNTER — Ambulatory Visit
Admission: RE | Admit: 2012-11-18 | Discharge: 2012-11-18 | Disposition: A | Discharge status: Home / Self Care | Payer: BC Managed Care – PPO | Source: Ambulatory Visit | Attending: Radiation Oncology | Admitting: Radiation Oncology

## 2012-11-18 ENCOUNTER — Encounter: Payer: Self-pay | Admitting: Radiation Oncology

## 2012-11-18 VITALS — BP 119/74 | HR 76 | Temp 98.7°F | Resp 20 | Wt 143.7 lb

## 2012-11-18 DIAGNOSIS — C50311 Malignant neoplasm of lower-inner quadrant of right female breast: Secondary | ICD-10-CM

## 2012-11-18 NOTE — Progress Notes (Signed)
Department of Radiation Oncology  Phone:  [phone] Fax:        [phone]   Name: Amber [name] MRN: [id number]  DOB: [birth year]  Date: 11/18/2012  Follow Up Visit Note  Diagnosis: T1 B. N0 right breast cancer Summary and Interval since last radiation:  52.72 gray completed 10/19/2012  Interval History: Amber [name] presents today for routine followup.  She had moist desquamation in the inframammary fold after treatment. She is still trying to work on cutting down her cigarette use. She is tolerating a RAM effects without any side effects or issues.  Allergies:  Allergies  Allergen Reactions  . Betadine (Povidone Iodine) Rash  . Tramadol Nausea And Vomiting and Other (See Comments)    "couldn't stop vomiting" (08/17/2012)    Medications:  Current Outpatient Prescriptions  Medication Sig Dispense Refill  . alendronate (FOSAMAX) 70 MG tablet Take 70 mg by mouth every 7 (seven) days. Take with a full glass of water on an empty stomach. Saturday      . anastrozole (ARIMIDEX) 1 MG tablet Take 1 tablet (1 mg total) by mouth daily.  90 tablet  12  . aspirin EC 325 MG EC tablet Take 1 tablet (325 mg total) by mouth 2 (two) times daily.  60 tablet  1  . atenolol (TENORMIN) 25 MG tablet Take 25 mg by mouth daily.      Marland Kitchen BIOTIN PO Take 2 tablets by mouth daily.      . CHANTIX 1 MG tablet       . Cholecalciferol (VITAMIN D PO) Take 1 tablet by mouth daily.      . Coenzyme Q10 (COQ10 PO) Take 1 tablet by mouth daily.      . cyanocobalamin (,VITAMIN B-12,) 1000 MCG/ML injection Inject 1,000 mcg into the muscle every 30 (thirty) days. As needed when feeling tired      . cyclobenzaprine (FLEXERIL) 10 MG tablet Take 10 mg by mouth 3 (three) times daily as needed. For muscle pain      . docusate sodium 100 MG CAPS Take 100 mg by mouth 2 (two) times daily.  30 capsule  1  . ezetimibe (ZETIA) 10 MG tablet Take 1 tablet (10 mg total) by mouth daily.  30 tablet  1  . GLUCOSAMINE-CHONDROITIN  PO Take 1 tablet by mouth daily.      Marland Kitchen HYDROcodone-acetaminophen (NORCO/VICODIN) 5-325 MG per tablet Take 1-2 tablets by mouth every 4 (four) hours as needed (breakthrough pain).  80 tablet  0  . hydrOXYzine (ATARAX/VISTARIL) 25 MG tablet       . IRON PO Take 1 tablet by mouth at bedtime as needed. When feeling tired      . levothyroxine (SYNTHROID, LEVOTHROID) 112 MCG tablet Take 125 mcg by mouth every morning.       Marland Kitchen lisinopril (PRINIVIL,ZESTRIL) 5 MG tablet Take 5 mg by mouth daily.      . mometasone (NASONEX) 50 MCG/ACT nasal spray Place 2 sprays into the nose daily as needed. For allergies      . Omega-3 Fatty Acids (FISH OIL PO) Take 1 capsule by mouth daily.      . penicillin v potassium (VEETID) 500 MG tablet Per pt to take before dental appt and after      . promethazine (PHENERGAN) 25 MG tablet Take 1 tablet (25 mg total) by mouth every 6 (six) hours as needed for nausea.  30 tablet  0  . rosuvastatin (CRESTOR) 20 MG tablet Take 20 mg  by mouth daily.      Marland Kitchen triamcinolone (KENALOG) 0.1 % paste       . zolpidem (AMBIEN) 10 MG tablet        No current facility-administered medications for this encounter.    Physical Exam:  Filed Vitals:   11/18/12 1548  BP: 119/74  Pulse: 76  Temp: 98.7 F (37.1 C)  Resp: 20   she has darkened skin over her right breast. She has areas of hypopigmentation in the upper outer quadrant in the inframammary fold.  IMPRESSION: Amber [name] is a 52 y.o. female status post breast conservation with resolving acute effects of treatment   PLAN:  we discussed using lotion with vitamin E. We discussed smoking cessation again and I encouraged her to continue working toward that goal. She is on a RAM effects and has an appointment with medical oncology in June. I released her from followup with me. I be happy to see her back at any point in the future.     Lurline Hare, MD

## 2012-11-18 NOTE — Progress Notes (Signed)
Pt denies pain, states she had skin irritation of right breast x 2 weeks following completion of radiation tx. She states her skin in right breast tx area "still healing, flaking but dry now". She is applying lotion w/vit E. Taking Arimidex daily.

## 2012-11-26 ENCOUNTER — Other Ambulatory Visit: Payer: BC Managed Care – PPO

## 2012-11-30 ENCOUNTER — Ambulatory Visit
Admission: RE | Admit: 2012-11-30 | Discharge: 2012-11-30 | Disposition: A | Discharge status: Home / Self Care | Payer: BC Managed Care – PPO | Source: Ambulatory Visit | Attending: Oncology | Admitting: Oncology

## 2012-11-30 DIAGNOSIS — C50319 Malignant neoplasm of lower-inner quadrant of unspecified female breast: Secondary | ICD-10-CM

## 2012-12-21 ENCOUNTER — Encounter: Payer: Self-pay | Admitting: Medical Oncology

## 2012-12-21 ENCOUNTER — Telehealth: Payer: Self-pay | Admitting: Medical Oncology

## 2012-12-21 NOTE — Telephone Encounter (Signed)
Patient called requesting letter for ok to have facial/massage/pedicure from Dr Welton Flakes, stated she is going to a spa next month and was concerned they will ask for an ok from MD.  Per MD ok. Letter to be mailed to patient. Patient expressed thank you knows to call with any questions or concerns.

## 2013-01-13 ENCOUNTER — Encounter (INDEPENDENT_AMBULATORY_CARE_PROVIDER_SITE_OTHER): Payer: Self-pay | Admitting: General Surgery

## 2013-01-13 ENCOUNTER — Ambulatory Visit (INDEPENDENT_AMBULATORY_CARE_PROVIDER_SITE_OTHER): Payer: BC Managed Care – PPO | Admitting: General Surgery

## 2013-01-13 VITALS — BP 110/78 | HR 60 | Temp 98.5°F | Resp 12 | Ht 64.0 in | Wt 140.4 lb

## 2013-01-13 DIAGNOSIS — C50311 Malignant neoplasm of lower-inner quadrant of right female breast: Secondary | ICD-10-CM

## 2013-01-13 DIAGNOSIS — C50319 Malignant neoplasm of lower-inner quadrant of unspecified female breast: Secondary | ICD-10-CM

## 2013-01-13 NOTE — Progress Notes (Signed)
Patient ID: Amber [name], female   DOB: February 12, 1961, 52 y.o.   MRN: [id number] History: This patient returns for followup regarding her right breast cancer. On 07/15/2012 she underwent a right central lumpectomy and sentinel node biopsy. She had a receptor positive, HER-2-negative, stage TI B. N0 tumor with negative margins. She has completed radiation therapy in February and her skin has returned to normal. She has had her hip replacement and has done well with that. She is on anastrozole. She has no complaints about her breast  Exam: Patient looks well. No distress Neck: No adenopathy or mass Lungs: Auscultation bilaterally Heart: Regular rate and rhythm. No murmur, no ectopy Breast right breast reveals transverse incision, surgical absence of nipple areola, tissue soft. The skin healthy. No mass. No axillary adenopathy  Assessment: Invasive carcinoma right breast, pathologic stage T1b., N0, receptor positive, HER-2-negative. Doing well following breast conservation surgery, adjuvant radiation therapy and adjuvant antiestrogen therapy. No evidence of recurrence  Plan: Continue anastrozole and regular followup with medical oncology Bilateral mammograms in October or November See me in December of this year.   Angelia Mould. Derrell Lolling, M.D., [facility] General and Minimally invasive Surgery Breast and Colorectal Surgery Office:   [phone] Pager:   [phone]

## 2013-01-13 NOTE — Patient Instructions (Signed)
Examination of your right breast and all of the lymph node areas today is normal. There is no evidence of cancer.  Continue to take the and anastrozole and keep your regular appointments with Dr. Drue Second.  You should get bilateral mammograms in October or November.  Return to see Dr. Derrell Lolling in December.

## 2013-02-07 ENCOUNTER — Telehealth: Payer: Self-pay | Admitting: Oncology

## 2013-02-09 ENCOUNTER — Ambulatory Visit: Payer: BC Managed Care – PPO | Admitting: Oncology

## 2013-02-09 ENCOUNTER — Other Ambulatory Visit: Payer: BC Managed Care – PPO | Admitting: Lab

## 2013-02-16 ENCOUNTER — Other Ambulatory Visit (HOSPITAL_BASED_OUTPATIENT_CLINIC_OR_DEPARTMENT_OTHER): Payer: BC Managed Care – PPO | Admitting: Lab

## 2013-02-16 ENCOUNTER — Telehealth: Payer: Self-pay | Admitting: *Deleted

## 2013-02-16 ENCOUNTER — Encounter: Payer: Self-pay | Admitting: Oncology

## 2013-02-16 ENCOUNTER — Other Ambulatory Visit: Payer: Self-pay | Admitting: Emergency Medicine

## 2013-02-16 ENCOUNTER — Ambulatory Visit (HOSPITAL_BASED_OUTPATIENT_CLINIC_OR_DEPARTMENT_OTHER): Payer: BC Managed Care – PPO | Admitting: Oncology

## 2013-02-16 VITALS — BP 114/69 | HR 73 | Temp 98.5°F | Resp 20 | Ht 64.0 in | Wt 142.2 lb

## 2013-02-16 DIAGNOSIS — C50311 Malignant neoplasm of lower-inner quadrant of right female breast: Secondary | ICD-10-CM

## 2013-02-16 DIAGNOSIS — C50319 Malignant neoplasm of lower-inner quadrant of unspecified female breast: Secondary | ICD-10-CM

## 2013-02-16 LAB — COMPREHENSIVE METABOLIC PANEL (CC13)
Albumin: 3.6 g/dL (ref 3.5–5.0)
BUN: 10.5 mg/dL (ref 7.0–26.0)
CO2: 24 mEq/L (ref 22–29)
Glucose: 126 mg/dl — ABNORMAL HIGH (ref 70–99)
Potassium: 4.2 mEq/L (ref 3.5–5.1)
Sodium: 140 mEq/L (ref 136–145)
Total Bilirubin: 0.33 mg/dL (ref 0.20–1.20)
Total Protein: 7.2 g/dL (ref 6.4–8.3)

## 2013-02-16 LAB — CBC WITH DIFFERENTIAL/PLATELET
Basophils Absolute: 0.1 10*3/uL (ref 0.0–0.1)
Eosinophils Absolute: 0.4 10*3/uL (ref 0.0–0.5)
HGB: 11.9 g/dL (ref 11.6–15.9)
LYMPH%: 17 % (ref 14.0–49.7)
MCV: 80.2 fL (ref 79.5–101.0)
MONO#: 0.6 10*3/uL (ref 0.1–0.9)
NEUT#: 5.6 10*3/uL (ref 1.5–6.5)
Platelets: 239 10*3/uL (ref 145–400)
RBC: 4.36 10*6/uL (ref 3.70–5.45)
WBC: 8 10*3/uL (ref 3.9–10.3)

## 2013-02-16 NOTE — Progress Notes (Signed)
OFFICE PROGRESS NOTE  CC  Amber Found, MD [address] [place] [address] Dr. Claud Kelp Dr. Lavell Luster Dr. Gunnar Bulla  DIAGNOSIS: 52 year old female with stage I invasive ductal carcinoma of the right breast status post central lumpectomy with sentinel lymph node biopsy on 07/15/2012.  PRIOR THERAPY:  #1 patient was seen originally by me on 06/30/2012 with new diagnosis of a 5 mm mass in the right breast at the 5:00 position. The biopsy showed invasive ductal carcinoma with associated DCIS tumor was ER +100% PR +60% Ki-67 6% and HER-2/neu negative. It was grade 1.  #2 patient has gone on to have a central lumpectomy on 07/15/2012 the final pathology revealed a 0.6 cm ER positive PR positive HER-2/neu negative low grade invasive ductal carcinoma. Sentinel node was negative for metastatic disease. Postoperatively she is doing well without any problems.  #3 patient is also scheduled to have left hip surgery performed on 08/17/2012. She is cleared from my perspective to proceed with this first.  #4 patient is status post radiation therapy to the right breast. She tolerated this well.  #5 proceed with adjuvant antiestrogen therapy consisting of Arimidex 1 mg daily. Total of 5 years of therapy is planned risks and benefits of this were discussed with the patient. She will take vitamin D calcium. We will also obtain a bone density scan.  CURRENT THERAPY: Arimidex 1 mg daily starting 11/04/2012.  INTERVAL HISTORY: Amber [name] 52 y.o. female isseen for followup.. She denies any fevers chills night sweats headaches shortness of breath chest pains palpitations no myalgias and arthralgias. She is ambulatory since her hip replacement. Remainder of the 10 point review of systems is negative.   MEDICAL HISTORY: Past Medical History  Diagnosis Date  . Hyperlipidemia   . Osteoporosis   . Hypertension     Dr. [facility]  . GERD (gastroesophageal  reflux disease)     hx of   . Neuromuscular disorder     carpal tunnel right hand  . Chronic neck pain     hx of  . Hypothyroidism   . Heart attack 2001    Dr. Isabel Caprice  . Anginal pain 2001  . Pernicious anemia   . Sickle cell trait   . Migraines     "last one was 1990's" (08/17/2012)  . DJD (degenerative joint disease) of hip     bil hip  . Breast cancer     invasive ductal carcinoma right breast; ER, PR + and HER 2 -  . History of radiation therapy 09/16/12- 10/19/12    right breast    ALLERGIES:  is allergic to betadine and tramadol.  MEDICATIONS:  Current Outpatient Prescriptions  Medication Sig Dispense Refill  . alendronate (FOSAMAX) 70 MG tablet Take 70 mg by mouth every 7 (seven) days. Take with a full glass of water on an empty stomach. Saturday      . aspirin EC 325 MG EC tablet Take 1 tablet (325 mg total) by mouth 2 (two) times daily.  60 tablet  1  . atenolol (TENORMIN) 25 MG tablet Take 25 mg by mouth daily.      Marland Kitchen BIOTIN PO Take 2 tablets by mouth daily.      . CHANTIX 1 MG tablet       . Cholecalciferol (VITAMIN D PO) Take 1 tablet by mouth daily.      . Coenzyme Q10 (COQ10 PO) Take 1 tablet by mouth daily.      Marland Kitchen  cyanocobalamin (,VITAMIN B-12,) 1000 MCG/ML injection Inject 1,000 mcg into the muscle every 30 (thirty) days. As needed when feeling tired      . cyclobenzaprine (FLEXERIL) 10 MG tablet Take 10 mg by mouth 3 (three) times daily as needed. For muscle pain      . docusate sodium 100 MG CAPS Take 100 mg by mouth 2 (two) times daily.  30 capsule  1  . ezetimibe (ZETIA) 10 MG tablet Take 1 tablet (10 mg total) by mouth daily.  30 tablet  1  . GLUCOSAMINE-CHONDROITIN PO Take 1 tablet by mouth daily.      . hydrOXYzine (ATARAX/VISTARIL) 25 MG tablet       . IRON PO Take 1 tablet by mouth at bedtime as needed. When feeling tired      . levothyroxine (SYNTHROID, LEVOTHROID) 112 MCG tablet Take 125 mcg by mouth every morning.       Marland Kitchen lisinopril  (PRINIVIL,ZESTRIL) 5 MG tablet Take 5 mg by mouth daily.      . mometasone (NASONEX) 50 MCG/ACT nasal spray Place 2 sprays into the nose daily as needed. For allergies      . Omega-3 Fatty Acids (FISH OIL PO) Take 1 capsule by mouth daily.      . rosuvastatin (CRESTOR) 20 MG tablet Take 20 mg by mouth daily.      Marland Kitchen zolpidem (AMBIEN) 10 MG tablet       . HYDROcodone-acetaminophen (NORCO/VICODIN) 5-325 MG per tablet Take 1-2 tablets by mouth every 4 (four) hours as needed (breakthrough pain).  80 tablet  0  . penicillin v potassium (VEETID) 500 MG tablet Per pt to take before dental appt and after      . promethazine (PHENERGAN) 25 MG tablet Take 1 tablet (25 mg total) by mouth every 6 (six) hours as needed for nausea.  30 tablet  0  . triamcinolone (KENALOG) 0.1 % paste        No current facility-administered medications for this visit.    SURGICAL HISTORY:  Past Surgical History  Procedure Laterality Date  . Nasal sinus surgery  1990  . Coronary angioplasty with stent placement  2001    "1" (08/17/2012)  . Partial mastectomy with needle localization and axillary sentinel lymph node bx  07/15/2012    Procedure: PARTIAL MASTECTOMY WITH NEEDLE LOCALIZATION AND AXILLARY SENTINEL LYMPH NODE BX;  Surgeon: Ernestene Mention, MD;  Location: [facility] OR;  Service: General;  Laterality: Right;  . Total hip arthroplasty  08/17/2012    "right" (08/17/2012)  . Partial mastectomy with needle localization  07/2012    "right" (08/17/2012)  . Breast biopsy  06/2012    "right" (08/17/2012)  . Mastectomy    . Total hip arthroplasty  08/17/2012    Procedure: TOTAL HIP ARTHROPLASTY ANTERIOR APPROACH;  Surgeon: Velna Ochs, MD;  Location: [facility] OR;  Service: Orthopedics;  Laterality: Right;    REVIEW OF SYSTEMS:   General: fatigue (-), night sweats (-), fever (-), pain (-) Lymph: palpable nodes (-) HEENT: vision changes (-), mucositis (-), gum bleeding (-), epistaxis (-) Cardiovascular: chest pain (-),  palpitations (-) Pulmonary: shortness of breath (-), dyspnea on exertion (-), cough (-), hemoptysis (-) GI:  Early satiety (-), melena (-), dysphagia (-), nausea/vomiting (-), diarrhea (-) GU: dysuria (-), hematuria (-), incontinence (-) Musculoskeletal: joint swelling (-), joint pain (-), back pain (-) Neuro: weakness (-), numbness (-), headache (-), confusion (-) Skin: Rash (-), lesions (-), dryness (-) Psych: depression (-), suicidal/homicidal  ideation (-), feeling of hopelessness (-)   PHYSICAL EXAMINATION: Blood pressure 114/69, pulse 73, temperature 98.5 F (36.9 C), temperature source Oral, resp. rate 20, height 5\' 4"  (1.626 m), weight 142 lb 3.2 oz (64.501 kg). Body mass index is 24.4 kg/(m^2). General: Patient is a well appearing female in no acute distress HEENT: PERRLA, sclerae anicteric no conjunctival pallor, MMM Neck: supple, no palpable adenopathy Lungs: clear to auscultation bilaterally, no wheezes, rhonchi, or rales Cardiovascular: regular rate rhythm, S1, S2, no murmurs, rubs or gallops Abdomen: Soft, non-tender, non-distended, normoactive bowel sounds, no HSM Extremities: warm and well perfused, no clubbing, cyanosis, or edema Skin: No rashes or lesions Neuro: Non-focal Breasts: right breast central lumpectomy scar well healed, 1cm soft nodule in the central portion of the breast.  Left breast without masses, nodularity, or skin changes. Erythema and slight flaking of right chest wall.  ECOG PERFORMANCE STATUS: 1 - Symptomatic but completely ambulatory    LABORATORY DATA: Lab Results  Component Value Date   WBC 8.0 02/16/2013   HGB 11.9 02/16/2013   HCT 35.0 02/16/2013   MCV 80.2 02/16/2013   PLT 239 02/16/2013      Chemistry      Component Value Date/Time   NA 140 10/04/2012 0941   NA 141 08/18/2012 0545   K 3.8 10/04/2012 0941   K 4.1 08/18/2012 0545   CL 108* 10/04/2012 0941   CL 103 08/18/2012 0545   CO2 22 10/04/2012 0941   CO2 27 08/18/2012 0545   BUN 11.0  10/04/2012 0941   BUN 6 08/18/2012 0545   CREATININE 0.7 10/04/2012 0941   CREATININE 0.65 08/18/2012 0545      Component Value Date/Time   CALCIUM 9.6 10/04/2012 0941   CALCIUM 9.6 08/18/2012 0545   ALKPHOS 81 10/04/2012 0941   ALKPHOS 77 07/09/2012 1555   AST 15 10/04/2012 0941   AST 24 07/09/2012 1555   ALT 12 10/04/2012 0941   ALT 20 07/09/2012 1555   BILITOT 0.24 10/04/2012 0941   BILITOT 0.2* 07/09/2012 1555    Diagnosis 1. Lymph node, sentinel, biopsy, Right axillary - THERE IS NO EVIDENCE OF CARCINOMA IN 1 OF 1 LYMPH NODE (0/1). 2. Lymph node, sentinel, biopsy, Right axillary - THERE IS NO EVIDENCE OF CARCINOMA IN 1 OF 1 LYMPH NODE (0/1). 3. Breast, partial mastectomy, Right - INVASIVE DUCTAL CARCINOMA, GRADE I/III, SPANNING 0.6 CM. - THE SURGICAL RESECTION MARGINS ARE NEGATIVE FOR CARCINOMA. - SEE ONCOLOGY TABLE BELOW. Microscopic Comment 3. BREAST, INVASIVE TUMOR, WITH LYMPH NODE SAMPLING Specimen, including laterality: Right breast. Procedure: Mastectomy. Grade: I Tubule formation: 1 Nuclear pleomorphism: 2 Mitotic:1 Tumor size (gross measurement): 0.6 cm Margins: Negative for carcinoma Invasive, distance to closest margin: 2.0 cm to the posterior margin (gross measurement) Lymphovascular invasion: Not identified. Ductal carcinoma in situ: Not identified. Lobular neoplasia: Not identified. Tumor focality: Unifocal Treatment effect: N/A Extent of tumor: Confined to breast parenchyma. Lymph nodes: # examined: 2 Lymph nodes with metastasis: 0 Breast prognostic profile: [phone] 1 of 3 FINAL for ANNISTEN, [place] ([id number]) Microscopic Comment(continued) Estrogen receptor: 100%, strong staining intensity. Progesterone receptor: 60%, strong staining intensity. Her 2 neu: No amplification was detected. The ratio was 1.11. Her 2 neu by [facility] will be repeated on the current case and the results reported separately. Ki-67: 6%. Non-neoplastic breast: Adenosis with  calcifications, fibrocystic changes, and healing biopsy site. TNM: pT1b, pN0 (JBK:gt, 07/19/12) Pecola Leisure MD Pathologist, Electronic Signature (Case signed 07/19/2012) Specimen Gross and Clinical Information Specimen(s)  Obtained: 1. Lymph node, sentinel, biopsy, Right axillary 2. Lymph node, sentinel, biopsy, Right axillary 3. Breast, partial mastectomy, Right Specimen Clinical Information   RADIOGRAPHIC STUDIES:  Chest 2 View  07/09/2012  *RADIOLOGY REPORT*  Clinical Data: Preoperative examination for right breast lumpectomy.  History of myocardial infarction.  Hypertension and smoking history.  CHEST - 2 VIEW  Comparison: 11/22/2006 to  Findings: The heart is mildly enlarged.  Mediastinal shadows are otherwise normal.  There are coronary artery stents.  The vascularity is normal.  Lungs are clear.  No effusions.  No bony abnormality.  IMPRESSION: Mild cardiomegaly.  Coronary artery stents.  No active disease evident.   Original Report Authenticated By: Paulina Fusi, M.D.    Nm Sentinel Node Inj-no Rpt (breast)  07/15/2012  CLINICAL DATA: Cancer right breast   Sulfur colloid was injected intradermally by the nuclear medicine  technologist for breast cancer sentinel node localization.     [place] Wire Localization Right  07/15/2012  *RADIOLOGY REPORT*  Clinical Data:  Recently diagnosed cancer at 5 o'clock in the right subareolar region.  RIGHT BREAST NEEDLE LOCALIZATION USING ULTRASOUND GUIDANCE AND SPECIMEN RADIOGRAPH  Patient presents for needle localization prior to surgical excision. The patient and I discussed the procedure of needle localization including benefits and alternatives. We discussed the high likelihood of a successful procedure. We discussed the risks of the procedure, including infection, bleeding, tissue injury, and further surgery. Informed written consent was given.  Using ultrasound guidance, sterile technique, 2% lidocaine, and a 5 cm modified Kopans needle, the mass at  5 o'clock in the subareolar region localized using a mediolateral approach. Films were labeled and sent with the patient to surgery.  She tolerated procedure well.  Specimen radiograph is performed at [facility] operating room and confirms the mass, clip, and wire to be present in the tissue sample.  The specimen is marked for pathology.  IMPRESSION: Needle localization right breast.  No apparent complications.   Original Report Authenticated By: Cain Saupe, M.D.    Mm Breast Surgical Specimen  07/15/2012  *RADIOLOGY REPORT*  Clinical Data:  Recently diagnosed cancer at 5 o'clock in the right subareolar region.  RIGHT BREAST NEEDLE LOCALIZATION USING ULTRASOUND GUIDANCE AND SPECIMEN RADIOGRAPH  Patient presents for needle localization prior to surgical excision. The patient and I discussed the procedure of needle localization including benefits and alternatives. We discussed the high likelihood of a successful procedure. We discussed the risks of the procedure, including infection, bleeding, tissue injury, and further surgery. Informed written consent was given.  Using ultrasound guidance, sterile technique, 2% lidocaine, and a 5 cm modified Kopans needle, the mass at 5 o'clock in the subareolar region localized using a mediolateral approach. Films were labeled and sent with the patient to surgery.  She tolerated procedure well.  Specimen radiograph is performed at [facility] operating room and confirms the mass, clip, and wire to be present in the tissue sample.  The specimen is marked for pathology.  IMPRESSION: Needle localization right breast.  No apparent complications.   Original Report Authenticated By: Cain Saupe, M.D.     ASSESSMENT: 52 year old female with  #1 stage I invasive ductal carcinoma of the right breast ER positive PR positive HER-2/neu negative with a low Ki-67 grade 1. Status post central lumpectomy with sentinel lymph node biopsy. Lymph nodes were negative  for metastatic disease.   #2 patient with significant arthritis s/p hip replacement in December.  ##3 patient has completed radiation therapy to the  breast.    #4 she  proceeded with adjuvant antiestrogen therapy consisting of her remote ask 1 mg daily. Risks and benefits of this were discussed with the patient as well as the rationale and possible complications. Literature has been given to her as well.  #5 low bone mass on bone density on Fosamax   PLAN:   #1 patient will continue Arimidex 1 mg daily.  #2 her bone density scan did show osteopenia/low bone mass. She is on Fosamax 70 mg q. weekly and she will continue this.  All questions were answered. The patient knows to call the clinic with any problems, questions or concerns. We can certainly see the patient much sooner if necessary.  I spent 25 minutes counseling the patient face to face. The total time spent in the appointment was 30 minutes.  Drue Second, MD Medical/Oncology [facility] [place] [phone] (beeper) [phone] (Office)

## 2013-02-16 NOTE — Telephone Encounter (Signed)
appts made and printed...td 

## 2013-02-16 NOTE — Patient Instructions (Addendum)
Doing well  Continue rimidex 1 mg daily  I will see you back in 6 months  Please call with any problems

## 2013-06-24 ENCOUNTER — Ambulatory Visit
Admission: RE | Admit: 2013-06-24 | Discharge: 2013-06-24 | Disposition: A | Discharge status: Home / Self Care | Payer: BC Managed Care – PPO | Source: Ambulatory Visit | Attending: Adult Health | Admitting: Adult Health

## 2013-06-24 DIAGNOSIS — C50319 Malignant neoplasm of lower-inner quadrant of unspecified female breast: Secondary | ICD-10-CM

## 2013-07-01 ENCOUNTER — Other Ambulatory Visit: Payer: Self-pay | Admitting: Adult Health

## 2013-07-07 ENCOUNTER — Other Ambulatory Visit: Payer: Self-pay

## 2013-07-26 IMAGING — CR DG CHEST 2V
2 series · 2 of 2 positions shown · non-contrast
Comparison: 11/22/2006 to

CLINICAL DATA: Preoperative examination for right breast
lumpectomy.  History of myocardial infarction.  Hypertension and
smoking history.

CHEST - 2 VIEW

[view not recorded (1 of 2)]
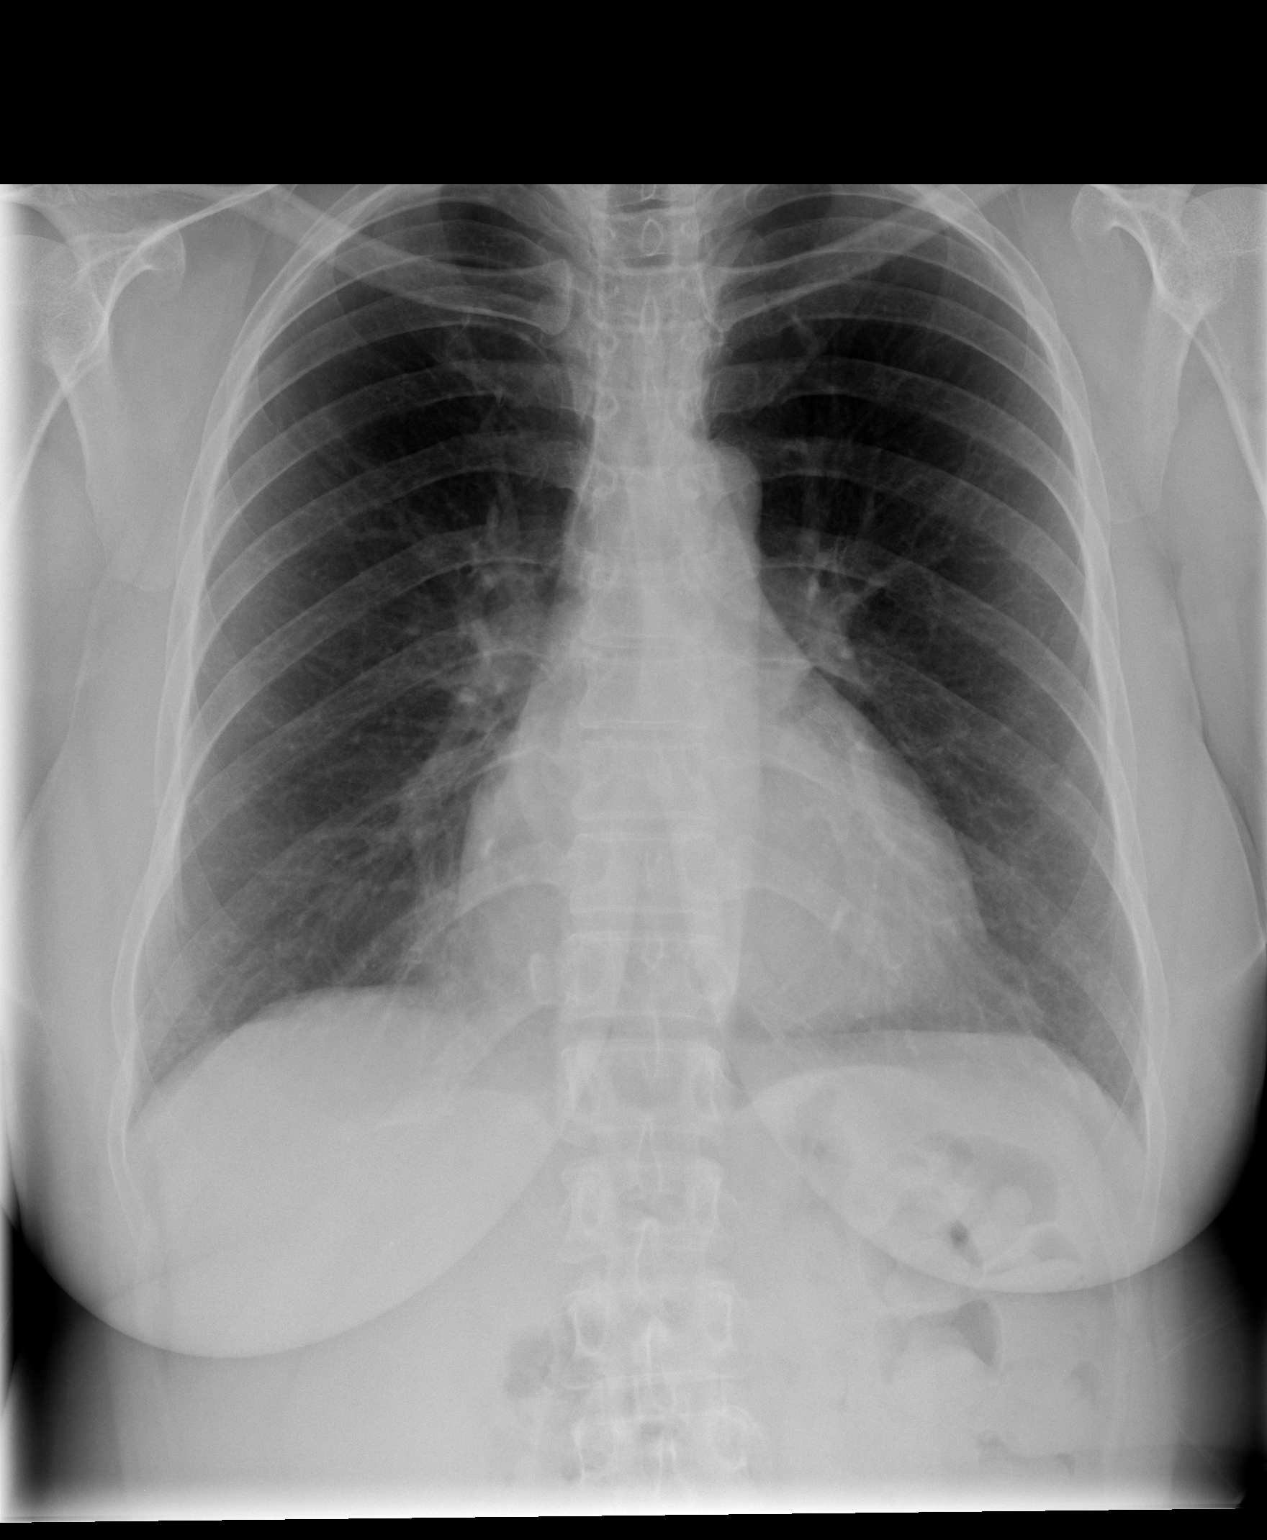

[view not recorded (2 of 2)]
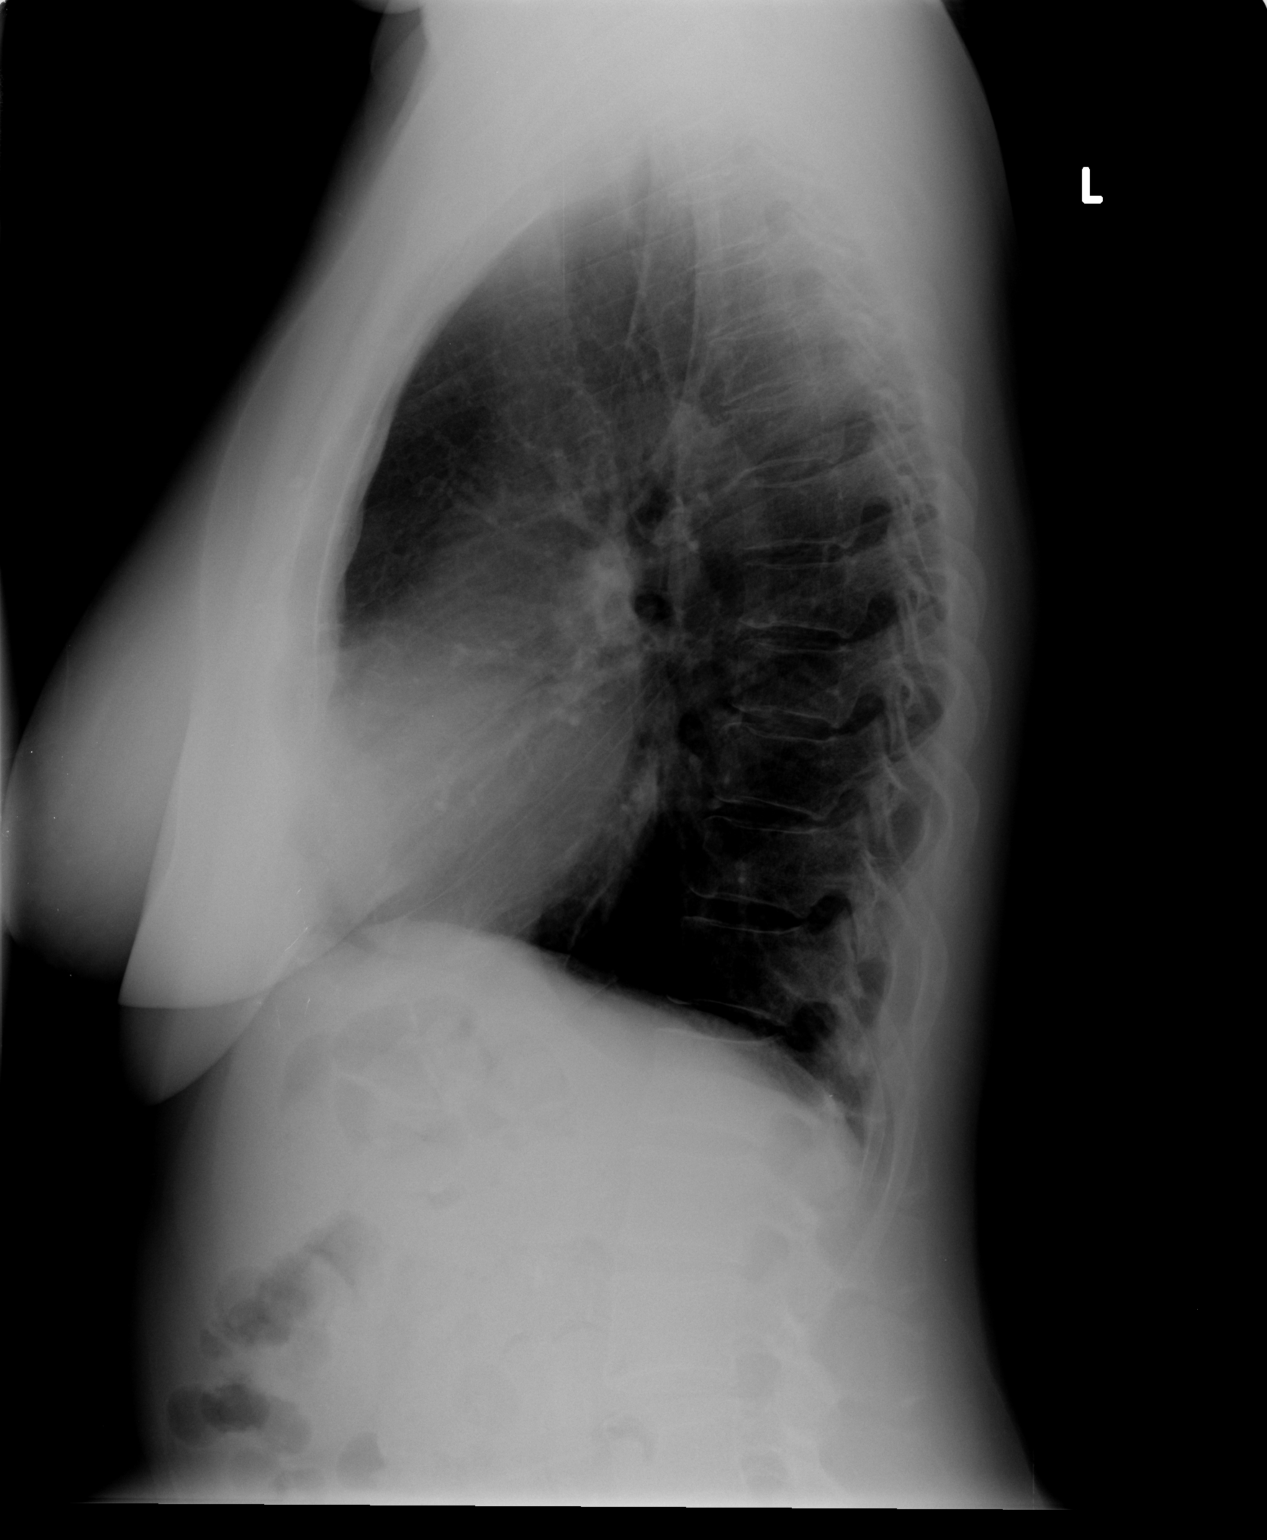

[2 of 2 positions shown; findings below may reference images not displayed]

FINDINGS: The heart is mildly enlarged.  Mediastinal shadows are
otherwise normal.  There are coronary artery stents.  The
vascularity is normal.  Lungs are clear.  No effusions.  No bony
abnormality.
IMPRESSION: Mild cardiomegaly.  Coronary artery stents.  No active disease
evident.

## 2013-08-10 ENCOUNTER — Encounter (INDEPENDENT_AMBULATORY_CARE_PROVIDER_SITE_OTHER): Payer: Self-pay | Admitting: General Surgery

## 2013-08-10 ENCOUNTER — Ambulatory Visit (INDEPENDENT_AMBULATORY_CARE_PROVIDER_SITE_OTHER): Payer: BC Managed Care – PPO | Admitting: General Surgery

## 2013-08-10 VITALS — BP 110/70 | HR 72 | Resp 18 | Ht 65.0 in | Wt 145.0 lb

## 2013-08-10 DIAGNOSIS — C50311 Malignant neoplasm of lower-inner quadrant of right female breast: Secondary | ICD-10-CM

## 2013-08-10 DIAGNOSIS — C50319 Malignant neoplasm of lower-inner quadrant of unspecified female breast: Secondary | ICD-10-CM

## 2013-08-10 NOTE — Progress Notes (Signed)
Patient ID: Amber [name], female   DOB: [birth year], 52 y.o.   MRN: [id number]  History:  This patient returns for followup regarding her right breast cancer.  On 07/15/2012 she underwent a right central lumpectomy and sentinel node biopsy. She had a receptor positive, HER-2-negative, stage TI B. N0 tumor with negative margins. She has completed radiation therapy in February and her skin has returned to normal. She has had her hip replacement and has done well with that. She is on anastrozole. She has no complaints about her breast  Mammograms performed on 06/24/2013 looked good, no focal abnormality, category 2.   She is scheduled to see Dr. Welton Flakes later this month.  Exam:  Patient looks well. No distress  Neck: No adenopathy or mass  Lungs: Auscultation bilaterally  Heart: Regular rate and rhythm. No murmur, no ectopy  Breast right breast reveals transverse incision, surgical absence of nipple areola, tissue soft. The skin healthy. No mass. No axillary adenopathy   Assessment:  Invasive carcinoma right breast, pathologic stage T1b., N0, receptor positive, HER-2-negative.  Doing well following breast conservation surgery, adjuvant radiation therapy and adjuvant antiestrogen therapy. No evidence of recurrence   Plan:  Continue anastrozole and regular followup with medical oncology  She plans to see Dr. Drue Second every 6 months. Bilateral mammograms in October, 2015 See me in one year    Amber [name], M.D., [facility]  General and Minimally invasive Surgery  Breast and Colorectal Surgery  Office: [phone]  Pager: [phone]

## 2013-08-10 NOTE — Patient Instructions (Signed)
Examination of both breasts and all of the lymph node areas today is normal. There is no evidence of cancer.  As you know, your mammograms from October are also normal, no focal abnormality.  Continue to take the arimadex medicine and keep your regularly scheduled appointments with Dr. Drue Second.  Return to see Dr. Derrell Lolling in one year, after you get her annual mammograms.

## 2013-08-12 ENCOUNTER — Other Ambulatory Visit: Payer: Self-pay | Admitting: Internal Medicine

## 2013-08-18 ENCOUNTER — Other Ambulatory Visit (HOSPITAL_BASED_OUTPATIENT_CLINIC_OR_DEPARTMENT_OTHER): Payer: BC Managed Care – PPO

## 2013-08-18 ENCOUNTER — Encounter: Payer: Self-pay | Admitting: Oncology

## 2013-08-18 ENCOUNTER — Ambulatory Visit (HOSPITAL_BASED_OUTPATIENT_CLINIC_OR_DEPARTMENT_OTHER): Payer: BC Managed Care – PPO | Admitting: Oncology

## 2013-08-18 VITALS — BP 123/65 | HR 64 | Temp 98.3°F | Resp 20 | Ht 65.0 in | Wt 144.4 lb

## 2013-08-18 DIAGNOSIS — C50311 Malignant neoplasm of lower-inner quadrant of right female breast: Secondary | ICD-10-CM

## 2013-08-18 DIAGNOSIS — M899 Disorder of bone, unspecified: Secondary | ICD-10-CM

## 2013-08-18 DIAGNOSIS — C50319 Malignant neoplasm of lower-inner quadrant of unspecified female breast: Secondary | ICD-10-CM

## 2013-08-18 DIAGNOSIS — Z170 Estrogen receptor positive status [ER+]: Secondary | ICD-10-CM

## 2013-08-18 LAB — COMPREHENSIVE METABOLIC PANEL (CC13)
Albumin: 4 g/dL (ref 3.5–5.0)
Alkaline Phosphatase: 79 U/L (ref 40–150)
Anion Gap: 8 mEq/L (ref 3–11)
CO2: 25 mEq/L (ref 22–29)
Calcium: 9.9 mg/dL (ref 8.4–10.4)
Chloride: 108 mEq/L (ref 98–109)
Glucose: 106 mg/dl (ref 70–140)
Potassium: 4 mEq/L (ref 3.5–5.1)
Sodium: 142 mEq/L (ref 136–145)
Total Protein: 7.5 g/dL (ref 6.4–8.3)

## 2013-08-18 LAB — CBC WITH DIFFERENTIAL/PLATELET
Eosinophils Absolute: 0.3 10*3/uL (ref 0.0–0.5)
HGB: 12 g/dL (ref 11.6–15.9)
MONO#: 0.6 10*3/uL (ref 0.1–0.9)
NEUT#: 3.8 10*3/uL (ref 1.5–6.5)
RBC: 4.54 10*6/uL (ref 3.70–5.45)
RDW: 18 % — ABNORMAL HIGH (ref 11.2–14.5)
WBC: 6.9 10*3/uL (ref 3.9–10.3)
lymph#: 2.1 10*3/uL (ref 0.9–3.3)

## 2013-08-18 NOTE — Telephone Encounter (Signed)
appts made and printed...td 

## 2013-08-18 NOTE — Progress Notes (Signed)
OFFICE PROGRESS NOTE  CC  Amber Found, MD [phone] [address], [address] [place] [address] Dr. Claud Kelp Dr. Lavell Luster Dr. Gunnar Bulla  DIAGNOSIS: 52 year old female with stage I invasive ductal carcinoma of the right breast status post central lumpectomy with sentinel lymph node biopsy on 07/15/2012.  PRIOR THERAPY:  #1 patient was seen originally by me on 06/30/2012 with new diagnosis of a 5 mm mass in the right breast at the 5:00 position. The biopsy showed invasive ductal carcinoma with associated DCIS tumor was ER +100% PR +60% Ki-67 6% and HER-2/neu negative. It was grade 1.  #2 patient has gone on to have a central lumpectomy on 07/15/2012 the final pathology revealed a 0.6 cm ER positive PR positive HER-2/neu negative low grade invasive ductal carcinoma. Sentinel node was negative for metastatic disease. Postoperatively she is doing well without any problems.  #3 patient is also scheduled to have left hip surgery performed on 08/17/2012. She is cleared from my perspective to proceed with this first.  #4 patient is status post radiation therapy to the right breast. She tolerated this well.  #5 proceed with adjuvant antiestrogen therapy consisting of Arimidex 1 mg daily. Total of 5 years of therapy is planned risks and benefits of this were discussed with the patient. She will take vitamin D calcium. We will also obtain a bone density scan.  CURRENT THERAPY: Arimidex 1 mg daily starting 11/04/2012.  INTERVAL HISTORY: Amber [name] 52 y.o. female is seen for followup.. She denies any fevers chills night sweats headaches shortness of breath chest pains palpitations no myalgias and arthralgias. She is ambulatory since her hip replacement. Remainder of the 10 point review of systems is negative.   MEDICAL HISTORY: Past Medical History  Diagnosis Date  . Hyperlipidemia   . Osteoporosis   . Hypertension     Dr. [facility]  . GERD  (gastroesophageal reflux disease)     hx of   . Neuromuscular disorder     carpal tunnel right hand  . Chronic neck pain     hx of  . Hypothyroidism   . Heart attack 2001    Dr. Isabel Caprice  . Anginal pain 2001  . Pernicious anemia   . Sickle cell trait   . Migraines     "last one was 1990's" (08/17/2012)  . DJD (degenerative joint disease) of hip     bil hip  . Breast cancer     invasive ductal carcinoma right breast; ER, PR + and HER 2 -  . History of radiation therapy 09/16/12- 10/19/12    right breast    ALLERGIES:  is allergic to betadine and tramadol.  MEDICATIONS:  Current Outpatient Prescriptions  Medication Sig Dispense Refill  . alendronate (FOSAMAX) 70 MG tablet TAKE ONE TABLET BY MOUTH ONCE A WEEK ON  SATURDAY  4 tablet  0  . aspirin EC 325 MG EC tablet Take 1 tablet (325 mg total) by mouth 2 (two) times daily.  60 tablet  1  . atenolol (TENORMIN) 25 MG tablet Take 25 mg by mouth daily.      Marland Kitchen BIOTIN PO Take 2 tablets by mouth daily.      . Cholecalciferol (VITAMIN D PO) Take 1 tablet by mouth daily.      . Coenzyme Q10 (COQ10 PO) Take 1 tablet by mouth daily.      . cyanocobalamin (,VITAMIN B-12,) 1000 MCG/ML injection Inject 1,000 mcg into the muscle every 30 (thirty) days.  As needed when feeling tired      . cyclobenzaprine (FLEXERIL) 10 MG tablet Take 10 mg by mouth 3 (three) times daily as needed. For muscle pain      . docusate sodium 100 MG CAPS Take 100 mg by mouth 2 (two) times daily.  30 capsule  1  . ezetimibe (ZETIA) 10 MG tablet Take 1 tablet (10 mg total) by mouth daily.  30 tablet  1  . GLUCOSAMINE-CHONDROITIN PO Take 1 tablet by mouth daily.      . hydrOXYzine (ATARAX/VISTARIL) 25 MG tablet       . IRON PO Take 1 tablet by mouth at bedtime as needed. When feeling tired      . levothyroxine (SYNTHROID, LEVOTHROID) 112 MCG tablet Take 125 mcg by mouth every morning.       Marland Kitchen lisinopril (PRINIVIL,ZESTRIL) 5 MG tablet Take 5 mg by mouth daily.      .  mometasone (NASONEX) 50 MCG/ACT nasal spray Place 2 sprays into the nose daily as needed. For allergies      . Omega-3 Fatty Acids (FISH OIL PO) Take 1 capsule by mouth daily.      . rosuvastatin (CRESTOR) 20 MG tablet Take 20 mg by mouth daily.      Marland Kitchen triamcinolone (KENALOG) 0.1 % paste       . zolpidem (AMBIEN) 10 MG tablet       . CHANTIX 1 MG tablet       . penicillin v potassium (VEETID) 500 MG tablet Per pt to take before dental appt and after      . promethazine (PHENERGAN) 25 MG tablet Take 1 tablet (25 mg total) by mouth every 6 (six) hours as needed for nausea.  30 tablet  0   No current facility-administered medications for this visit.    SURGICAL HISTORY:  Past Surgical History  Procedure Laterality Date  . Nasal sinus surgery  1990  . Coronary angioplasty with stent placement  2001    "1" (08/17/2012)  . Partial mastectomy with needle localization and axillary sentinel lymph node bx  07/15/2012    Procedure: PARTIAL MASTECTOMY WITH NEEDLE LOCALIZATION AND AXILLARY SENTINEL LYMPH NODE BX;  Surgeon: Ernestene Mention, MD;  Location: [facility] OR;  Service: General;  Laterality: Right;  . Total hip arthroplasty  08/17/2012    "right" (08/17/2012)  . Partial mastectomy with needle localization  07/2012    "right" (08/17/2012)  . Breast biopsy  06/2012    "right" (08/17/2012)  . Mastectomy    . Total hip arthroplasty  08/17/2012    Procedure: TOTAL HIP ARTHROPLASTY ANTERIOR APPROACH;  Surgeon: Velna Ochs, MD;  Location: [facility] OR;  Service: Orthopedics;  Laterality: Right;    REVIEW OF SYSTEMS:   General: fatigue (-), night sweats (-), fever (-), pain (-) Lymph: palpable nodes (-) HEENT: vision changes (-), mucositis (-), gum bleeding (-), epistaxis (-) Cardiovascular: chest pain (-), palpitations (-) Pulmonary: shortness of breath (-), dyspnea on exertion (-), cough (-), hemoptysis (-) GI:  Early satiety (-), melena (-), dysphagia (-), nausea/vomiting (-), diarrhea (-) GU:  dysuria (-), hematuria (-), incontinence (-) Musculoskeletal: joint swelling (-), joint pain (-), back pain (-) Neuro: weakness (-), numbness (-), headache (-), confusion (-) Skin: Rash (-), lesions (-), dryness (-) Psych: depression (-), suicidal/homicidal ideation (-), feeling of hopelessness (-)   PHYSICAL EXAMINATION: Blood pressure 123/65, pulse 64, temperature 98.3 F (36.8 C), temperature source Oral, resp. rate 20, height 5\' 5"  (1.651 m),  weight 144 lb 6.4 oz (65.499 kg). Body mass index is 24.03 kg/(m^2). General: Patient is a well appearing female in no acute distress HEENT: PERRLA, sclerae anicteric no conjunctival pallor, MMM Neck: supple, no palpable adenopathy Lungs: clear to auscultation bilaterally, no wheezes, rhonchi, or rales Cardiovascular: regular rate rhythm, S1, S2, no murmurs, rubs or gallops Abdomen: Soft, non-tender, non-distended, normoactive bowel sounds, no HSM Extremities: warm and well perfused, no clubbing, cyanosis, or edema Skin: No rashes or lesions Neuro: Non-focal Breasts: right breast central lumpectomy scar well healed, 1cm soft nodule in the central portion of the breast.  Left breast without masses, nodularity, or skin changes. Erythema and slight flaking of right chest wall.  ECOG PERFORMANCE STATUS: 1 - Symptomatic but completely ambulatory    LABORATORY DATA: Lab Results  Component Value Date   WBC 6.9 08/18/2013   HGB 12.0 08/18/2013   HCT 36.9 08/18/2013   MCV 81.2 08/18/2013   PLT 253 08/18/2013      Chemistry      Component Value Date/Time   NA 142 08/18/2013 0920   NA 141 08/18/2012 0545   K 4.0 08/18/2013 0920   K 4.1 08/18/2012 0545   CL 108* 02/16/2013 0917   CL 103 08/18/2012 0545   CO2 25 08/18/2013 0920   CO2 27 08/18/2012 0545   BUN 7.0 08/18/2013 0920   BUN 6 08/18/2012 0545   CREATININE 0.8 08/18/2013 0920   CREATININE 0.65 08/18/2012 0545      Component Value Date/Time   CALCIUM 9.9 08/18/2013 0920   CALCIUM  9.6 08/18/2012 0545   ALKPHOS 79 08/18/2013 0920   ALKPHOS 77 07/09/2012 1555   AST 28 08/18/2013 0920   AST 24 07/09/2012 1555   ALT 21 08/18/2013 0920   ALT 20 07/09/2012 1555   BILITOT 0.28 08/18/2013 0920   BILITOT 0.2* 07/09/2012 1555    Diagnosis 1. Lymph node, sentinel, biopsy, Right axillary - THERE IS NO EVIDENCE OF CARCINOMA IN 1 OF 1 LYMPH NODE (0/1). 2. Lymph node, sentinel, biopsy, Right axillary - THERE IS NO EVIDENCE OF CARCINOMA IN 1 OF 1 LYMPH NODE (0/1). 3. Breast, partial mastectomy, Right - INVASIVE DUCTAL CARCINOMA, GRADE I/III, SPANNING 0.6 CM. - THE SURGICAL RESECTION MARGINS ARE NEGATIVE FOR CARCINOMA. - SEE ONCOLOGY TABLE BELOW. Microscopic Comment 3. BREAST, INVASIVE TUMOR, WITH LYMPH NODE SAMPLING Specimen, including laterality: Right breast. Procedure: Mastectomy. Grade: I Tubule formation: 1 Nuclear pleomorphism: 2 Mitotic:1 Tumor size (gross measurement): 0.6 cm Margins: Negative for carcinoma Invasive, distance to closest margin: 2.0 cm to the posterior margin (gross measurement) Lymphovascular invasion: Not identified. Ductal carcinoma in situ: Not identified. Lobular neoplasia: Not identified. Tumor focality: Unifocal Treatment effect: N/A Extent of tumor: Confined to breast parenchyma. Lymph nodes: # examined: 2 Lymph nodes with metastasis: 0 Breast prognostic profile: [phone] 1 of 3 FINAL for Amber [name], Amber [name] ([id number]) Microscopic Comment(continued) Estrogen receptor: 100%, strong staining intensity. Progesterone receptor: 60%, strong staining intensity. Her 2 neu: No amplification was detected. The ratio was 1.11. Her 2 neu by [facility] will be repeated on the current case and the results reported separately. Ki-67: 6%. Non-neoplastic breast: Adenosis with calcifications, fibrocystic changes, and healing biopsy site. TNM: pT1b, pN0 (JBK:gt, 07/19/12) Pecola Leisure MD Pathologist, Electronic Signature (Case signed  07/19/2012) Specimen Gross and Clinical Information Specimen(s) Obtained: 1. Lymph node, sentinel, biopsy, Right axillary 2. Lymph node, sentinel, biopsy, Right axillary 3. Breast, partial mastectomy, Right Specimen Clinical Information   RADIOGRAPHIC STUDIES:  Chest 2 View  07/09/2012  *RADIOLOGY REPORT*  Clinical Data: Preoperative examination for right breast lumpectomy.  History of myocardial infarction.  Hypertension and smoking history.  CHEST - 2 VIEW  Comparison: 11/22/2006 to  Findings: The heart is mildly enlarged.  Mediastinal shadows are otherwise normal.  There are coronary artery stents.  The vascularity is normal.  Lungs are clear.  No effusions.  No bony abnormality.  IMPRESSION: Mild cardiomegaly.  Coronary artery stents.  No active disease evident.   Original Report Authenticated By: Paulina Fusi, M.D.    Nm Sentinel Node Inj-no Rpt (breast)  07/15/2012  CLINICAL DATA: Cancer right breast   Sulfur colloid was injected intradermally by the nuclear medicine  technologist for breast cancer sentinel node localization.     [place] Wire Localization Right  07/15/2012  *RADIOLOGY REPORT*  Clinical Data:  Recently diagnosed cancer at 5 o'clock in the right subareolar region.  RIGHT BREAST NEEDLE LOCALIZATION USING ULTRASOUND GUIDANCE AND SPECIMEN RADIOGRAPH  Patient presents for needle localization prior to surgical excision. The patient and I discussed the procedure of needle localization including benefits and alternatives. We discussed the high likelihood of a successful procedure. We discussed the risks of the procedure, including infection, bleeding, tissue injury, and further surgery. Informed written consent was given.  Using ultrasound guidance, sterile technique, 2% lidocaine, and a 5 cm modified Kopans needle, the mass at 5 o'clock in the subareolar region localized using a mediolateral approach. Films were labeled and sent with the patient to surgery.  She tolerated procedure well.   Specimen radiograph is performed at [facility] operating room and confirms the mass, clip, and wire to be present in the tissue sample.  The specimen is marked for pathology.  IMPRESSION: Needle localization right breast.  No apparent complications.   Original Report Authenticated By: Cain Saupe, M.D.    Mm Breast Surgical Specimen  07/15/2012  *RADIOLOGY REPORT*  Clinical Data:  Recently diagnosed cancer at 5 o'clock in the right subareolar region.  RIGHT BREAST NEEDLE LOCALIZATION USING ULTRASOUND GUIDANCE AND SPECIMEN RADIOGRAPH  Patient presents for needle localization prior to surgical excision. The patient and I discussed the procedure of needle localization including benefits and alternatives. We discussed the high likelihood of a successful procedure. We discussed the risks of the procedure, including infection, bleeding, tissue injury, and further surgery. Informed written consent was given.  Using ultrasound guidance, sterile technique, 2% lidocaine, and a 5 cm modified Kopans needle, the mass at 5 o'clock in the subareolar region localized using a mediolateral approach. Films were labeled and sent with the patient to surgery.  She tolerated procedure well.  Specimen radiograph is performed at [facility] operating room and confirms the mass, clip, and wire to be present in the tissue sample.  The specimen is marked for pathology.  IMPRESSION: Needle localization right breast.  No apparent complications.   Original Report Authenticated By: Cain Saupe, M.D.     ASSESSMENT: 52 year old female with  #1 stage I invasive ductal carcinoma of the right breast ER positive PR positive HER-2/neu negative with a low Ki-67 grade 1. Status post central lumpectomy with sentinel lymph node biopsy. Lymph nodes were negative for metastatic disease.   #2 patient with significant arthritis s/p hip replacement in December.  ##3 patient has completed radiation therapy to the breast.     #4 she  proceeded with adjuvant antiestrogen therapy consisting of Arimidex 1 mg daily. Risks and benefits of this were discussed with the patient as well as the  rationale and possible complications. Literature has been given to her as well.  #5 low bone mass on bone density on Fosamax   PLAN:   #1 patient will continue Arimidex 1 mg daily.  #2 osteopenia: Patient is on Fosamax.  #3 patient will be seen back in 6 months time in followup.  All questions were answered. The patient knows to call the clinic with any problems, questions or concerns. We can certainly see the patient much sooner if necessary.  I spent 25 minutes counseling the patient face to face. The total time spent in the appointment was 30 minutes.  Drue Second, MD Medical/Oncology [facility] [phone] (beeper) [phone] (Office)

## 2013-10-25 ENCOUNTER — Ambulatory Visit: Payer: BC Managed Care – PPO | Admitting: Interventional Cardiology

## 2013-11-01 ENCOUNTER — Encounter: Payer: Self-pay | Admitting: Cardiology

## 2013-11-01 ENCOUNTER — Ambulatory Visit (INDEPENDENT_AMBULATORY_CARE_PROVIDER_SITE_OTHER): Payer: BC Managed Care – PPO | Admitting: *Deleted

## 2013-11-01 DIAGNOSIS — E7800 Pure hypercholesterolemia, unspecified: Secondary | ICD-10-CM

## 2013-11-01 DIAGNOSIS — C50311 Malignant neoplasm of lower-inner quadrant of right female breast: Secondary | ICD-10-CM

## 2013-11-01 LAB — HEPATIC FUNCTION PANEL
ALBUMIN: 3.9 g/dL (ref 3.5–5.2)
ALT: 21 U/L (ref 0–35)
AST: 29 U/L (ref 0–37)
Alkaline Phosphatase: 61 U/L (ref 39–117)
Bilirubin, Direct: 0 mg/dL (ref 0.0–0.3)
Total Bilirubin: 0.6 mg/dL (ref 0.3–1.2)
Total Protein: 7 g/dL (ref 6.0–8.3)

## 2013-11-01 LAB — LIPID PANEL
Cholesterol: 104 mg/dL (ref 0–200)
HDL: 49.3 mg/dL (ref >39.00)
LDL Cholesterol: 43 mg/dL (ref 0–99)
TRIGLYCERIDES: 61 mg/dL (ref 0.0–149.0)
Total CHOL/HDL Ratio: 2
VLDL: 12.2 mg/dL (ref 0.0–40.0)

## 2013-11-03 ENCOUNTER — Encounter: Payer: Self-pay | Admitting: Cardiology

## 2013-11-03 ENCOUNTER — Ambulatory Visit (INDEPENDENT_AMBULATORY_CARE_PROVIDER_SITE_OTHER): Payer: BC Managed Care – PPO | Admitting: Interventional Cardiology

## 2013-11-03 ENCOUNTER — Encounter: Payer: Self-pay | Admitting: Interventional Cardiology

## 2013-11-03 VITALS — BP 126/49 | HR 59 | Ht 65.0 in | Wt 145.0 lb

## 2013-11-03 DIAGNOSIS — F17200 Nicotine dependence, unspecified, uncomplicated: Secondary | ICD-10-CM

## 2013-11-03 DIAGNOSIS — I252 Old myocardial infarction: Secondary | ICD-10-CM | POA: Insufficient documentation

## 2013-11-03 DIAGNOSIS — I10 Essential (primary) hypertension: Secondary | ICD-10-CM

## 2013-11-03 DIAGNOSIS — E782 Mixed hyperlipidemia: Secondary | ICD-10-CM | POA: Insufficient documentation

## 2013-11-03 DIAGNOSIS — I2510 Atherosclerotic heart disease of native coronary artery without angina pectoris: Secondary | ICD-10-CM | POA: Insufficient documentation

## 2013-11-03 NOTE — Progress Notes (Signed)
Patient ID: Amber [name], female   DOB: June 28, 1961, 53 y.o.   MRN: [id number]    [address], [place], [place]  [address] Phone: [phone] Fax:  [phone]  Date:  11/03/2013   ID:  Amber [name], DOB 1961-05-21, MRN [id number]  PCP:  Reginia Naas, MD      History of Present Illness: Amber [name] is a 53 y.o. female with a h/o anterior MI in 2001.  She had a stent placed by Dr. Leonia Reeves.  She had noted right calf pain. Improved on Crestor instead of lipitor. No nonhealing ulcers., SHe has plantar fasciitis. SHe had an MI in 2001. She received a stent at that time. At the time of her heart attack, she had nausea, diaphoresis and chest pain. SHe knew it was a heart attack.  Exercise: SHe does at least 60 minutes continuously. Hip repair is holding up. Zumba 3-4x/week. CAD/ASCVD:  Denies : Chest pain.  Dizziness.  Leg edema.  Palpitations.  Paroxysmal nocturnal dyspnea.  Syncope.     Wt Readings from Last 3 Encounters:  11/03/13 145 lb (65.772 kg)  08/18/13 144 lb 6.4 oz (65.499 kg)  08/10/13 145 lb (65.772 kg)     Past Medical History  Diagnosis Date  . Hyperlipidemia   . Osteoporosis   . Hypertension     Dr. [facility]  . GERD (gastroesophageal reflux disease)     hx of   . Neuromuscular disorder     carpal tunnel right hand  . Chronic neck pain     hx of  . Hypothyroidism   . Heart attack 2001    Dr. Scarlette Calico  . Anginal pain 2001  . Pernicious anemia   . Sickle cell trait   . Migraines     "last one was 1990's" (08/17/2012)  . DJD (degenerative joint disease) of hip     bil hip  . Breast cancer     invasive ductal carcinoma right breast; ER, PR + and HER 2 -  . History of radiation therapy 09/16/12- 10/19/12    right breast  . Tobacco abuse   . Allergic rhinitis     Current Outpatient Prescriptions  Medication Sig Dispense Refill  . alendronate (FOSAMAX) 70 MG tablet TAKE ONE TABLET BY MOUTH ONCE  A WEEK ON  SATURDAY  4 tablet  0  . aspirin 81 MG tablet Take 81 mg by mouth daily.      Marland Kitchen atenolol (TENORMIN) 25 MG tablet Take 25 mg by mouth daily.      Marland Kitchen BIOTIN PO Take 2 tablets by mouth daily.      . CHANTIX 1 MG tablet       . Cholecalciferol (VITAMIN D PO) Take 1 tablet by mouth daily.      . Coenzyme Q10 (COQ10 PO) Take 1 tablet by mouth daily.      . cyanocobalamin (,VITAMIN B-12,) 1000 MCG/ML injection Inject 1,000 mcg into the muscle every 30 (thirty) days. As needed when feeling tired      . cyclobenzaprine (FLEXERIL) 10 MG tablet Take 10 mg by mouth 3 (three) times daily as needed. For muscle pain      . docusate sodium 100 MG CAPS Take 100 mg by mouth 2 (two) times daily.  30 capsule  1  . ezetimibe (ZETIA) 10 MG tablet Take 1 tablet (10 mg total) by mouth daily.  30 tablet  1  . GLUCOSAMINE-CHONDROITIN PO Take 1  tablet by mouth daily.      . hydrOXYzine (ATARAX/VISTARIL) 25 MG tablet       . IRON PO Take 1 tablet by mouth at bedtime as needed. When feeling tired      . levothyroxine (SYNTHROID, LEVOTHROID) 125 MCG tablet Take 125 mcg by mouth daily before breakfast.      . lisinopril (PRINIVIL,ZESTRIL) 5 MG tablet Take 5 mg by mouth daily.      . mometasone (NASONEX) 50 MCG/ACT nasal spray Place 2 sprays into the nose daily as needed. For allergies      . nitroGLYCERIN (NITROSTAT) 0.4 MG SL tablet Place 0.4 mg under the tongue every 5 (five) minutes as needed for chest pain.      . Omega-3 Fatty Acids (FISH OIL PO) Take 1 capsule by mouth daily.      . penicillin v potassium (VEETID) 500 MG tablet Per pt to take before dental appt and after      . promethazine (PHENERGAN) 25 MG tablet Take 1 tablet (25 mg total) by mouth every 6 (six) hours as needed for nausea.  30 tablet  0  . rosuvastatin (CRESTOR) 20 MG tablet Take 20 mg by mouth daily.      Marland Kitchen. triamcinolone (KENALOG) 0.1 % paste       . zolpidem (AMBIEN) 10 MG tablet        No current facility-administered medications for  this visit.    Allergies:    Allergies  Allergen Reactions  . Betadine [Povidone Iodine] Rash  . Tramadol Nausea And Vomiting and Other (See Comments)    "couldn't stop vomiting" (08/17/2012)  . Benzoyl Peroxide Itching and Rash    Social History:  The patient  reports that she has been smoking Cigarettes.  She has a 17.5 pack-year smoking history. She has never used smokeless tobacco. She reports that she drinks alcohol. She reports that she does not use illicit drugs.   Family History:  The patient's family history includes Cancer in her cousin and maternal grandmother; Diabetes in her mother; Heart disease in her mother.   ROS:  Please see the history of present illness.  No nausea, vomiting.  No fevers, chills.  No focal weakness.  No dysuria. Hip is holding up; may need other hip operated upon.   All other systems reviewed and negative.   PHYSICAL EXAM: VS:  BP 126/49  Pulse 59  Ht 5\' 5"  (1.651 m)  Wt 145 lb (65.772 kg)  BMI 24.13 kg/m2 Well nourished, well developed, in no acute distress HEENT: normal Neck: no JVD, no carotid bruits Cardiac:  normal S1, S2; RRR;  Lungs:  clear to auscultation bilaterally, no wheezing, rhonchi or rales Abd: soft, nontender, no hepatomegaly Ext: no edema Skin: warm and dry Neuro:   no focal abnormalities noted  EKG:  Sinus bradycardia; otherwise normal   ASSESSMENT AND PLAN:  Coronary atherosclerosis of native coronary artery  Continue Aspir-81 Tablet Delayed Release, 81 MG, 1 tablet, Orally, Once a day Notes: No angina. Continue regular exercise. S/p anterior MI with stent placement in 2001. No CHF. 2. Combined hyperlipidemia  Refill Zetia Tablet, 10 MG, 1 tablet, Once a day, 30 days, 30, Refills 11 Refill Crestor Tablet, 20 MG, 1 tablet, Orally, Once a day, 30 days, 30, Refills 3 Notes: LDL 43 in 2015. Leg pain better on Crestor.  3. Current smoker  Notes: She needs to stop smoking. She has a prescription for Chantix and has  tolerated it well in  the past, except for initial strange dreams and nausea-but these went away after a while. She has not smoked for 22 days but then returned to smoking since I last saw her.  4. Hypertension, essential  Refill Atenolol Tablet, 25 MG, 1 tablet, Once a day, 90 days, 90, Refills 3 Refill Lisinopril Tablet, 5 MG, 1 tablet, Once a day, 90 days, 90, Refills 3 Notes: Controlled.    Signed, Mina Marble, MD, [facility] 11/03/2013 8:27 AM

## 2013-11-03 NOTE — Patient Instructions (Signed)
Your physician recommends that you continue on your current medications as directed. Please refer to the Current Medication list given to you today.  Your physician recommends that you return for a FASTING lipid profile: in 1 year on 11/02/14.  Your physician wants you to follow-up in: 1 year with Dr. Irish Lack. You will receive a reminder letter in the mail two months in advance. If you don't receive a letter, please call our office to schedule the follow-up appointment.

## 2013-11-10 ENCOUNTER — Other Ambulatory Visit: Payer: Self-pay | Admitting: Oncology

## 2013-11-11 ENCOUNTER — Other Ambulatory Visit: Payer: Self-pay | Admitting: *Deleted

## 2013-11-11 MED ORDER — EZETIMIBE 10 MG PO TABS: 10.0000 mg | ORAL_TABLET | Freq: Every day | ORAL | Status: DC

## 2013-11-11 MED ORDER — ROSUVASTATIN CALCIUM 20 MG PO TABS: 20.0000 mg | ORAL_TABLET | Freq: Every day | ORAL | Status: DC

## 2014-01-10 ENCOUNTER — Other Ambulatory Visit: Payer: Self-pay

## 2014-01-10 MED ORDER — ATENOLOL 25 MG PO TABS: 25.0000 mg | ORAL_TABLET | Freq: Every day | ORAL | Status: DC

## 2014-01-10 MED ORDER — LISINOPRIL 5 MG PO TABS: 5.0000 mg | ORAL_TABLET | Freq: Every day | ORAL | Status: DC

## 2014-01-17 ENCOUNTER — Other Ambulatory Visit: Payer: Self-pay | Admitting: *Deleted

## 2014-01-17 MED ORDER — LISINOPRIL 5 MG PO TABS: 5.0000 mg | ORAL_TABLET | Freq: Every day | ORAL | Status: DC

## 2014-01-17 MED ORDER — ATENOLOL 25 MG PO TABS: 25.0000 mg | ORAL_TABLET | Freq: Every day | ORAL | Status: DC

## 2014-02-10 ENCOUNTER — Other Ambulatory Visit: Payer: Self-pay | Admitting: Oncology

## 2014-02-20 ENCOUNTER — Telehealth: Payer: Self-pay | Admitting: Oncology

## 2014-02-20 NOTE — Telephone Encounter (Signed)
, °

## 2014-02-21 ENCOUNTER — Telehealth: Payer: Self-pay | Admitting: Oncology

## 2014-02-21 NOTE — Telephone Encounter (Signed)
, °

## 2014-02-27 ENCOUNTER — Other Ambulatory Visit: Payer: BC Managed Care – PPO

## 2014-02-27 ENCOUNTER — Ambulatory Visit: Payer: BC Managed Care – PPO | Admitting: Oncology

## 2014-03-17 ENCOUNTER — Other Ambulatory Visit: Payer: Self-pay | Admitting: Interventional Cardiology

## 2014-04-16 ENCOUNTER — Other Ambulatory Visit: Payer: Self-pay | Admitting: Oncology

## 2014-04-16 DIAGNOSIS — C50311 Malignant neoplasm of lower-inner quadrant of right female breast: Secondary | ICD-10-CM

## 2014-04-17 ENCOUNTER — Encounter: Payer: Self-pay | Admitting: Oncology

## 2014-04-17 ENCOUNTER — Other Ambulatory Visit (HOSPITAL_BASED_OUTPATIENT_CLINIC_OR_DEPARTMENT_OTHER): Payer: BC Managed Care – PPO

## 2014-04-17 ENCOUNTER — Ambulatory Visit (HOSPITAL_BASED_OUTPATIENT_CLINIC_OR_DEPARTMENT_OTHER): Payer: BC Managed Care – PPO | Admitting: Oncology

## 2014-04-17 ENCOUNTER — Telehealth: Payer: Self-pay | Admitting: Oncology

## 2014-04-17 VITALS — BP 115/55 | HR 53 | Temp 98.2°F | Resp 20 | Ht 65.0 in | Wt 147.6 lb

## 2014-04-17 DIAGNOSIS — M899 Disorder of bone, unspecified: Secondary | ICD-10-CM

## 2014-04-17 DIAGNOSIS — Z170 Estrogen receptor positive status [ER+]: Secondary | ICD-10-CM

## 2014-04-17 DIAGNOSIS — F17200 Nicotine dependence, unspecified, uncomplicated: Secondary | ICD-10-CM

## 2014-04-17 DIAGNOSIS — M949 Disorder of cartilage, unspecified: Secondary | ICD-10-CM

## 2014-04-17 DIAGNOSIS — K219 Gastro-esophageal reflux disease without esophagitis: Secondary | ICD-10-CM

## 2014-04-17 DIAGNOSIS — C50311 Malignant neoplasm of lower-inner quadrant of right female breast: Secondary | ICD-10-CM

## 2014-04-17 DIAGNOSIS — C50911 Malignant neoplasm of unspecified site of right female breast: Secondary | ICD-10-CM

## 2014-04-17 DIAGNOSIS — M8580 Other specified disorders of bone density and structure, unspecified site: Secondary | ICD-10-CM

## 2014-04-17 DIAGNOSIS — C50319 Malignant neoplasm of lower-inner quadrant of unspecified female breast: Secondary | ICD-10-CM

## 2014-04-17 LAB — CBC WITH DIFFERENTIAL/PLATELET
BASO%: 0.8 % (ref 0.0–2.0)
Basophils Absolute: 0.1 10*3/uL (ref 0.0–0.1)
EOS ABS: 0.4 10*3/uL (ref 0.0–0.5)
EOS%: 4.2 % (ref 0.0–7.0)
HEMATOCRIT: 40.8 % (ref 34.8–46.6)
HGB: 13.1 g/dL (ref 11.6–15.9)
LYMPH#: 2.9 10*3/uL (ref 0.9–3.3)
LYMPH%: 28.4 % (ref 14.0–49.7)
MCH: 27.9 pg (ref 25.1–34.0)
MCHC: 32.1 g/dL (ref 31.5–36.0)
MCV: 86.8 fL (ref 79.5–101.0)
MONO#: 1.1 10*3/uL — ABNORMAL HIGH (ref 0.1–0.9)
MONO%: 10.8 % (ref 0.0–14.0)
NEUT#: 5.7 10*3/uL (ref 1.5–6.5)
NEUT%: 55.8 % (ref 38.4–76.8)
Platelets: 241 10*3/uL (ref 145–400)
RBC: 4.7 10*6/uL (ref 3.70–5.45)
RDW: 19.6 % — AB (ref 11.2–14.5)
WBC: 10.2 10*3/uL (ref 3.9–10.3)

## 2014-04-17 LAB — COMPREHENSIVE METABOLIC PANEL (CC13)
ALBUMIN: 4 g/dL (ref 3.5–5.0)
ALT: 28 U/L (ref 0–55)
ANION GAP: 7 meq/L (ref 3–11)
AST: 26 U/L (ref 5–34)
Alkaline Phosphatase: 60 U/L (ref 40–150)
BUN: 8.8 mg/dL (ref 7.0–26.0)
CALCIUM: 9.9 mg/dL (ref 8.4–10.4)
CHLORIDE: 107 meq/L (ref 98–109)
CO2: 28 mEq/L (ref 22–29)
CREATININE: 0.8 mg/dL (ref 0.6–1.1)
GLUCOSE: 81 mg/dL (ref 70–140)
POTASSIUM: 4.1 meq/L (ref 3.5–5.1)
Sodium: 142 mEq/L (ref 136–145)
Total Bilirubin: 0.2 mg/dL (ref 0.20–1.20)
Total Protein: 7.2 g/dL (ref 6.4–8.3)

## 2014-04-17 NOTE — Progress Notes (Signed)
OFFICE PROGRESS NOTE   04/17/2014   Physicians: ( K.Khan), [name] (PCP), H.Dalbert Batman, J.Varanasi, _Vernado (gyn, [place]), S.Rogelio Seen, [facility]  INTERVAL HISTORY:  Patient is seen, for first time by this MD, previously followed by Dr Humphrey Rolls for stage 1 right breast cancer, on Arimidex since 11-04-2012. [facility], with heterogeneously dense breast tissue but otherwise no mammographic findings of concern. Last bone density scan was at [facility] 11-30-2012, with osteopenia. She is on yearly follow up with Dr Dalbert Batman, to see him again in Dec. She does not have scheduled follow up now with Dr Pablo Ledger.  She has not noticed any changes in either breast on self exam since last visit, tho she notes exam is difficulty on right breast due to irregularities since surgery/ RT. She has some hot flashes, unchanged. She has pain in left hip, Dr Novella Olive to follow up with xrays this week (post right total hip 08-2012). She does not seem to have arthralgias suggesting the aromatase inhibitor, as these tend to be symmetrical and begin distally.  She exercises (elliptical, treadmill, bike etc) 3x weekly and takes Ca + D (see below).     ONCOLOGIC HISTORY Patient found 5 mm subareolar mass in the right breast at the 5:00 position on self exam fall 2013; biopsy showed grade 1 invasive ductal carcinoma with associated DCIS tumor was ER +100% PR +60% Ki-67 6% and HER-2/neu negative. She had central lumpectomy 07/15/2012 by Dr Dalbert Batman, pathology finding 0.6 cm ER positive PR positive HER-2/neu negative low grade invasive ductal carcinoma. Sentinel node was negative for metastatic disease. She had radiation by Dr Pablo Ledger 52.72 Gy completed 10-19-12. She began arimidex 1 mg daily starting 11-04-12.    CURRENT THERAPY: Arimidex 1 mg daily starting 11/04/2012.  Review of systems as above, also: No recent infectious illness. No  chest pain or other cardiac symptoms. No SOB or other respiratory symptoms. Appetite ok. Bowels ok. No bleeding. No LE swelling. No GERD now. Remainder of 10 point Review of Systems negative. Physical exam with PCP upcoming.  Past Medical/ Surgical History Anterior MI 2001, HTN, hyperlipidemia GERD Hypothyroidism x > 15 yrs, followed by PCP Pernicious anemia: patient gives own B12 shots " not exactly monthly" Sickle cell trait DJD bilateral hips, post right hip arthroplasty 08-2012 Right breast cancer 06-2012, post lumpectomy/ sentinel node, RT, continuing aromatase inhibitor Osteopenia  Family History Cardiac disease No known cancer otherwise  Social History Lives alone in [place], with 2 dogs. Travels with work as Database administrator. Mother, brother and sister live locally. Per EMR she continues to smoke, tho I was not aware of this until after visit.  Objective:  Vital signs in last 24 hours:  BP 115/55  Pulse 53  Temp(Src) 98.2 F (36.8 C) (Oral)  Resp 20  Ht 5' 5"  (1.651 m)  Wt 147 lb 9.6 oz (66.951 kg)  BMI 24.56 kg/m2 weight is up ~ 4 lbs.  Alert, oriented and appropriate. Ambulatory without difficulty.  Normal hair pattern.  HEENT:PERRL, sclerae not icteric. Oral mucosa moist without lesions, posterior pharynx clear.  Neck supple. No JVD.  Lymphatics:no cervical,suraclavicular, axillary adenopathy Resp: clear to auscultation bilaterally and normal percussion bilaterally Cardio: regular rate and rhythm. No gallop. GI: soft, minimally tender in epigastrium,  not distended, no mass or organomegaly. Normally active bowel sounds. Surgical incision not remarkable. Musculoskeletal/ Extremities: without pitting edema, cords, tenderness. No swelling UE. Neuro:CN, motor, sensory, cerebellar nonfocal  PSYCH: does not  appear obviously depressed. Skin without rash, ecchymosis, petechiae Breasts: Left without dominant mass, skin or nipple findings. Right with central  lumpectomy scar well healed, nipple and areola absent. Some irregularily from lateral 1/3 of scar up towards 11:00 ~ 2.5 cm in length, and some irregularily consistent with scarring beneath incision laterally. No dominant mass in right breast. Axillary incision not remarkable.  Axillae benign.  Patient does not believe any changes in right breast, tho notes exam not easy for her with the irregularity since surgery/ RT.  Lab Results:  Results for orders placed in visit on 04/17/14  CBC WITH DIFFERENTIAL      Result Value Ref Range   WBC 10.2  3.9 - 10.3 10e3/uL   NEUT# 5.7  1.5 - 6.5 10e3/uL   HGB 13.1  11.6 - 15.9 g/dL   HCT 40.8  34.8 - 46.6 %   Platelets 241  145 - 400 10e3/uL   MCV 86.8  79.5 - 101.0 fL   MCH 27.9  25.1 - 34.0 pg   MCHC 32.1  31.5 - 36.0 g/dL   RBC 4.70  3.70 - 5.45 10e6/uL   RDW 19.6 (*) 11.2 - 14.5 %   lymph# 2.9  0.9 - 3.3 10e3/uL   MONO# 1.1 (*) 0.1 - 0.9 10e3/uL   Eosinophils Absolute 0.4  0.0 - 0.5 10e3/uL   Basophils Absolute 0.1  0.0 - 0.1 10e3/uL   NEUT% 55.8  38.4 - 76.8 %   LYMPH% 28.4  14.0 - 49.7 %   MONO% 10.8  0.0 - 14.0 %   EOS% 4.2  0.0 - 7.0 %   BASO% 0.8  0.0 - 2.0 %  COMPREHENSIVE METABOLIC PANEL (AQ76)      Result Value Ref Range   Sodium 142  136 - 145 mEq/L   Potassium 4.1  3.5 - 5.1 mEq/L   Chloride 107  98 - 109 mEq/L   CO2 28  22 - 29 mEq/L   Glucose 81  70 - 140 mg/dl   BUN 8.8  7.0 - 26.0 mg/dL   Creatinine 0.8  0.6 - 1.1 mg/dL   Total Bilirubin 0.20  0.20 - 1.20 mg/dL   Alkaline Phosphatase 60  40 - 150 U/L   AST 26  5 - 34 U/L   ALT 28  0 - 55 U/L   Total Protein 7.2  6.4 - 8.3 g/dL   Albumin 4.0  3.5 - 5.0 g/dL   Calcium 9.9  8.4 - 10.4 mg/dL   Anion Gap 7  3 - 11 mEq/L    No Vit D in EMR Studies/Results:  Bilateral mammograms and bone density ordered for Oct. Last CXR this EMR 07-2012: mild CM, coronary aretery stents, lungs clear.  Medications: I have reviewed the patient's current medications. Shje takes calcium  one daily, gets possibly one serving of calcium in diet daily. I have asked her to increase the calcium supplement to 600 mg bid, or to be sure to get 2-3 calcium rich servings in addition to one tablet daily.   DISCUSSION: patient not talkative/ does not give history easily, but does seem to understand discussion well including bone density concerns/ follow up/ calcium as above. Her insurance should cover bone density scan yearly due to known low bone density on chronic high risk medication (aromatase inhibitor), that information included clearly on request for bone density scan at [facility] with mammograms 06-24-13. I also reviewed arthralgias with aromatase inhibitors, exacerbation of hot flashes, possible worsening  of lipids. She understands that I will be in communication with other MDs by this note.  Patient prefers female MD in medical oncology. I have told her that we expect another female MD seeing breast patients in late 2015; she states that she is fine with seeing either myself or the new MD. As she will see Dr Dalbert Batman in Dec, she will come back to medical oncology in 6 months = Feb. She will have imaging at [facility] Oct.  Assessment/Plan:  1.Stage 1 (T1bN0) grade 1 ER PR + HER 2 negative invasive ductal carcinoma of central right breast: post central lumpectomy with sentinel node 06-2012, local radiation and on Arimidex since 11-04-2012. For bilateral mammograms 06-2014 at [facility]. To see Dr Dalbert Batman [phone]. Follow up medical oncology 6 months, or sooner if needed 2.osteopenia by bone density scan 11-30-2012. It is medically necessary for  yearly bone density scans while on high risk aromatase inhibitor. Increase calcium to at least 1200 mg daily. Should check Vit D level with next labs here if not done in past year by PCP or other. 3.known degenerative arthritis hips: has had right hip replacement, and for follow up with orthopedics for worse symptoms left hip  4.post MI 2001, HTN,  elevated lipids.  5.hx GERD, slightly tender epigastrium on exam. Discussed H2 blockers 6.Per EMR ongoing tobacco. I was not aware of this at time of visit, will need to address if accurate.   TIme spent 40 min including >50% counseling and coordination of care.  LIVESAY,LENNIS P, MD   04/17/2014, 11:19 AM

## 2014-04-17 NOTE — Telephone Encounter (Signed)
, °

## 2014-04-26 ENCOUNTER — Other Ambulatory Visit: Payer: Self-pay | Admitting: Family Medicine

## 2014-04-26 DIAGNOSIS — R51 Headache: Principal | ICD-10-CM

## 2014-04-26 DIAGNOSIS — R519 Headache, unspecified: Secondary | ICD-10-CM

## 2014-04-28 ENCOUNTER — Ambulatory Visit
Admission: RE | Admit: 2014-04-28 | Discharge: 2014-04-28 | Disposition: A | Discharge status: Home / Self Care | Payer: BC Managed Care – PPO | Source: Ambulatory Visit | Attending: Family Medicine | Admitting: Family Medicine

## 2014-04-28 DIAGNOSIS — R51 Headache: Principal | ICD-10-CM

## 2014-04-28 DIAGNOSIS — R519 Headache, unspecified: Secondary | ICD-10-CM

## 2014-05-15 ENCOUNTER — Other Ambulatory Visit: Payer: Self-pay | Admitting: Adult Health

## 2014-05-15 ENCOUNTER — Other Ambulatory Visit: Payer: Self-pay | Admitting: Interventional Cardiology

## 2014-06-17 ENCOUNTER — Telehealth: Payer: Self-pay | Admitting: Oncology

## 2014-06-17 NOTE — Telephone Encounter (Signed)
, °

## 2014-06-26 ENCOUNTER — Ambulatory Visit
Admission: RE | Admit: 2014-06-26 | Discharge: 2014-06-26 | Disposition: A | Discharge status: Home / Self Care | Payer: BC Managed Care – PPO | Source: Ambulatory Visit | Attending: Oncology | Admitting: Oncology

## 2014-06-26 DIAGNOSIS — C50911 Malignant neoplasm of unspecified site of right female breast: Secondary | ICD-10-CM

## 2014-08-02 ENCOUNTER — Other Ambulatory Visit: Payer: Self-pay | Admitting: Orthopaedic Surgery

## 2014-08-05 ENCOUNTER — Telehealth: Payer: Self-pay | Admitting: Oncology

## 2014-08-05 NOTE — Telephone Encounter (Signed)
s.w. pt and advised on d.t change per MD request....pt ok and aware

## 2014-08-09 ENCOUNTER — Ambulatory Visit (INDEPENDENT_AMBULATORY_CARE_PROVIDER_SITE_OTHER): Payer: BC Managed Care – PPO | Admitting: Neurology

## 2014-08-09 ENCOUNTER — Encounter: Payer: Self-pay | Admitting: Neurology

## 2014-08-09 ENCOUNTER — Other Ambulatory Visit: Payer: Self-pay | Admitting: Neurology

## 2014-08-09 VITALS — BP 138/66 | HR 70 | Temp 98.7°F | Resp 16 | Ht 65.0 in | Wt 146.5 lb

## 2014-08-09 DIAGNOSIS — R51 Headache: Secondary | ICD-10-CM

## 2014-08-09 DIAGNOSIS — R519 Headache, unspecified: Secondary | ICD-10-CM

## 2014-08-09 DIAGNOSIS — Z720 Tobacco use: Secondary | ICD-10-CM

## 2014-08-09 NOTE — Progress Notes (Signed)
NEUROLOGY CONSULTATION NOTE  Amber [name] MRN: [id number] DOB: [birth year]  Referring provider: Dr. Tamala Julian Primary care provider: Dr. Tamala Julian  Reason for consult:  headache  HISTORY OF PRESENT ILLNESS: Amber [name] is a 53 year old right-handed woman with tobacco abuse, CAD, hypothyroidism, hyperlipidemia, hypertension, DJD of the hip and history of breast cancer diagnosed in 2013 status post radiation who presents for headache.  Notes and CT reviewed.  Onset:  August Location:  Right temporal/retro-orbital Quality:  pounding Intensity:  8/10 Aura:  no Prodrome:  no Associated symptoms:  Feeling of congestion and aural fullness, osmophobia, flashing lights/sparks.  No nausea, photophobia or phonophobia Duration:  1 to several hours Frequency:  Initially daily until 07/15/14.  Marland Kitchen  Usually occurs in the evening.  Only two over the past month Triggers/exacerbating factors:  No specific trigger.  Does not occur with any particular activity and can occur spontaneously. Relieving factors:  Tylenol PM Activity:  Able to function  Past abortive therapy:  none Past preventative therapy:  none  Current abortive therapy:  Tylenol PM (helps), Aleve (moderately effective) Current preventative therapy:  atenolol 30m (BP) Other current medications: Fosamax, Arimidex, ASA 843m Co Q10, levothyroxine, Zetia, Ambien, B12  Caffeine:  At least 2 cups of coffee daily Alcohol:  Occasionally  Smoker:  yes Diet:  healthy Exercise:  At least 3 times a week Depression/stress:  Stress related to work (aDesigner, multimediaSleep hygiene:  Improved since starting CPAP over a year ago. She has remote history of migraines, associated with nausea and photophobia  04/28/14 CT HEAD WO:  Unremarkable.  She denies any vision loss.  She denies arthralgias, myalgias or fever.  PAST MEDICAL HISTORY: Past Medical History  Diagnosis Date  . Hyperlipidemia   . Osteoporosis   . Hypertension     [facility]  . GERD (gastroesophageal reflux disease)     hx of   . Neuromuscular disorder     carpal tunnel right hand  . Chronic neck pain     hx of  . Hypothyroidism   . Heart attack 2001    Dr. VaScarlette Calico. Anginal pain 2001  . Pernicious anemia   . Sickle cell trait   . Migraines     "last one was 1990's" (08/17/2012)  . DJD (degenerative joint disease) of hip     bil hip  . Breast cancer     invasive ductal carcinoma right breast; ER, PR + and HER 2 -  . History of radiation therapy 09/16/12- 10/19/12    right breast  . Tobacco abuse   . Allergic rhinitis     PAST SURGICAL HISTORY: Past Surgical History  Procedure Laterality Date  . Nasal sinus surgery  1990  . Coronary angioplasty with stent placement  2001    "1" (08/17/2012)  . Partial mastectomy with needle localization and axillary sentinel lymph node bx  07/15/2012    Procedure: PARTIAL MASTECTOMY WITH NEEDLE LOCALIZATION AND AXILLARY SENTINEL LYMPH NODE BX;  Surgeon: HaAdin HectorMD;  [facility] Service: [facility];  Laterality: Right;  . Total hip arthroplasty  08/17/2012    "right" (08/17/2012)  . Partial mastectomy with needle localization  07/2012    "right" (08/17/2012)  . Breast biopsy  06/2012    "right" (08/17/2012)  . Mastectomy    . Total hip arthroplasty  08/17/2012    Procedure: TOTAL HIP ARTHROPLASTY ANTERIOR APPROACH;  Surgeon: PeHessie DibbleMD;  [facility] [facility]  Service: Orthopedics;  Laterality: Right;    MEDICATIONS: Current Outpatient Prescriptions on File Prior to Visit  Medication Sig Dispense Refill  . alendronate (FOSAMAX) 70 MG tablet TAKE ONE TABLET BY MOUTH ONCE A WEEK ON  SATURDAY 4 tablet 0  . anastrozole (ARIMIDEX) 1 MG tablet TAKE ONE TABLET BY MOUTH ONCE DAILY 90 tablet 1  . aspirin 81 MG tablet Take 81 mg by mouth daily.    Marland Kitchen atenolol (TENORMIN) 25 MG tablet Take 1 tablet (25 mg total) by mouth daily. 90 tablet 1  . BIOTIN PO Take 2 tablets by mouth  daily.    . CHANTIX 1 MG tablet     . Cholecalciferol (VITAMIN D PO) Take 1 tablet by mouth daily.    . Coenzyme Q10 (COQ10 PO) Take 1 tablet by mouth daily.    . CRESTOR 20 MG tablet TAKE ONE TABLET BY MOUTH ONCE DAILY 30 tablet 5  . cyanocobalamin (,VITAMIN B-12,) 1000 MCG/ML injection Inject 1,000 mcg into the muscle every 30 (thirty) days. As needed when feeling tired    . cyclobenzaprine (FLEXERIL) 10 MG tablet Take 10 mg by mouth 3 (three) times daily as needed. For muscle pain    . docusate sodium 100 MG CAPS Take 100 mg by mouth 2 (two) times daily. 30 capsule 1  . GLUCOSAMINE-CHONDROITIN PO Take 1 tablet by mouth daily.    . IRON PO Take 1 tablet by mouth at bedtime as needed. When feeling tired    . levothyroxine (SYNTHROID, LEVOTHROID) 125 MCG tablet Take 125 mcg by mouth daily before breakfast.    . lisinopril (PRINIVIL,ZESTRIL) 5 MG tablet Take 1 tablet (5 mg total) by mouth daily. 90 tablet 1  . mometasone (NASONEX) 50 MCG/ACT nasal spray Place 2 sprays into the nose daily as needed. For allergies    . nitroGLYCERIN (NITROSTAT) 0.4 MG SL tablet Place 0.4 mg under the tongue every 5 (five) minutes as needed for chest pain.    . Omega-3 Fatty Acids (FISH OIL PO) Take 1 capsule by mouth daily.    . penicillin v potassium (VEETID) 500 MG tablet Per pt to take before dental appt and after    . promethazine (PHENERGAN) 25 MG tablet Take 1 tablet (25 mg total) by mouth every 6 (six) hours as needed for nausea. 30 tablet 0  . triamcinolone (KENALOG) 0.1 % paste     . ZETIA 10 MG tablet TAKE ONE TABLET BY MOUTH ONCE DAILY (Patient not taking: Reported on 08/09/2014) 30 tablet 5  . zolpidem (AMBIEN) 10 MG tablet      No current facility-administered medications on file prior to visit.    ALLERGIES: Allergies  Allergen Reactions  . Betadine [Povidone Iodine] Rash  . Tramadol Nausea And Vomiting and Other (See Comments)    "couldn't stop vomiting" (08/17/2012)  . Benzoyl Peroxide  Itching and Rash    FAMILY HISTORY: Family History  Problem Relation Age of Onset  . Diabetes Mother   . Heart disease Mother   . Cancer Maternal Grandmother     pancreatic  . Cancer Cousin     pancreatic  . Parkinson's disease Maternal Grandfather     SOCIAL HISTORY: History   Social History  . Marital Status: Single    Spouse Name: N/A    Number of Children: N/A  . Years of Education: N/A   Occupational History  . Not on file.   Social History Main Topics  . Smoking status: Current Some Day Smoker --  0.50 packs/day for 35 years    Types: Cigarettes  . Smokeless tobacco: Never Used  . Alcohol Use: Yes     Comment: 08/17/2012 "couple mixed drinks; couple times/month; sometimes it's beer"  . Drug Use: No  . Sexual Activity: Not Currently   Other Topics Concern  . Not on file   Social History Narrative    REVIEW OF SYSTEMS: Constitutional: No fevers, chills, or sweats, no generalized fatigue, change in appetite Eyes: No visual changes, double vision, eye pain Ear, nose and throat: No hearing loss, ear pain, nasal congestion, sore throat Cardiovascular: No chest pain, palpitations Respiratory:  No shortness of breath at rest or with exertion, wheezes GastrointestinaI: No nausea, vomiting, diarrhea, abdominal pain, fecal incontinence Genitourinary:  No dysuria, urinary retention or frequency Musculoskeletal:  No neck pain, back pain Integumentary: No rash, pruritus, skin lesions Neurological: as above Psychiatric: No depression, insomnia, anxiety Endocrine: No palpitations, fatigue, diaphoresis, mood swings, change in appetite, change in weight, increased thirst Hematologic/Lymphatic:  No anemia, purpura, petechiae. Allergic/Immunologic: no itchy/runny eyes, nasal congestion, recent allergic reactions, rashes  PHYSICAL EXAM: Filed Vitals:   08/09/14 0759  BP: 138/66  Pulse: 70  Temp: 98.7 F (37.1 C)  Resp: 16   General: No acute distress Head:   Normocephalic/atraumatic Eyes:  fundi unremarkable, without vessel changes, exudates, hemorrhages or papilledema. Neck: supple, no paraspinal tenderness, full range of motion Back: No paraspinal tenderness Heart: regular rate and rhythm Lungs: Clear to auscultation bilaterally. Vascular: No carotid bruits. Neurological Exam: Mental status: alert and oriented to person, place, and time, recent and remote memory intact, fund of knowledge intact, attention and concentration intact, speech fluent and not dysarthric, language intact. Cranial nerves: CN I: not tested CN II: pupils equal, round and reactive to light, visual fields intact, fundi unremarkable, without vessel changes, exudates, hemorrhages or papilledema. CN III, IV, VI:  full range of motion, no nystagmus, no ptosis CN V: facial sensation intact CN VII: upper and lower face symmetric CN VIII: hearing intact CN IX, X: gag intact, uvula midline CN XI: sternocleidomastoid and trapezius muscles intact CN XII: tongue midline Bulk & Tone: normal, no fasciculations. Motor:  5/5 throughout Sensation:  Temperature and vibration intact Deep Tendon Reflexes:  2+ throughout, toes downgoing Finger to nose testing:  No dysmetria Heel to shin:  No dysmetria Gait:  Normal station and stride.  Able to turn and walk in tandem. Romberg negative.  IMPRESSION: Right sided headache, probably migraine.  It has improved over the past month Tobacco abuse  PLAN: 1.  Since they have improved, we will monitor for now and she will continue Tylenol PM or Aleve or other OTC (Excedrin).  If they become more frequent again, will start a preventative. 2.  MRI of the brain with and without contrast 3.  ESR 4.  Smoking cessation 5.  Follow up in 3 months or as needed.  Thank you for allowing me to take part in the care of this patient.  Metta Clines, DO  CC: [name], MD

## 2014-08-09 NOTE — Patient Instructions (Addendum)
I suspect the headaches are migraines.  We will check for other causes too. 1.  MRI of the brain with and without contrast [facility]  08/16/14 8:54pm 2.  Sed Rate 3.  If headaches increase, please call. 4.  Follow up in 3 months or as needed.

## 2014-08-10 ENCOUNTER — Telehealth: Payer: Self-pay | Admitting: *Deleted

## 2014-08-10 LAB — SEDIMENTATION RATE: SED RATE: 4 mm/h (ref 0–22)

## 2014-08-10 NOTE — Telephone Encounter (Signed)
-----   Message from Dudley Major, DO sent at 08/10/2014  6:25 AM EST ----- Lab normal ----- Message -----    From: Lab in [facility] [place] [facility] Interface    Sent: 08/10/2014   1:36 AM      To: Dudley Major, DO

## 2014-08-10 NOTE — Telephone Encounter (Signed)
Patient is aware that labs are normal  

## 2014-08-16 ENCOUNTER — Encounter (HOSPITAL_BASED_OUTPATIENT_CLINIC_OR_DEPARTMENT_OTHER): Payer: Self-pay | Admitting: *Deleted

## 2014-08-16 ENCOUNTER — Ambulatory Visit (HOSPITAL_COMMUNITY)
Admission: RE | Admit: 2014-08-16 | Discharge: 2014-08-16 | Disposition: A | Discharge status: Home / Self Care | Payer: BC Managed Care – PPO | Source: Ambulatory Visit | Attending: Neurology | Admitting: Neurology

## 2014-08-16 DIAGNOSIS — G930 Cerebral cysts: Secondary | ICD-10-CM | POA: Diagnosis not present

## 2014-08-16 DIAGNOSIS — Z853 Personal history of malignant neoplasm of breast: Secondary | ICD-10-CM | POA: Insufficient documentation

## 2014-08-16 DIAGNOSIS — R51 Headache: Secondary | ICD-10-CM | POA: Diagnosis present

## 2014-08-16 DIAGNOSIS — R519 Headache, unspecified: Secondary | ICD-10-CM

## 2014-08-16 MED ORDER — GADOBENATE DIMEGLUMINE 529 MG/ML IV SOLN
15.0000 mL | Freq: Once | INTRAVENOUS | Status: AC | PRN
  Administered 2014-08-16: 13 mL via INTRAVENOUS

## 2014-08-16 NOTE — H&P (Signed)
Amber [name] is an 53 y.o. female.   Chief Complaint: Left wrist HPI: Amber [name] continues with left wrist pain.  We have injected her first dorsal compartment at least twice and she's achieved transient relief each time.  At this point she has trouble grasping things.  She wants a permanent solution.    Radiographs:  X-rays that were ordered, performed, and interpreted by me included multiple views of the left wrist demonstrate no acute bony abnormalities.  Past Medical History  Diagnosis Date  . Hyperlipidemia   . Osteoporosis   . Hypertension     Dr. Vickey Huger [facility]  . GERD (gastroesophageal reflux disease)     hx of   . Neuromuscular disorder     carpal tunnel right hand  . Chronic neck pain     hx of  . Hypothyroidism   . Heart attack 2001    Dr. Scarlette Calico  . Anginal pain 2001  . Pernicious anemia   . Sickle cell trait   . Migraines     "last one was 1990's" (08/17/2012)  . DJD (degenerative joint disease) of hip     bil hip  . Breast cancer     invasive ductal carcinoma right breast; ER, PR + and HER 2 -  . History of radiation therapy 09/16/12- 10/19/12    right breast  . Tobacco abuse   . Allergic rhinitis     Past Surgical History  Procedure Laterality Date  . Nasal sinus surgery  1990  . Coronary angioplasty with stent placement  2001    "1" (08/17/2012)  . Partial mastectomy with needle localization and axillary sentinel lymph node bx  07/15/2012    Procedure: PARTIAL MASTECTOMY WITH NEEDLE LOCALIZATION AND AXILLARY SENTINEL LYMPH NODE BX;  Surgeon: Adin Hector, MD;  Location: [place];  Service: General;  Laterality: Right;  . Total hip arthroplasty  08/17/2012    "right" (08/17/2012)  . Partial mastectomy with needle localization  07/2012    "right" (08/17/2012)  . Breast biopsy  06/2012    "right" (08/17/2012)  . Mastectomy    . Total hip arthroplasty  08/17/2012    Procedure: TOTAL HIP ARTHROPLASTY ANTERIOR APPROACH;  Surgeon: Hessie Dibble, MD;  Location: [place];  Service: Orthopedics;  Laterality: Right;    Family History  Problem Relation Age of Onset  . Diabetes Mother   . Heart disease Mother   . Cancer Maternal Grandmother     pancreatic  . Cancer Cousin     pancreatic  . Parkinson's disease Maternal Grandfather    Social History:  reports that she has been smoking Cigarettes.  She has a 17.5 pack-year smoking history. She has never used smokeless tobacco. She reports that she drinks alcohol. She reports that she does not use illicit drugs.  Allergies:  Allergies  Allergen Reactions  . Betadine [Povidone Iodine] Rash  . Tramadol Nausea And Vomiting and Other (See Comments)    "couldn't stop vomiting" (08/17/2012)  . Benzoyl Peroxide Itching and Rash    No prescriptions prior to admission    No results found for this or any previous visit (from the past 48 hour(s)). No results found.  Review of Systems  Musculoskeletal: Positive for joint pain.       Left wrist  All other systems reviewed and are negative.   There were no vitals taken for this visit. Physical Exam  Constitutional: She is oriented to person, place, and time. She appears well-developed  and well-nourished.  HENT:  Head: Normocephalic and atraumatic.  Eyes: Conjunctivae are normal. Pupils are equal, round, and reactive to light.  Neck: Normal range of motion.  Cardiovascular: Normal rate and regular rhythm.   Respiratory: Effort normal.  GI: Soft.  Musculoskeletal:  Left wrist has a markedly positive Finkelstein maneuver.  Her motion is full.  Sensation is intact.  She has good refill.  Opposite wrist is much less tender to testing.  Sensation and motor function are intact in all of her fingers with palpable pulses at both wrists.  She has good cervical motion and no palpable lymphadenopathy.    Neurological: She is alert and oriented to person, place, and time.  Skin: Skin is warm and dry.  Psychiatric: She has a normal mood  and affect. Her behavior is normal. Judgment and thought content normal.     Assessment/Plan Assessment: Left wrist de Quervain's injected x2  Plan: Amber [name] has some terrible pain in her wrist.  We have injected her on several occasions.  I believe she's also tried a brace.  I reviewed risk of anesthesia, infection, and numbness related to a first dorsal compartment release.  I told her she would be in a bulky bandage for a couple of days at which point she would be able to transition to a Band-Aid.  Amber [name], Amber [name] 08/16/2014, 11:49 AM

## 2014-08-16 NOTE — Progress Notes (Signed)
Will need istat- 

## 2014-08-17 ENCOUNTER — Telehealth: Payer: Self-pay | Admitting: *Deleted

## 2014-08-17 NOTE — Telephone Encounter (Signed)
Patient is aware of MRI Brain results

## 2014-08-17 NOTE — Telephone Encounter (Signed)
-----   Message from Dudley Major, DO sent at 08/17/2014  6:53 AM EST ----- There is nothing concerning on MRI.  Incidentally, it does show a tiny cyst, which is benign and not clinically relevant. ----- Message -----    From: Rad Results In Interface    Sent: 08/16/2014   9:05 PM      To: Dudley Major, DO

## 2014-08-18 ENCOUNTER — Encounter (HOSPITAL_BASED_OUTPATIENT_CLINIC_OR_DEPARTMENT_OTHER)
Admission: RE | Disposition: A | Discharge status: Home / Self Care | Payer: Self-pay | Source: Ambulatory Visit | Attending: Orthopaedic Surgery

## 2014-08-18 ENCOUNTER — Encounter (HOSPITAL_BASED_OUTPATIENT_CLINIC_OR_DEPARTMENT_OTHER): Payer: Self-pay | Admitting: *Deleted

## 2014-08-18 ENCOUNTER — Ambulatory Visit (HOSPITAL_BASED_OUTPATIENT_CLINIC_OR_DEPARTMENT_OTHER)
Admission: RE | Admit: 2014-08-18 | Discharge: 2014-08-18 | Disposition: A | Discharge status: Home / Self Care | Payer: BC Managed Care – PPO | Source: Ambulatory Visit | Attending: Orthopaedic Surgery | Admitting: Orthopaedic Surgery

## 2014-08-18 ENCOUNTER — Ambulatory Visit (HOSPITAL_BASED_OUTPATIENT_CLINIC_OR_DEPARTMENT_OTHER): Payer: BC Managed Care – PPO | Admitting: Anesthesiology

## 2014-08-18 ENCOUNTER — Other Ambulatory Visit: Payer: Self-pay | Admitting: Family Medicine

## 2014-08-18 ENCOUNTER — Other Ambulatory Visit (HOSPITAL_COMMUNITY)
Admission: RE | Admit: 2014-08-18 | Discharge: 2014-08-18 | Disposition: A | Discharge status: Home / Self Care | Payer: BC Managed Care – PPO | Source: Ambulatory Visit | Attending: Family Medicine | Admitting: Family Medicine

## 2014-08-18 DIAGNOSIS — E785 Hyperlipidemia, unspecified: Secondary | ICD-10-CM | POA: Insufficient documentation

## 2014-08-18 DIAGNOSIS — M25532 Pain in left wrist: Secondary | ICD-10-CM | POA: Diagnosis present

## 2014-08-18 DIAGNOSIS — K219 Gastro-esophageal reflux disease without esophagitis: Secondary | ICD-10-CM | POA: Insufficient documentation

## 2014-08-18 DIAGNOSIS — F17210 Nicotine dependence, cigarettes, uncomplicated: Secondary | ICD-10-CM | POA: Diagnosis not present

## 2014-08-18 DIAGNOSIS — M160 Bilateral primary osteoarthritis of hip: Secondary | ICD-10-CM | POA: Insufficient documentation

## 2014-08-18 DIAGNOSIS — Z1151 Encounter for screening for human papillomavirus (HPV): Secondary | ICD-10-CM | POA: Diagnosis present

## 2014-08-18 DIAGNOSIS — I252 Old myocardial infarction: Secondary | ICD-10-CM | POA: Diagnosis not present

## 2014-08-18 DIAGNOSIS — G4730 Sleep apnea, unspecified: Secondary | ICD-10-CM | POA: Insufficient documentation

## 2014-08-18 DIAGNOSIS — Z79899 Other long term (current) drug therapy: Secondary | ICD-10-CM | POA: Diagnosis not present

## 2014-08-18 DIAGNOSIS — I2510 Atherosclerotic heart disease of native coronary artery without angina pectoris: Secondary | ICD-10-CM | POA: Diagnosis not present

## 2014-08-18 DIAGNOSIS — Z7982 Long term (current) use of aspirin: Secondary | ICD-10-CM | POA: Insufficient documentation

## 2014-08-18 DIAGNOSIS — M810 Age-related osteoporosis without current pathological fracture: Secondary | ICD-10-CM | POA: Diagnosis not present

## 2014-08-18 DIAGNOSIS — I10 Essential (primary) hypertension: Secondary | ICD-10-CM | POA: Insufficient documentation

## 2014-08-18 DIAGNOSIS — D510 Vitamin B12 deficiency anemia due to intrinsic factor deficiency: Secondary | ICD-10-CM | POA: Insufficient documentation

## 2014-08-18 DIAGNOSIS — G8929 Other chronic pain: Secondary | ICD-10-CM | POA: Insufficient documentation

## 2014-08-18 DIAGNOSIS — Z01419 Encounter for gynecological examination (general) (routine) without abnormal findings: Secondary | ICD-10-CM | POA: Insufficient documentation

## 2014-08-18 DIAGNOSIS — G43909 Migraine, unspecified, not intractable, without status migrainosus: Secondary | ICD-10-CM | POA: Diagnosis not present

## 2014-08-18 DIAGNOSIS — Z792 Long term (current) use of antibiotics: Secondary | ICD-10-CM | POA: Insufficient documentation

## 2014-08-18 DIAGNOSIS — Z853 Personal history of malignant neoplasm of breast: Secondary | ICD-10-CM | POA: Diagnosis not present

## 2014-08-18 DIAGNOSIS — M542 Cervicalgia: Secondary | ICD-10-CM | POA: Diagnosis not present

## 2014-08-18 DIAGNOSIS — E039 Hypothyroidism, unspecified: Secondary | ICD-10-CM | POA: Diagnosis not present

## 2014-08-18 HISTORY — DX: Presence of spectacles and contact lenses: Z97.3

## 2014-08-18 HISTORY — DX: Sleep apnea, unspecified: G47.30

## 2014-08-18 HISTORY — PX: DORSAL COMPARTMENT RELEASE: SHX5039

## 2014-08-18 LAB — POCT I-STAT, CHEM 8
BUN: 8 mg/dL (ref 6–23)
Calcium, Ion: 1.34 mmol/L — ABNORMAL HIGH (ref 1.12–1.23)
Chloride: 106 mEq/L (ref 96–112)
Creatinine, Ser: 0.6 mg/dL (ref 0.50–1.10)
GLUCOSE: 88 mg/dL (ref 70–99)
HEMATOCRIT: 47 % — AB (ref 36.0–46.0)
HEMOGLOBIN: 16 g/dL — AB (ref 12.0–15.0)
POTASSIUM: 3.8 meq/L (ref 3.7–5.3)
Sodium: 144 mEq/L (ref 137–147)
TCO2: 23 mmol/L (ref 0–100)

## 2014-08-18 SURGERY — RELEASE, FIRST DORSAL COMPARTMENT, HAND
Anesthesia: Monitor Anesthesia Care | Site: Wrist | Laterality: Left

## 2014-08-18 MED ORDER — CEFAZOLIN SODIUM-DEXTROSE 2-3 GM-% IV SOLR
INTRAVENOUS | Status: AC
  Filled 2014-08-18: qty 50

## 2014-08-18 MED ORDER — PROMETHAZINE HCL 25 MG/ML IJ SOLN: 6.2500 mg | INTRAMUSCULAR | Status: DC | PRN

## 2014-08-18 MED ORDER — HYDROCODONE-ACETAMINOPHEN 5-325 MG PO TABS: 1.0000 | ORAL_TABLET | Freq: Four times a day (QID) | ORAL | Status: DC | PRN

## 2014-08-18 MED ORDER — CEFAZOLIN SODIUM-DEXTROSE 2-3 GM-% IV SOLR
2.0000 g | INTRAVENOUS | Status: AC
  Administered 2014-08-18: 2 g via INTRAVENOUS

## 2014-08-18 MED ORDER — PROPOFOL INFUSION 10 MG/ML OPTIME
INTRAVENOUS | Status: DC | PRN
  Administered 2014-08-18: 75 ug/kg/min via INTRAVENOUS

## 2014-08-18 MED ORDER — BUPIVACAINE HCL (PF) 0.25 % IJ SOLN
INTRAMUSCULAR | Status: DC | PRN
  Administered 2014-08-18: 5 mL

## 2014-08-18 MED ORDER — FENTANYL CITRATE 0.05 MG/ML IJ SOLN: 25.0000 ug | INTRAMUSCULAR | Status: DC | PRN

## 2014-08-18 MED ORDER — MIDAZOLAM HCL 2 MG/2ML IJ SOLN: 1.0000 mg | INTRAMUSCULAR | Status: DC | PRN

## 2014-08-18 MED ORDER — CHLORHEXIDINE GLUCONATE 4 % EX LIQD: 60.0000 mL | Freq: Once | CUTANEOUS | Status: DC

## 2014-08-18 MED ORDER — FENTANYL CITRATE 0.05 MG/ML IJ SOLN
INTRAMUSCULAR | Status: DC | PRN
  Administered 2014-08-18: 50 ug via INTRAVENOUS

## 2014-08-18 MED ORDER — LIDOCAINE HCL (PF) 0.5 % IJ SOLN
INTRAMUSCULAR | Status: DC | PRN
  Administered 2014-08-18: 30 mL via INTRAVENOUS

## 2014-08-18 MED ORDER — MIDAZOLAM HCL 5 MG/5ML IJ SOLN
INTRAMUSCULAR | Status: DC | PRN
  Administered 2014-08-18: 1 mg via INTRAVENOUS

## 2014-08-18 MED ORDER — FENTANYL CITRATE 0.05 MG/ML IJ SOLN: 50.0000 ug | INTRAMUSCULAR | Status: DC | PRN

## 2014-08-18 MED ORDER — LACTATED RINGERS IV SOLN
INTRAVENOUS | Status: DC
  Administered 2014-08-18: 14:00:00 via INTRAVENOUS

## 2014-08-18 MED ORDER — LACTATED RINGERS IV SOLN
INTRAVENOUS | Status: DC
  Administered 2014-08-18: 50 mL/h via INTRAVENOUS

## 2014-08-18 MED ORDER — MIDAZOLAM HCL 2 MG/2ML IJ SOLN
INTRAMUSCULAR | Status: AC
  Filled 2014-08-18: qty 2

## 2014-08-18 MED ORDER — LIDOCAINE HCL (CARDIAC) 20 MG/ML IV SOLN
INTRAVENOUS | Status: DC | PRN
  Administered 2014-08-18: 20 mg via INTRAVENOUS

## 2014-08-18 MED ORDER — ONDANSETRON HCL 4 MG/2ML IJ SOLN
INTRAMUSCULAR | Status: DC | PRN
  Administered 2014-08-18: 4 mg via INTRAVENOUS

## 2014-08-18 MED ORDER — FENTANYL CITRATE 0.05 MG/ML IJ SOLN
INTRAMUSCULAR | Status: AC
  Filled 2014-08-18: qty 6

## 2014-08-18 SURGICAL SUPPLY — 54 items
BANDAGE ELASTIC 3 VELCRO ST LF (GAUZE/BANDAGES/DRESSINGS) IMPLANT
BANDAGE ELASTIC 4 VELCRO ST LF (GAUZE/BANDAGES/DRESSINGS) IMPLANT
BLADE MINI RND TIP GREEN BEAV (BLADE) ×2 IMPLANT
BLADE SURG 15 STRL LF DISP TIS (BLADE) ×1 IMPLANT
BLADE SURG 15 STRL SS (BLADE) ×2
BNDG CMPR 9X4 STRL LF SNTH (GAUZE/BANDAGES/DRESSINGS)
BNDG ESMARK 4X9 LF (GAUZE/BANDAGES/DRESSINGS) IMPLANT
BNDG GAUZE ELAST 4 BULKY (GAUZE/BANDAGES/DRESSINGS) ×2 IMPLANT
CORDS BIPOLAR (ELECTRODE) ×2 IMPLANT
COVER BACK TABLE 60X90IN (DRAPES) ×2 IMPLANT
COVER MAYO STAND STRL (DRAPES) ×2 IMPLANT
CUFF TOURNIQUET SINGLE 18IN (TOURNIQUET CUFF) IMPLANT
DECANTER SPIKE VIAL GLASS SM (MISCELLANEOUS) IMPLANT
DRAPE EXTREMITY T 121X128X90 (DRAPE) ×2 IMPLANT
DRAPE SURG 17X23 STRL (DRAPES) ×2 IMPLANT
DRAPE U-SHAPE 47X51 STRL (DRAPES) IMPLANT
DRSG EMULSION OIL 3X3 NADH (GAUZE/BANDAGES/DRESSINGS) ×2 IMPLANT
DURAPREP 26ML APPLICATOR (WOUND CARE) ×1 IMPLANT
ELECT REM PT RETURN 9FT ADLT (ELECTROSURGICAL)
ELECTRODE REM PT RTRN 9FT ADLT (ELECTROSURGICAL) IMPLANT
GAUZE SPONGE 4X4 12PLY STRL (GAUZE/BANDAGES/DRESSINGS) ×2 IMPLANT
GAUZE SPONGE 4X4 16PLY XRAY LF (GAUZE/BANDAGES/DRESSINGS) IMPLANT
GLOVE BIO SURGEON STRL SZ 6.5 (GLOVE) ×1 IMPLANT
GLOVE BIO SURGEON STRL SZ8 (GLOVE) ×4 IMPLANT
GLOVE BIOGEL PI IND STRL 7.0 (GLOVE) IMPLANT
GLOVE BIOGEL PI IND STRL 8 (GLOVE) ×2 IMPLANT
GLOVE BIOGEL PI INDICATOR 7.0 (GLOVE) ×2
GLOVE BIOGEL PI INDICATOR 8 (GLOVE) ×2
GOWN STRL REUS W/ TWL LRG LVL3 (GOWN DISPOSABLE) ×1 IMPLANT
GOWN STRL REUS W/ TWL XL LVL3 (GOWN DISPOSABLE) ×2 IMPLANT
GOWN STRL REUS W/TWL LRG LVL3 (GOWN DISPOSABLE) ×2
GOWN STRL REUS W/TWL XL LVL3 (GOWN DISPOSABLE) ×4
NDL HYPO 25X1 1.5 SAFETY (NEEDLE) IMPLANT
NEEDLE HYPO 25X1 1.5 SAFETY (NEEDLE) IMPLANT
NS IRRIG 1000ML POUR BTL (IV SOLUTION) ×2 IMPLANT
PACK BASIN DAY SURGERY FS (CUSTOM PROCEDURE TRAY) ×2 IMPLANT
PAD CAST 3X4 CTTN HI CHSV (CAST SUPPLIES) ×1 IMPLANT
PADDING CAST ABS 4INX4YD NS (CAST SUPPLIES) ×1
PADDING CAST ABS COTTON 4X4 ST (CAST SUPPLIES) ×1 IMPLANT
PADDING CAST COTTON 3X4 STRL (CAST SUPPLIES) ×2
PENCIL BUTTON HOLSTER BLD 10FT (ELECTRODE) IMPLANT
SHEET MEDIUM DRAPE 40X70 STRL (DRAPES) IMPLANT
SLEEVE SCD COMPRESS KNEE MED (MISCELLANEOUS) IMPLANT
SPLINT PLASTER CAST XFAST 3X15 (CAST SUPPLIES) IMPLANT
SPLINT PLASTER XTRA FASTSET 3X (CAST SUPPLIES)
STOCKINETTE 4X48 STRL (DRAPES) ×2 IMPLANT
SUT ETHILON 4 0 PS 2 18 (SUTURE) ×4 IMPLANT
SUT VIC AB 4-0 P-3 18XBRD (SUTURE) IMPLANT
SUT VIC AB 4-0 P3 18 (SUTURE)
SYR BULB 3OZ (MISCELLANEOUS) IMPLANT
SYR CONTROL 10ML LL (SYRINGE) IMPLANT
TOWEL OR 17X24 6PK STRL BLUE (TOWEL DISPOSABLE) ×4 IMPLANT
TOWEL OR NON WOVEN STRL DISP B (DISPOSABLE) ×2 IMPLANT
UNDERPAD 30X30 INCONTINENT (UNDERPADS AND DIAPERS) ×2 IMPLANT

## 2014-08-18 NOTE — Interval H&P Note (Signed)
H&P documentation:  -History and Physical Reviewed  -Patient has been re-examined  -No change in the plan of care  Amber [name] G

## 2014-08-18 NOTE — Discharge Instructions (Signed)

## 2014-08-18 NOTE — H&P (Signed)
Amber [name] is an 53 y.o. female.     Chief Complaint: Left wrist pain                             History of present illness:                        Amber [name] is complaining of left wrist pain despite 2 injections in her first dorsal compartment recently.  Pain is relieved with injection transiently but comes back.  Pain with grasping and activities of daily living.  She has also been braced with minimal benefit.  We have talked about doing a first dorsal compartment release to alleviate her: She would like to proceed with surgical release  Past Medical History  Diagnosis Date  . Hyperlipidemia   . Osteoporosis   . Hypertension     Dr. [facility]  . GERD (gastroesophageal reflux disease)     hx of   . Neuromuscular disorder     carpal tunnel right hand  . Chronic neck pain     hx of  . Hypothyroidism   . Heart attack 2001    Dr. Scarlette Calico  . Anginal pain 2001  . Pernicious anemia   . Sickle cell trait   . Migraines     "last one was 1990's" (08/17/2012)  . DJD (degenerative joint disease) of hip     bil hip  . Breast cancer     invasive ductal carcinoma right breast; ER, PR + and HER 2 -  . History of radiation therapy 09/16/12- 10/19/12    right breast  . Tobacco abuse   . Allergic rhinitis   . Sleep apnea     does use a cpap  . Wears glasses     Past Surgical History  Procedure Laterality Date  . Nasal sinus surgery  1990  . Coronary angioplasty with stent placement  2001    "1" (08/17/2012)  . Partial mastectomy with needle localization and axillary sentinel lymph node bx  07/15/2012    Procedure: PARTIAL MASTECTOMY WITH NEEDLE LOCALIZATION AND AXILLARY SENTINEL LYMPH NODE BX;  Surgeon: Adin Hector, MD;  Location: [facility] [place];  Service: General;  Laterality: Right;  . Total hip arthroplasty  08/17/2012    "right" (08/17/2012)  . Breast biopsy  06/2012    "right" (08/17/2012)  . Total hip arthroplasty  08/17/2012    Procedure: TOTAL HIP  ARTHROPLASTY ANTERIOR APPROACH;  Surgeon: Hessie Dibble, MD;  Location: [facility];  Service: Orthopedics;  Laterality: Right;  . Colonoscopy      Family History  Problem Relation Age of Onset  . Diabetes Mother   . Heart disease Mother   . Cancer Maternal Grandmother     pancreatic  . Cancer Cousin     pancreatic  . Parkinson's disease Maternal Grandfather    Social History:  reports that she has been smoking Cigarettes.  She has a 17.5 pack-year smoking history. She has never used smokeless tobacco. She reports that she drinks alcohol. She reports that she does not use illicit drugs.  Allergies:  Allergies  Allergen Reactions  . Betadine [Povidone Iodine] Rash  . Tramadol Nausea And Vomiting and Other (See Comments)    "couldn't stop vomiting" (08/17/2012)  . Benzoyl Peroxide Itching and Rash    Medications Prior to Admission  Medication Sig Dispense Refill  . alendronate (FOSAMAX) 70 MG  tablet TAKE ONE TABLET BY MOUTH ONCE A WEEK ON  SATURDAY 4 tablet 0  . amoxicillin (AMOXIL) 500 MG capsule     . anastrozole (ARIMIDEX) 1 MG tablet TAKE ONE TABLET BY MOUTH ONCE DAILY 90 tablet 1  . aspirin 81 MG tablet Take 81 mg by mouth daily.    Marland Kitchen atenolol (TENORMIN) 25 MG tablet Take 1 tablet (25 mg total) by mouth daily. 90 tablet 1  . BIOTIN PO Take 2 tablets by mouth daily.    . CHANTIX 1 MG tablet     . Cholecalciferol (VITAMIN D PO) Take 1 tablet by mouth daily.    . Coenzyme Q10 (COQ10 PO) Take 1 tablet by mouth daily.    . CRESTOR 20 MG tablet TAKE ONE TABLET BY MOUTH ONCE DAILY 30 tablet 5  . cyanocobalamin (,VITAMIN B-12,) 1000 MCG/ML injection Inject 1,000 mcg into the muscle every 30 (thirty) days. As needed when feeling tired    . cyclobenzaprine (FLEXERIL) 10 MG tablet Take 10 mg by mouth 3 (three) times daily as needed. For muscle pain    . docusate sodium 100 MG CAPS Take 100 mg by mouth 2 (two) times daily. 30 capsule 1  . GLUCOSAMINE-CHONDROITIN PO Take 1 tablet by mouth  daily.    . IRON PO Take 1 tablet by mouth at bedtime as needed. When feeling tired    . levothyroxine (SYNTHROID, LEVOTHROID) 88 MCG tablet     . lisinopril (PRINIVIL,ZESTRIL) 5 MG tablet Take 1 tablet (5 mg total) by mouth daily. 90 tablet 1  . mometasone (NASONEX) 50 MCG/ACT nasal spray Place 2 sprays into the nose daily as needed. For allergies    . nitroGLYCERIN (NITROSTAT) 0.4 MG SL tablet Place 0.4 mg under the tongue every 5 (five) minutes as needed for chest pain.    . Omega-3 Fatty Acids (FISH OIL PO) Take 1 capsule by mouth daily.    . penicillin v potassium (VEETID) 500 MG tablet Per pt to take before dental appt and after    . promethazine (PHENERGAN) 25 MG tablet Take 1 tablet (25 mg total) by mouth every 6 (six) hours as needed for nausea. 30 tablet 0  . triamcinolone (KENALOG) 0.1 % paste     . valACYclovir (VALTREX) 500 MG tablet     . ZETIA 10 MG tablet TAKE ONE TABLET BY MOUTH ONCE DAILY (Patient not taking: Reported on 08/09/2014) 30 tablet 5  . zolpidem (AMBIEN) 10 MG tablet       No results found for this or any previous visit (from the past 48 hour(s)). Mr Jeri Cos Wo Contrast  08/16/2014   CLINICAL DATA:  Right-sided headaches. Personal history of breast cancer 2013.  EXAM: MRI HEAD WITHOUT AND WITH CONTRAST  TECHNIQUE: Multiplanar, multiecho [name] sequences of the brain and surrounding structures were obtained without and with intravenous contrast.  CONTRAST:  13 mL MultiHance  COMPARISON:  CT head without contrast 04/28/2014.  FINDINGS: A 16 mm pineal cyst is present. There is no associated enhancement or soft tissue component. Midline structures are otherwise within normal limits.  No acute infarct, hemorrhage, or mass lesion is present. The ventricles are of normal size. No significant extraaxial fluid collection is present.  There is no significant white matter disease. Flow is present in the major intracranial arteries. The globes and orbits are intact.  Mild mucosal  thickening is present in the anterior ethmoid air cells and frontal sinuses bilaterally. The left sphenoid sinus is opacified. Bilateral  mastoid effusions are present. There is no obstructing nasopharyngeal lesion.  The postcontrast images demonstrate no pathologic enhancement.  IMPRESSION: 1. 16 mm benign appearing peroneal cyst. 2. Otherwise normal MRI appearance of the brain. No acute or focal parenchymal lesion to explain headaches. 3. Mild anterior ethmoid and frontal sinus disease could contribute to headaches.   Electronically Signed   By: Lawrence Santiago M.D.   On: 08/16/2014 21:03    Review of Systems  Constitutional: Negative.   HENT: Negative.   Eyes: Negative.   Cardiovascular: Negative.   Gastrointestinal: Negative.   Genitourinary: Negative.   Musculoskeletal: Positive for joint pain.  Skin: Negative.   Neurological: Negative.   Endo/Heme/Allergies: Negative.   Psychiatric/Behavioral: Negative.     Height 5\' 5"  (1.651 m), weight 66.225 kg (146 lb). Physical Exam  Constitutional: She appears well-nourished.  HENT:  Head: Atraumatic.  Eyes: EOM are normal.  Neck: Neck supple.  Cardiovascular: Normal rate.   Respiratory: Effort normal.  GI: Soft.  Musculoskeletal:  Left thumb and wrist exam markedly positive Finkelstein's maneuver.  Otherwise full motion.  Sensation intact  Neurological: She is alert.  Skin: Skin is warm.     Assessment/Plan Assessment: Left first dorsal compartment tendinitis injected twice Plan: She has failed conservative treatment consisting of bracing and injection, persists with first dorsal compartment tendinitis left wrist.  We have discussed proceeding with a surgical release the risk of anesthesia, infection and nerve injury.  Discussed the need for potential postoperative physical therapy as well.  Amber [name] R 08/18/2014, 12:47 PM

## 2014-08-18 NOTE — Op Note (Signed)
#  [id number] 

## 2014-08-18 NOTE — Anesthesia Postprocedure Evaluation (Signed)
Anesthesia Post Note  Patient: Amber [name]  Procedure(s) Performed: Procedure(s) (LRB): LEFT FIRST RELEASE DORSAL COMPARTMENT (DEQUERVAIN) (Left)  Anesthesia type: MAC  Patient location: PACU  Post pain: Pain level controlled  Post assessment: Patient's Cardiovascular Status Stable  Last Vitals:  Filed Vitals:   08/18/14 1633  BP: 125/55  Pulse: 60  Temp: 36.4 C  Resp: 20    Post vital signs: Reviewed and stable  Level of consciousness: sedated  Complications: No apparent anesthesia complications

## 2014-08-18 NOTE — Anesthesia Preprocedure Evaluation (Addendum)
Anesthesia Evaluation  Patient identified by MRN, date of birth, ID band Patient awake    Reviewed: Allergy & Precautions, H&P , NPO status , Patient's Chart, lab work & pertinent test results, reviewed documented beta blocker date and time   Airway Mallampati: II  TM Distance: >3 FB Neck ROM: Full    Dental   Pulmonary sleep apnea , Current Smoker,          Cardiovascular hypertension, Pt. on medications and Pt. on home beta blockers - angina+ CAD, + Past MI and + Cardiac Stents     Neuro/Psych  Headaches, negative psych ROS   GI/Hepatic Neg liver ROS, GERD-  ,  Endo/Other  Hypothyroidism   Renal/GU negative Renal ROS     Musculoskeletal  (+) Arthritis -,   Abdominal   Peds  Hematology negative hematology ROS (+)   Anesthesia Other Findings   Reproductive/Obstetrics                            Anesthesia Physical Anesthesia Plan  ASA: III  Anesthesia Plan: MAC and Bier Block   Post-op Pain Management:    Induction: Intravenous  Airway Management Planned: Simple Face Mask and Natural Airway  Additional Equipment:   Intra-op Plan:   Post-operative Plan:   Informed Consent: I have reviewed the patients History and Physical, chart, labs and discussed the procedure including the risks, benefits and alternatives for the proposed anesthesia with the patient or authorized representative who has indicated his/her understanding and acceptance.     Plan Discussed with: CRNA and Surgeon  Anesthesia Plan Comments:        Anesthesia Quick Evaluation

## 2014-08-18 NOTE — Transfer of Care (Signed)
Immediate Anesthesia Transfer of Care Note  Patient: Amber [name]  Procedure(s) Performed: Procedure(s): LEFT FIRST RELEASE DORSAL COMPARTMENT (DEQUERVAIN) (Left)  Patient Location: PACU  Anesthesia Type:Bier block  Level of Consciousness: awake, alert , oriented and patient cooperative  Airway & Oxygen Therapy: Patient Spontanous Breathing and Patient connected to face mask oxygen  Post-op Assessment: Report given to PACU RN and Post -op Vital signs reviewed and stable  Post vital signs: Reviewed and stable  Complications: No apparent anesthesia complications

## 2014-08-18 NOTE — Anesthesia Procedure Notes (Addendum)
Procedure Name: MAC Date/Time: 08/18/2014 3:10 PM Performed by: Keyuana Wank Pre-anesthesia Checklist: Patient identified, Emergency Drugs available, Suction available, Patient being monitored and Timeout performed Patient Re-evaluated:Patient Re-evaluated prior to inductionOxygen Delivery Method: Simple face mask   Anesthesia Regional Block:  Bier block (IV Regional)  Pre-Anesthetic Checklist: ,, timeout performed, Correct Patient, Correct Site, Correct Laterality, Correct Procedure,, site marked, surgical consent,, at surgeon's request Needles:  Injection technique: Single-shot  Needle Type: Other      Needle Gauge: 20 and 20 G    Additional Needles: Bier block (IV Regional) Narrative:   Performed by: Personally

## 2014-08-21 ENCOUNTER — Encounter (HOSPITAL_BASED_OUTPATIENT_CLINIC_OR_DEPARTMENT_OTHER): Payer: Self-pay | Admitting: Orthopaedic Surgery

## 2014-08-21 LAB — CYTOLOGY - PAP

## 2014-08-21 NOTE — Op Note (Signed)
NAMEMarland Kitchen  GERALYNN, CAPRI NO.:  [id number]  MEDICAL RECORD NO.:  [id number]  LOCATION:                                 FACILITY:  PHYSICIAN:  Monico Blitz. Icholas Irby, M.D.DATE OF BIRTH:  29-Jun-1961  DATE OF PROCEDURE:  08/18/2014 DATE OF DISCHARGE:  08/18/2014                              OPERATIVE REPORT   PREOPERATIVE DIAGNOSIS:  Left wrist de Quervain's tenosynovitis.  POSTOPERATIVE DIAGNOSIS:  Left wrist de Quervain's tenosynovitis.  PROCEDURE:  Left wrist first dorsal compartment release.  ANESTHESIA:  Bier block and MAC.  SURGEON:  Monico Blitz. Rhona Raider, M.D.  ASSISTANTDorris Fetch, PA  INDICATION FOR PROCEDURE:  The patient is a 53 year old woman with a long history of left wrist pain consistent with de Quervain's tenosynovitis.  She has persisted with difficulty despite at least 2 injections, both of which did help her transiently.  At this point, she has trouble using her hand and has terrible pain.  She is offered a release.  Informed operative consent was obtained after discussion of possible complications including reaction to anesthesia, infection, and neurovascular injury.  SUMMARY OF FINDINGS AND PROCEDURE:  Under forearm bier block and MAC, a left wrist first dorsal compartment release was performed.  She had some 1 slip of APL and 2 slips of EPB tendon, all of which were encased in very thick tissue near the radial styloid.  These were all released completely.  She was discharged home same day.  DESCRIPTION OF PROCEDURE:  The patient was taken to the operating suite where bier block was applied to the level of forearm.  She was also given some sedation.  She was positioned supine and prepped and draped in normal sterile fashion.  After the administration of preop IV Kefzol and an appropriate time-out, the small incision was made transversely just proximal to the radial styloid.  Dissection was carried down to the first dorsal compartment.  She had some  very thick tissues here and these were released as described above.  There were 3 total slips of tendon, with 3 separate compartments.  These were all released completely and the tendons moved freely thereafter.  The wound was irrigated.  The tourniquet was deflated and a small amount of bleeding was easily controlled with some pressure.  We then reapproximated the skin with vertical mattresses of nylon.  I injected some Marcaine about the incision site.  Adaptic was applied followed by dry gauze and loose Ace wrap.  Estimated blood loss and intraoperative fluids can be obtained from anesthesia records as can accurate tourniquet time.  DISPOSITION:  The patient was taken to recovery room in stable condition.  She was to go home same-day and follow up in the office closely.  I will contact her by phone tonight.     Monico Blitz Rhona Raider, M.D.     PGD/MEDQ  D:  08/18/2014  T:  08/19/2014  Job:  [id number]

## 2014-09-22 ENCOUNTER — Ambulatory Visit: Payer: Self-pay | Admitting: Podiatry

## 2014-09-26 ENCOUNTER — Telehealth: Payer: Self-pay | Admitting: Emergency Medicine

## 2014-09-26 ENCOUNTER — Telehealth: Payer: Self-pay | Admitting: Oncology

## 2014-09-26 NOTE — Telephone Encounter (Signed)
, °

## 2014-09-28 ENCOUNTER — Other Ambulatory Visit: Payer: Self-pay | Admitting: *Deleted

## 2014-09-28 MED ORDER — ROSUVASTATIN CALCIUM 20 MG PO TABS: 20.0000 mg | ORAL_TABLET | Freq: Every day | ORAL | Status: DC

## 2014-10-04 ENCOUNTER — Telehealth: Payer: Self-pay | Admitting: Neurology

## 2014-10-04 NOTE — Telephone Encounter (Signed)
Pt called canceling her f/u for 11/10/14. Pt is working out of town and will not be able to make it. Pt will call later to r/s.

## 2014-10-13 ENCOUNTER — Other Ambulatory Visit: Payer: Self-pay | Admitting: Dermatology

## 2014-10-17 ENCOUNTER — Other Ambulatory Visit: Payer: Self-pay | Admitting: Oncology

## 2014-10-18 ENCOUNTER — Telehealth: Payer: Self-pay | Admitting: Oncology

## 2014-10-18 ENCOUNTER — Ambulatory Visit: Payer: BC Managed Care – PPO | Admitting: Oncology

## 2014-10-18 ENCOUNTER — Other Ambulatory Visit: Payer: BC Managed Care – PPO

## 2014-10-19 ENCOUNTER — Other Ambulatory Visit: Payer: BC Managed Care – PPO

## 2014-10-19 ENCOUNTER — Ambulatory Visit: Payer: BC Managed Care – PPO | Admitting: Oncology

## 2014-10-30 ENCOUNTER — Ambulatory Visit: Payer: BC Managed Care – PPO | Admitting: Oncology

## 2014-10-30 ENCOUNTER — Other Ambulatory Visit: Payer: BC Managed Care – PPO

## 2014-11-02 ENCOUNTER — Other Ambulatory Visit (INDEPENDENT_AMBULATORY_CARE_PROVIDER_SITE_OTHER): Payer: BC Managed Care – PPO | Admitting: *Deleted

## 2014-11-02 ENCOUNTER — Telehealth: Payer: Self-pay | Admitting: *Deleted

## 2014-11-02 DIAGNOSIS — E782 Mixed hyperlipidemia: Secondary | ICD-10-CM

## 2014-11-02 LAB — HEPATIC FUNCTION PANEL
ALK PHOS: 63 U/L (ref 39–117)
ALT: 21 U/L (ref 0–35)
AST: 23 U/L (ref 0–37)
Albumin: 4.2 g/dL (ref 3.5–5.2)
BILIRUBIN DIRECT: 0 mg/dL (ref 0.0–0.3)
BILIRUBIN TOTAL: 0.3 mg/dL (ref 0.2–1.2)
Total Protein: 7.1 g/dL (ref 6.0–8.3)

## 2014-11-02 LAB — LIPID PANEL
CHOLESTEROL: 108 mg/dL (ref 0–200)
HDL: 45.9 mg/dL (ref >39.00)
LDL CALC: 46 mg/dL (ref 0–99)
NONHDL: 62.1
Total CHOL/HDL Ratio: 2
Triglycerides: 82 mg/dL (ref 0.0–149.0)
VLDL: 16.4 mg/dL (ref 0.0–40.0)

## 2014-11-02 NOTE — Telephone Encounter (Signed)
Received Dr. Darrel Hoover office note from 11/02/14. Copy given to Dr. Marko Plume to review and original sent to HIM to be scanned into patient's chart.

## 2014-11-03 ENCOUNTER — Ambulatory Visit (INDEPENDENT_AMBULATORY_CARE_PROVIDER_SITE_OTHER): Payer: BC Managed Care – PPO | Admitting: Interventional Cardiology

## 2014-11-03 ENCOUNTER — Encounter: Payer: Self-pay | Admitting: Interventional Cardiology

## 2014-11-03 VITALS — BP 122/68 | HR 61 | Ht 65.0 in | Wt 147.4 lb

## 2014-11-03 DIAGNOSIS — E782 Mixed hyperlipidemia: Secondary | ICD-10-CM

## 2014-11-03 DIAGNOSIS — Z720 Tobacco use: Secondary | ICD-10-CM

## 2014-11-03 DIAGNOSIS — F17200 Nicotine dependence, unspecified, uncomplicated: Secondary | ICD-10-CM

## 2014-11-03 DIAGNOSIS — I2510 Atherosclerotic heart disease of native coronary artery without angina pectoris: Secondary | ICD-10-CM

## 2014-11-03 DIAGNOSIS — I252 Old myocardial infarction: Secondary | ICD-10-CM

## 2014-11-03 MED ORDER — ATENOLOL 25 MG PO TABS: 25.0000 mg | ORAL_TABLET | Freq: Every day | ORAL | Status: DC

## 2014-11-03 MED ORDER — LISINOPRIL 5 MG PO TABS: 5.0000 mg | ORAL_TABLET | Freq: Every day | ORAL | Status: DC

## 2014-11-03 MED ORDER — EZETIMIBE 10 MG PO TABS: 10.0000 mg | ORAL_TABLET | Freq: Every day | ORAL | Status: DC

## 2014-11-03 MED ORDER — ROSUVASTATIN CALCIUM 20 MG PO TABS: 20.0000 mg | ORAL_TABLET | Freq: Every day | ORAL | Status: DC

## 2014-11-03 NOTE — Progress Notes (Signed)
Patient ID: Amber [name], female   DOB: 03-08-1961, 54 y.o.   MRN: [id number]    [address] [place], [place]  [address] Phone: [phone] Fax:  [phone]  Date:  11/03/2014   ID:  Amber [name], DOB 1961-06-24, MRN [id number]  PCP:  Reginia Naas, MD      History of Present Illness: Amber [name] is a 54 y.o. female with a h/o anterior MI in 2001.  She had a stent placed by Dr. Leonia Reeves.  She had noted right calf pain. Improved on Crestor instead of lipitor. No nonhealing ulcers., SHe has plantar fasciitis but this has improved. SHe had an MI in 2001. She received a stent at that time. At the time of her heart attack, she had nausea, diaphoresis and chest pain. SHe knew it was a heart attack. She has not had anything like that recently.   Exercise: SHe does at least 60 minutes continuously. Hip repair is holding up. Zumba 3-4x/week.  Treadmill, bicycle, aerobics class.  No weight lifting for a while.  She injured her wrist after a fall.  She had  An operation on her wrist. CAD/ASCVD:  Denies : Chest pain.  Dizziness.  Leg edema.  Palpitations.  Paroxysmal nocturnal dyspnea.  Syncope.     Wt Readings from Last 3 Encounters:  11/03/14 147 lb 6.4 oz (66.86 kg)  08/18/14 147 lb 6 oz (66.849 kg)  08/09/14 146 lb 8 oz (66.452 kg)     Past Medical History  Diagnosis Date  . Hyperlipidemia   . Osteoporosis   . Hypertension     Dr. [facility]  . GERD (gastroesophageal reflux disease)     hx of   . Neuromuscular disorder     carpal tunnel right hand  . Chronic neck pain     hx of  . Hypothyroidism   . Heart attack 2001    Dr. Scarlette Calico  . Anginal pain 2001  . Pernicious anemia   . Sickle cell trait   . Migraines     "last one was 1990's" (08/17/2012)  . DJD (degenerative joint disease) of hip     bil hip  . Breast cancer     invasive ductal carcinoma right breast; ER, PR + and HER 2 -  . History of radiation  therapy 09/16/12- 10/19/12    right breast  . Tobacco abuse   . Allergic rhinitis   . Sleep apnea     does use a cpap  . Wears glasses     Current Outpatient Prescriptions  Medication Sig Dispense Refill  . alendronate (FOSAMAX) 70 MG tablet TAKE ONE TABLET BY MOUTH ONCE A WEEK ON  SATURDAY 4 tablet 0  . amoxicillin (AMOXIL) 500 MG capsule     . anastrozole (ARIMIDEX) 1 MG tablet TAKE ONE TABLET BY MOUTH ONCE DAILY 90 tablet 1  . aspirin 81 MG tablet Take 81 mg by mouth daily.    Marland Kitchen atenolol (TENORMIN) 25 MG tablet Take 1 tablet (25 mg total) by mouth daily. 90 tablet 1  . BIOTIN PO Take 2 tablets by mouth daily.    . CHANTIX 1 MG tablet Take 1 mg by mouth 2 (two) times daily.     . Cholecalciferol (VITAMIN D PO) Take 1 tablet by mouth daily.    . Coenzyme Q10 (COQ10 PO) Take 1 tablet by mouth daily.    . cyanocobalamin (,VITAMIN B-12,) 1000 MCG/ML injection Inject 1,000 mcg  into the muscle every 30 (thirty) days. As needed when feeling tired    . docusate sodium 100 MG CAPS Take 100 mg by mouth 2 (two) times daily. 30 capsule 1  . GLUCOSAMINE-CHONDROITIN PO Take 1 tablet by mouth daily.    Marland Kitchen HYDROcodone-acetaminophen (NORCO) 5-325 MG per tablet Take 1 tablet by mouth every 6 (six) hours as needed for moderate pain. 30 tablet 0  . IRON PO Take 1 tablet by mouth at bedtime as needed. When feeling tired    . levothyroxine (SYNTHROID, LEVOTHROID) 88 MCG tablet     . lisinopril (PRINIVIL,ZESTRIL) 5 MG tablet Take 1 tablet (5 mg total) by mouth daily. 90 tablet 1  . mometasone (NASONEX) 50 MCG/ACT nasal spray Place 2 sprays into the nose daily as needed. For allergies    . nitroGLYCERIN (NITROSTAT) 0.4 MG SL tablet Place 0.4 mg under the tongue every 5 (five) minutes as needed for chest pain.    . Omega-3 Fatty Acids (FISH OIL PO) Take 1 capsule by mouth daily.    . penicillin v potassium (VEETID) 500 MG tablet Per pt to take before dental appt and after    . promethazine (PHENERGAN) 25 MG  tablet Take 1 tablet (25 mg total) by mouth every 6 (six) hours as needed for nausea. 30 tablet 0  . rosuvastatin (CRESTOR) 20 MG tablet Take 1 tablet (20 mg total) by mouth daily. 30 tablet 1  . triamcinolone (KENALOG) 0.1 % paste     . valACYclovir (VALTREX) 500 MG tablet Take 500 mg by mouth as needed (Cold sore).     Marland Kitchen ZETIA 10 MG tablet TAKE ONE TABLET BY MOUTH ONCE DAILY 30 tablet 5  . zolpidem (AMBIEN) 10 MG tablet      No current facility-administered medications for this visit.    Allergies:    Allergies  Allergen Reactions  . Betadine [Povidone Iodine] Rash  . Tramadol Nausea And Vomiting and Other (See Comments)    "couldn't stop vomiting" (08/17/2012)  . Benzoyl Peroxide Itching and Rash    Social History:  The patient  reports that she has been smoking Cigarettes.  She has a 17.5 pack-year smoking history. She has never used smokeless tobacco. She reports that she drinks alcohol. She reports that she does not use illicit drugs.   Family History:  The patient's family history includes Cancer in her cousin and maternal grandmother; Diabetes in her mother; Heart disease in her mother; Parkinson's disease in her maternal grandfather.   ROS:  Please see the history of present illness.  No nausea, vomiting.  No fevers, chills.  No focal weakness.  No dysuria. Hip is holding up; may need other hip operated upon.   All other systems reviewed and negative.   PHYSICAL EXAM: VS:  BP 122/68 mmHg  Pulse 61  Ht 5\' 5"  (1.651 m)  Wt 147 lb 6.4 oz (66.86 kg)  BMI 24.53 kg/m2 Well nourished, well developed, in no acute distress HEENT: normal Neck: no JVD, no carotid bruits Cardiac:  normal S1, S2; RRR;  Lungs:  clear to auscultation bilaterally, no wheezing, rhonchi or rales Abd: soft, nontender, no hepatomegaly Ext: no edema Skin: warm and dry Neuro:   no focal abnormalities noted Psych: normal affect  EKG:  NSR, NSST  ASSESSMENT AND PLAN:  Coronary atherosclerosis of native  coronary artery  Continue Aspir-81 Tablet Delayed Release, 81 MG, 1 tablet, Orally, Once a day Notes: No angina. Continue regular exercise. S/p anterior MI with stent  placement in 2001. No CHF. No bleeding issues.  No cardiac issues during surgery in 12/15.  Continue regular exercise. No signs of CHF.  2. Combined hyperlipidemia  Refill Zetia Tablet, 10 MG, 1 tablet, Once a day, 30 days, 30, Refills 11 Refill Crestor Tablet, 20 MG, 1 tablet, Orally, Once a day, 30 days, 30, Refills 3 Notes: LDL 43 in 2015. Leg pain better on Crestor. LDL in 2016 46.  TG 82.   3. Current smoker  Notes: She needs to stop smoking. She is back on Chantix, and trying to stop smoking.   She is still smoking while taking Chantix.  Encouraged her to try to stop smoking as she should have already stopped based on the Chantix prescription.     4. Hypertension, essential  Refill Atenolol Tablet, 25 MG, 1 tablet, Once a day, 90 days, 90, Refills 3 Refill Lisinopril Tablet, 5 MG, 1 tablet, Once a day, 90 days, 90, Refills 3 Notes: Controlled.  Refill medications.   Signed, Mina Marble, MD, [facility] 11/03/2014 9:13 AM

## 2014-11-03 NOTE — Patient Instructions (Signed)
Your physician recommends that you return for FASTING lab work in: 1 year- lipid/liver  Your physician wants you to follow-up in: 1 year with Dr. Glennon Hamilton will receive a reminder letter in the mail two months in advance. If you don't receive a letter, please call our office to schedule the follow-up appointment.

## 2014-11-06 ENCOUNTER — Telehealth: Payer: Self-pay | Admitting: Oncology

## 2014-11-06 NOTE — Telephone Encounter (Signed)
Called and lm with new appt time per dr ll for chemo pt  anne

## 2014-11-07 ENCOUNTER — Other Ambulatory Visit: Payer: Self-pay | Admitting: Oncology

## 2014-11-07 DIAGNOSIS — C50311 Malignant neoplasm of lower-inner quadrant of right female breast: Secondary | ICD-10-CM

## 2014-11-10 ENCOUNTER — Telehealth: Payer: Self-pay | Admitting: Interventional Cardiology

## 2014-11-10 ENCOUNTER — Ambulatory Visit: Payer: BC Managed Care – PPO | Admitting: Neurology

## 2014-11-10 NOTE — Telephone Encounter (Signed)
New Msg         Pt returning call from today, please call back.

## 2014-11-10 NOTE — Telephone Encounter (Signed)
Returned pt's call. Had called and left voicemail for pt to call back in regards to lab results. Informed pt of lab results. Pt verbalized understanding.

## 2014-11-13 ENCOUNTER — Encounter: Payer: Self-pay | Admitting: Oncology

## 2014-11-13 ENCOUNTER — Other Ambulatory Visit (HOSPITAL_BASED_OUTPATIENT_CLINIC_OR_DEPARTMENT_OTHER): Payer: BC Managed Care – PPO

## 2014-11-13 ENCOUNTER — Telehealth: Payer: Self-pay | Admitting: Oncology

## 2014-11-13 ENCOUNTER — Other Ambulatory Visit: Payer: BC Managed Care – PPO

## 2014-11-13 ENCOUNTER — Ambulatory Visit (HOSPITAL_BASED_OUTPATIENT_CLINIC_OR_DEPARTMENT_OTHER): Payer: BC Managed Care – PPO | Admitting: Oncology

## 2014-11-13 ENCOUNTER — Ambulatory Visit: Payer: BC Managed Care – PPO | Admitting: Oncology

## 2014-11-13 VITALS — BP 116/58 | HR 57 | Temp 98.5°F | Resp 18 | Ht 65.0 in | Wt 144.9 lb

## 2014-11-13 DIAGNOSIS — M8580 Other specified disorders of bone density and structure, unspecified site: Secondary | ICD-10-CM

## 2014-11-13 DIAGNOSIS — C50311 Malignant neoplasm of lower-inner quadrant of right female breast: Secondary | ICD-10-CM

## 2014-11-13 DIAGNOSIS — E559 Vitamin D deficiency, unspecified: Secondary | ICD-10-CM

## 2014-11-13 DIAGNOSIS — Z720 Tobacco use: Secondary | ICD-10-CM

## 2014-11-13 DIAGNOSIS — Z170 Estrogen receptor positive status [ER+]: Secondary | ICD-10-CM

## 2014-11-13 DIAGNOSIS — Z79899 Other long term (current) drug therapy: Secondary | ICD-10-CM

## 2014-11-13 DIAGNOSIS — C50319 Malignant neoplasm of lower-inner quadrant of unspecified female breast: Secondary | ICD-10-CM

## 2014-11-13 LAB — CBC WITH DIFFERENTIAL/PLATELET
BASO%: 0.2 % (ref 0.0–2.0)
Basophils Absolute: 0 10*3/uL (ref 0.0–0.1)
EOS%: 3.4 % (ref 0.0–7.0)
Eosinophils Absolute: 0.3 10*3/uL (ref 0.0–0.5)
HCT: 38.8 % (ref 34.8–46.6)
HEMOGLOBIN: 13.3 g/dL (ref 11.6–15.9)
LYMPH#: 2.3 10*3/uL (ref 0.9–3.3)
LYMPH%: 26.8 % (ref 14.0–49.7)
MCH: 29.8 pg (ref 25.1–34.0)
MCHC: 34.3 g/dL (ref 31.5–36.0)
MCV: 87 fL (ref 79.5–101.0)
MONO#: 0.7 10*3/uL (ref 0.1–0.9)
MONO%: 7.7 % (ref 0.0–14.0)
NEUT#: 5.4 10*3/uL (ref 1.5–6.5)
NEUT%: 61.9 % (ref 38.4–76.8)
Platelets: 201 10*3/uL (ref 145–400)
RBC: 4.46 10*6/uL (ref 3.70–5.45)
RDW: 15.3 % — ABNORMAL HIGH (ref 11.2–14.5)
WBC: 8.7 10*3/uL (ref 3.9–10.3)

## 2014-11-13 LAB — COMPREHENSIVE METABOLIC PANEL (CC13)
ALBUMIN: 3.9 g/dL (ref 3.5–5.0)
ALK PHOS: 72 U/L (ref 40–150)
ALT: 36 U/L (ref 0–55)
ANION GAP: 9 meq/L (ref 3–11)
AST: 34 U/L (ref 5–34)
BILIRUBIN TOTAL: 0.26 mg/dL (ref 0.20–1.20)
BUN: 7 mg/dL (ref 7.0–26.0)
CO2: 24 meq/L (ref 22–29)
Calcium: 9.5 mg/dL (ref 8.4–10.4)
Chloride: 107 mEq/L (ref 98–109)
Creatinine: 0.8 mg/dL (ref 0.6–1.1)
EGFR: >90 mL/min/{1.73_m2} (ref >90)
Glucose: 129 mg/dl (ref 70–140)
POTASSIUM: 4.3 meq/L (ref 3.5–5.1)
SODIUM: 140 meq/L (ref 136–145)
TOTAL PROTEIN: 7 g/dL (ref 6.4–8.3)

## 2014-11-13 MED ORDER — ANASTROZOLE 1 MG PO TABS: 1.0000 mg | ORAL_TABLET | Freq: Every day | ORAL | Status: DC

## 2014-11-13 NOTE — Telephone Encounter (Signed)
appts made and avs printed for pt  Amber [name] °

## 2014-11-13 NOTE — Progress Notes (Signed)
OFFICE PROGRESS NOTE   November 13, 2014   Physicians:( K.Khan), [name] (PCP), H.Dalbert Batman, J.Varanasi, _Evelyn Varnado (gyn, [place]), S.Rogelio Seen, GI [place], Metta Clines  INTERVAL HISTORY:   Patient is seen, alone for visit, in scheduled follow up of adjuvant arimidex, which she has used since 11-04-2012 for stage 1 right breast cancer. She is tolerating the arimidex generally well, tho had osteopenia on last bone density scan at [facility] 11-30-2012. Last bilateral tomo diagnostic mammograms were at [facility] 06-26-14, with heterogeneously dense breast tissue but otherwise no mammographic findings of concern.  She is now on prn follow up with Dr Dalbert Batman, having seen him last 11-02-2014 (that note reviewed by MD and will be scanned into this EMR). Patient was unable to have bone density scan done when she went for mammograms 06-2014, which sounds to have been a scheduling issue. Due to low bone density and use of high risk aromatase inhibitor, it is medically appropriate to have yearly bone density scan, however especially with her work schedule she prefers not to reschedule this now, does want the bone density scan when she has next mammograms in 06-2015.  Patient has difficult work schedule, traveling [place] thru Fri, home on weekends now. She is not working out as regularly and we have discussed importance of weight bearing exercise. She denies arthralgia symptoms or severe hot flashes. She is not aware of any changes in breasts bilaterally, tho she has irregular areas in right breast since that treatment.   Patient continues to smoke, tho <10 cigarettes daily now, helped by fact that she cannot smoke in office or in the hotel. She has just resumed Chantix, which was useful previously. We have discussed smoking cessation strategies and I have encouraged her to continue to address.     ONCOLOGIC HISTORY  Review of systems as above, also: Left wrist  tenosynovitis with release procedure by Dr Rhona Raider [phone], improved. MRI brain for right sided HA, no findings of concern and thought to be sinus related, improved. Good appetite. Denies SOB or cough. No GI symptoms. No bleeding.  Remainder of 10 point Review of Systems negative.  Objective:  Vital signs in last 24 hours:  BP 116/58 mmHg  Pulse 57  Temp(Src) 98.5 F (36.9 C) (Oral)  Resp 18  Ht 5' 5"  (1.651 m)  Wt 144 lb 14.4 oz (65.726 kg)  BMI 24.11 kg/m2  SpO2 100% Weight down 2 lbs. Alert, oriented and appropriate. Ambulatory without difficulty.   HEENT:PERRL, sclerae not icteric. Oral mucosa moist without lesions, posterior pharynx clear.  Neck supple. No JVD.  Lymphatics:no cervical,supraclavicular, axillary adenopathy Resp: clear to auscultation bilaterally and no dullness to percussion bilaterally Cardio: regular rate and rhythm. No gallop. GI: soft, nontender, not distended, no mass or organomegaly. Normally active bowel sounds.  Musculoskeletal/ Extremities: without pitting edema, cords, tenderness Neuro: no peripheral neuropathy. Otherwise nonfocal. Psych appropriate mood and affect Skin without rash, ecchymosis, petechiae Breasts: right central transverse partial mastectomy scar well healed, absent nipple areola complex. Thickened areas laterally and  deep to scar as previously, up to ~ 2.5 cm, not tender, no overlying skin changes. Left breast without dominant mass, skin or nipple findings. Axillae benign.   Lab Results:  Results for orders placed or performed in visit on 11/13/14  CBC with Differential  Result Value Ref Range   WBC 8.7 3.9 - 10.3 10e3/uL   NEUT# 5.4 1.5 - 6.5 10e3/uL   HGB 13.3 11.6 - 15.9 g/dL  HCT 38.8 34.8 - 46.6 %   Platelets 201 145 - 400 10e3/uL   MCV 87.0 79.5 - 101.0 fL   MCH 29.8 25.1 - 34.0 pg   MCHC 34.3 31.5 - 36.0 g/dL   RBC 4.46 3.70 - 5.45 10e6/uL   RDW 15.3 (H) 11.2 - 14.5 %   lymph# 2.3 0.9 - 3.3 10e3/uL   MONO# 0.7 0.1  - 0.9 10e3/uL   Eosinophils Absolute 0.3 0.0 - 0.5 10e3/uL   Basophils Absolute 0.0 0.0 - 0.1 10e3/uL   NEUT% 61.9 38.4 - 76.8 %   LYMPH% 26.8 14.0 - 49.7 %   MONO% 7.7 0.0 - 14.0 %   EOS% 3.4 0.0 - 7.0 %   BASO% 0.2 0.0 - 2.0 %  Comprehensive metabolic panel (Cmet) - [facility]  Result Value Ref Range   Sodium 140 136 - 145 mEq/L   Potassium 4.3 3.5 - 5.1 mEq/L   Chloride 107 98 - 109 mEq/L   CO2 24 22 - 29 mEq/L   Glucose 129 70 - 140 mg/dl   BUN 7.0 7.0 - 26.0 mg/dL   Creatinine 0.8 0.6 - 1.1 mg/dL   Total Bilirubin 0.26 0.20 - 1.20 mg/dL   Alkaline Phosphatase 72 40 - 150 U/L   AST 34 5 - 34 U/L   ALT 36 0 - 55 U/L   Total Protein 7.0 6.4 - 8.3 g/dL   Albumin 3.9 3.5 - 5.0 g/dL   Calcium 9.5 8.4 - 10.4 mg/dL   Anion Gap 9 3 - 11 mEq/L   EGFR >90 >90 ml/min/1.73 m2     Studies/Results: EXAM: DIGITAL DIAGNOSTIC BILATERAL MAMMOGRAM WITH 3D TOMOSYNTHESIS AND CAD  COMPARISON: 06/24/2013, additional prior imaging dating back to 06/16/2008  ACR Breast Density Category c: The breast tissue is heterogeneously dense, which may obscure small masses.  FINDINGS: Postlumpectomy changes are seen within the central right breast. No suspicious mass, calcifications, or other abnormality is identified within either breast.  Mammographic images were processed with CAD.  IMPRESSION: No mammographic evidence of malignancy.  RECOMMENDATION: Bilateral diagnostic mammogram in 1 year.   Medications: I have reviewed the patient's current medications. She continues Vit D  DISCUSSION:  Timing of bone density scan and smoking cessation as above. She is in agreement with next visit at this office just after mammograms and bone density scans this fall.   She is not very forthcoming with conversation, but seems to be mostly under stress related to travel with job, as she seemed more comfortable after I acknowledged that issue.  Assessment/Plan: 1.Stage 1 (T1bN0) grade 1 ER PR + HER 2  negative invasive ductal carcinoma of central right breast: post central lumpectomy with sentinel node 06-2012, local radiation and on Arimidex since 11-04-2012. For bilateral mammograms 06-2015 at [facility]. Follow up medical oncology 6 months or sooner if concerns. Should have lipid panel by PCP or cardiology 2.osteopenia by bone density scan 11-30-2012. It is medically necessary for yearly bone density scans while on high risk aromatase inhibitor, however this was not done due to scheduling issue in fall 2015 and with work schedule she prefers to wait now to coordinate this with next mammogram. Should check Vit D level with next labs here if not done in past year by PCP or other.Needs to resume regular weight bearing exercise. 3.degenerative arthritis hips:post right hip replacement 4.post MI 2001, HTN, elevated lipids. Saw Dr Irish Lack 11-03-2014 5.hx GERD, no complaints of this now 6.ongoing tobacco: has started Chantix/ not yet  off cigarettes. As above.  7.lett wrist tenosynovitis improved with release surgery by Dr Rhona Raider 8.headaches resolved, apparently sinus related. 9.hypothyroidism 10.sickle trait and use of B12 supplement  All questions answered. Patient understands and agrees with plans and recommendations as noted. Time spent 25 min including >50% counseling and coordination of care.  Cc Dr [name] and Dr Sydnee Levans, MD   11/13/2014, 1:13 PM

## 2015-05-15 IMAGING — CT CT HEAD W/O CM
2 series · 16 of 30 positions shown, 20 images · non-contrast
Comparison: None.

CLINICAL DATA: Right-sided headaches

EXAM:
CT HEAD WITHOUT CONTRAST
TECHNIQUE: Contiguous axial images were obtained from the base of the skull
through the vertex without intravenous contrast.

[Series 3: head bone · axial · 0.49mm/px · z∈[-4,+36]mm · 3 of 28 slices shown]
[im 2/28  bone]
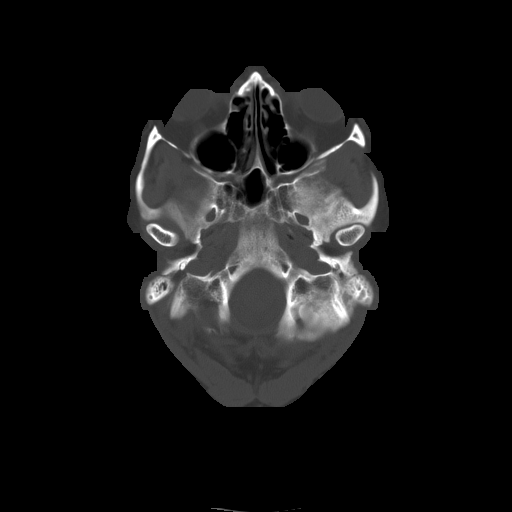
[im 6/28  bone]
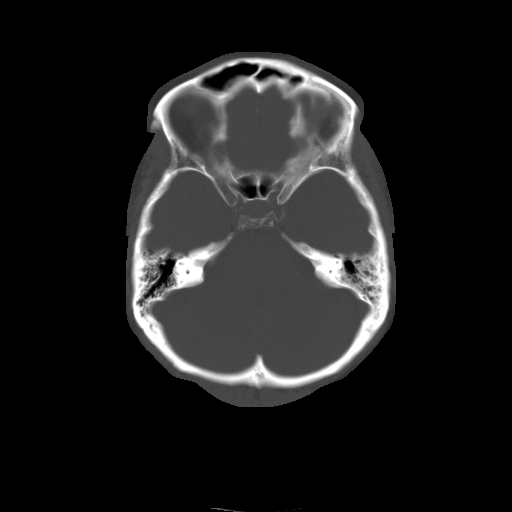
[im 10/28  bone]
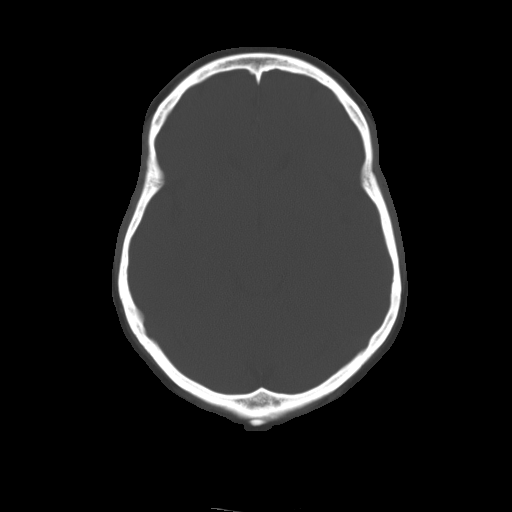

[Series 32: 3d filtered head w/o · axial · non-contrast · 0.49mm/px · z∈[-4,+117]mm · 13 of 28 slices shown, 17 images]
[im 2/28  brain]
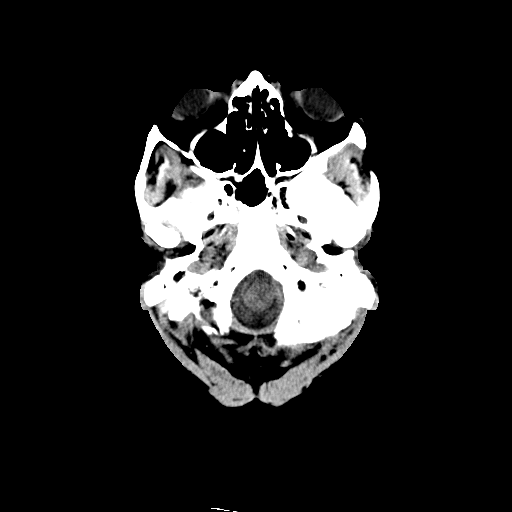
[im 2/28  bone]
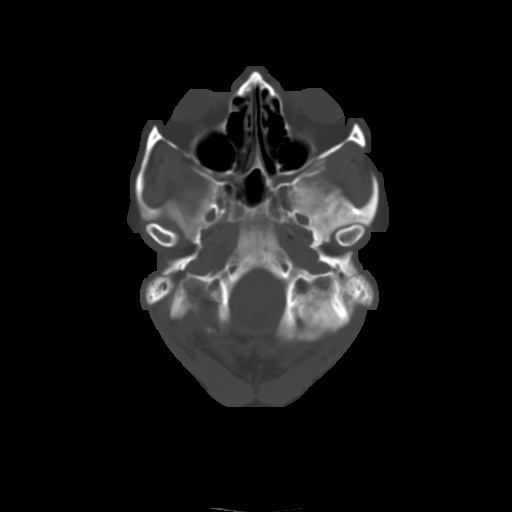
[im 4/28  brain]
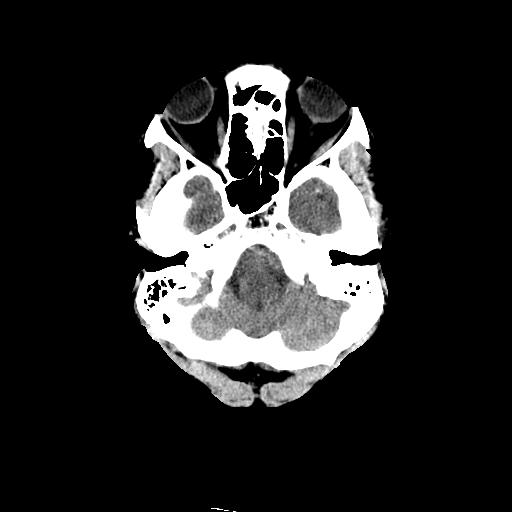
[im 6/28  brain]
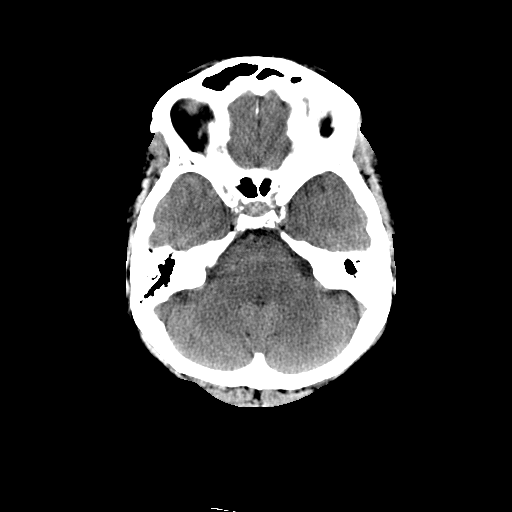
[im 8/28  brain]
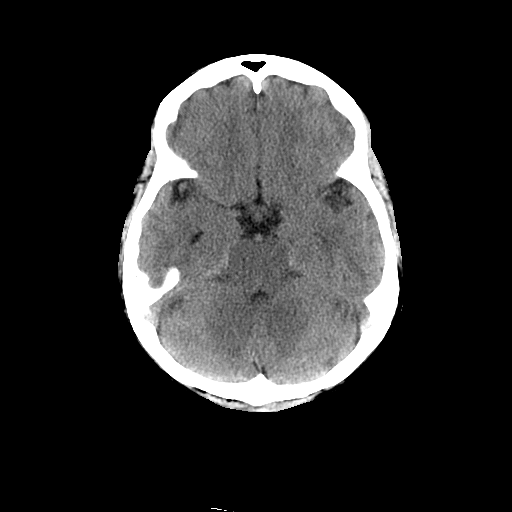
[im 10/28  brain]
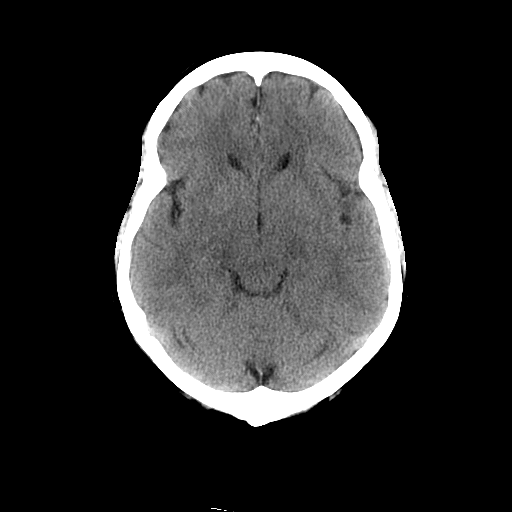
[im 10/28  bone]
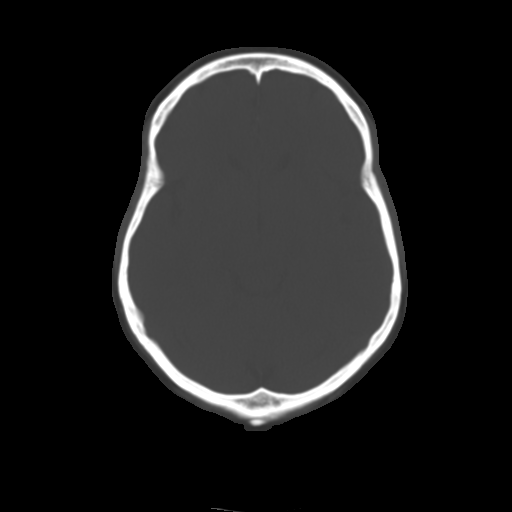
[im 12/28  brain]
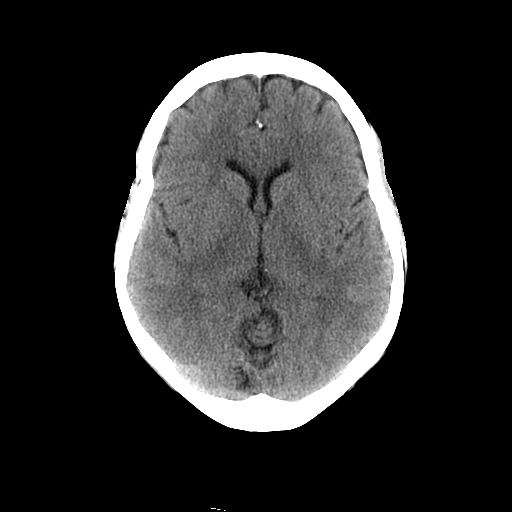
[im 14/28  brain]
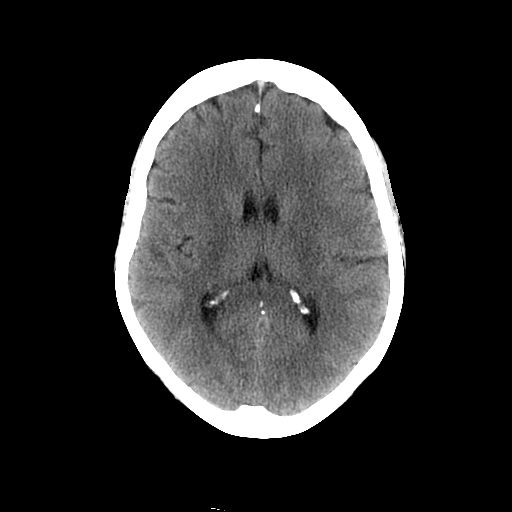
[im 16/28  brain]
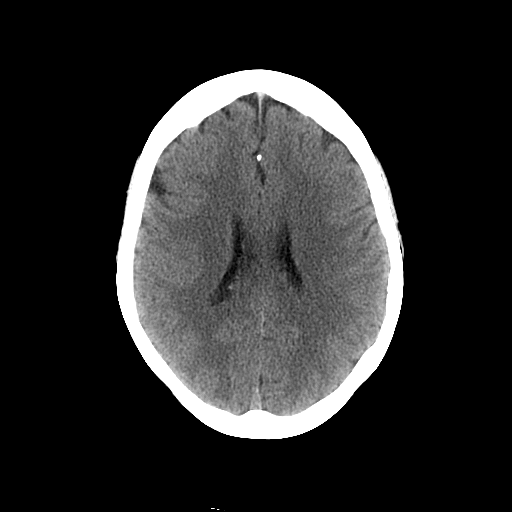
[im 18/28  brain]
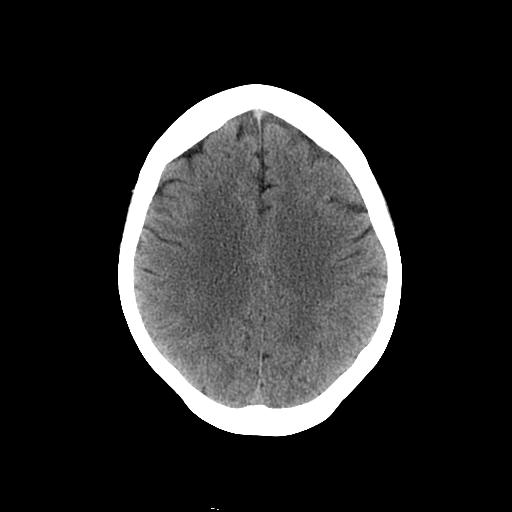
[im 18/28  bone]
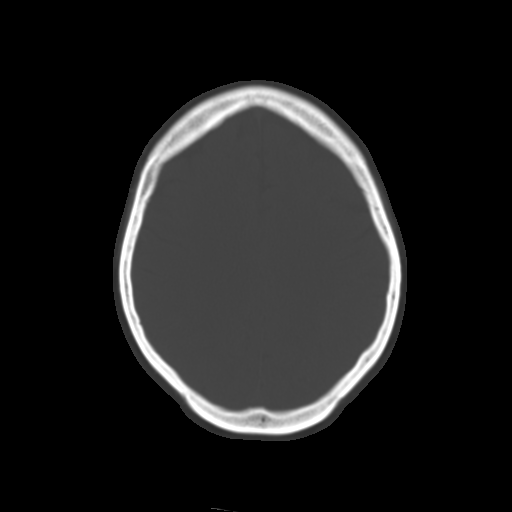
[im 20/28  brain]
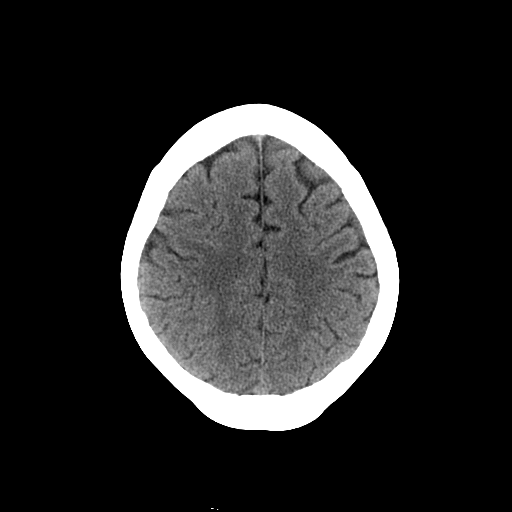
[im 22/28  brain]
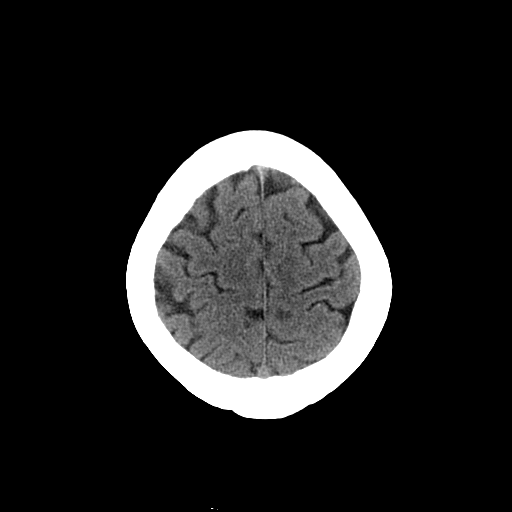
[im 24/28  brain]
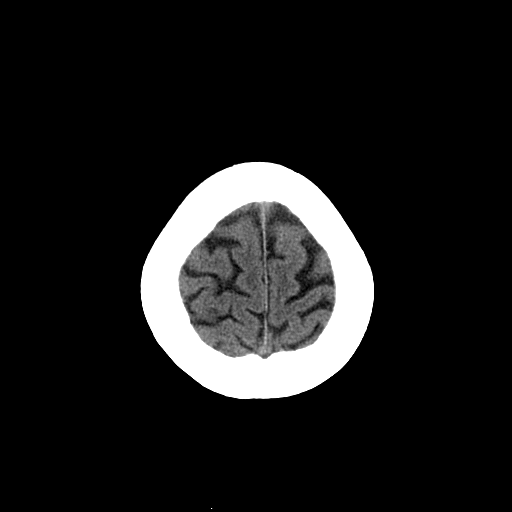
[im 26/28  brain]
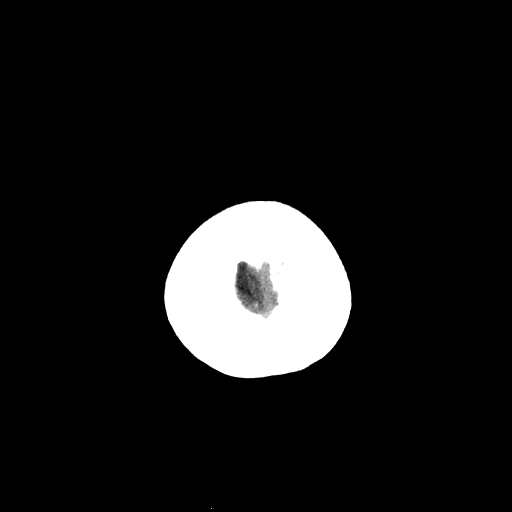
[im 26/28  bone]
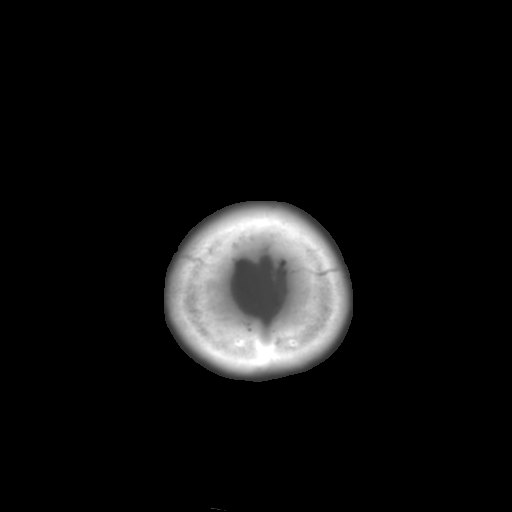

[16 of 30 positions shown; findings below may reference images not displayed]

FINDINGS: Bony calvarium is intact. No gross soft tissue abnormality is noted.
No findings to suggest acute hemorrhage, acute infarction or
space-occupying mass lesion are noted.
IMPRESSION: No acute abnormality noted.

## 2015-06-28 ENCOUNTER — Other Ambulatory Visit: Payer: Self-pay | Admitting: Oncology

## 2015-06-28 ENCOUNTER — Ambulatory Visit
Admission: RE | Admit: 2015-06-28 | Discharge: 2015-06-28 | Disposition: A | Discharge status: Home / Self Care | Payer: BC Managed Care – PPO | Source: Ambulatory Visit | Attending: Oncology | Admitting: Oncology

## 2015-06-28 DIAGNOSIS — C50319 Malignant neoplasm of lower-inner quadrant of unspecified female breast: Secondary | ICD-10-CM

## 2015-06-28 DIAGNOSIS — Z79899 Other long term (current) drug therapy: Secondary | ICD-10-CM

## 2015-06-28 DIAGNOSIS — M8580 Other specified disorders of bone density and structure, unspecified site: Secondary | ICD-10-CM

## 2015-07-01 ENCOUNTER — Other Ambulatory Visit: Payer: Self-pay | Admitting: Oncology

## 2015-07-04 ENCOUNTER — Telehealth: Payer: Self-pay | Admitting: Oncology

## 2015-07-04 NOTE — Telephone Encounter (Signed)
Returned call to patient re r/s 11/3 appointments. Gave patient new appointment for 12/12.

## 2015-07-05 ENCOUNTER — Other Ambulatory Visit: Payer: BC Managed Care – PPO

## 2015-07-05 ENCOUNTER — Ambulatory Visit: Payer: BC Managed Care – PPO | Admitting: Oncology

## 2015-08-03 ENCOUNTER — Other Ambulatory Visit: Payer: BC Managed Care – PPO

## 2015-08-07 ENCOUNTER — Other Ambulatory Visit: Payer: Self-pay | Admitting: Orthopaedic Surgery

## 2015-08-10 ENCOUNTER — Encounter (HOSPITAL_COMMUNITY)
Admission: RE | Admit: 2015-08-10 | Discharge: 2015-08-10 | Disposition: A | Discharge status: Home / Self Care | Payer: BC Managed Care – PPO | Source: Ambulatory Visit | Attending: Orthopaedic Surgery | Admitting: Orthopaedic Surgery

## 2015-08-10 ENCOUNTER — Ambulatory Visit (HOSPITAL_COMMUNITY)
Admission: RE | Admit: 2015-08-10 | Discharge: 2015-08-10 | Disposition: A | Discharge status: Home / Self Care | Payer: BC Managed Care – PPO | Source: Ambulatory Visit | Attending: Orthopaedic Surgery | Admitting: Orthopaedic Surgery

## 2015-08-10 ENCOUNTER — Encounter (HOSPITAL_COMMUNITY): Payer: Self-pay

## 2015-08-10 DIAGNOSIS — I10 Essential (primary) hypertension: Secondary | ICD-10-CM | POA: Insufficient documentation

## 2015-08-10 DIAGNOSIS — Z01818 Encounter for other preprocedural examination: Secondary | ICD-10-CM

## 2015-08-10 DIAGNOSIS — Z853 Personal history of malignant neoplasm of breast: Secondary | ICD-10-CM | POA: Insufficient documentation

## 2015-08-10 DIAGNOSIS — Z01810 Encounter for preprocedural cardiovascular examination: Secondary | ICD-10-CM | POA: Insufficient documentation

## 2015-08-10 DIAGNOSIS — Z01812 Encounter for preprocedural laboratory examination: Secondary | ICD-10-CM | POA: Diagnosis not present

## 2015-08-10 LAB — APTT: APTT: 25 s (ref 24–37)

## 2015-08-10 LAB — PROTIME-INR
INR: 1.03 (ref 0.00–1.49)
Prothrombin Time: 13.7 seconds (ref 11.6–15.2)

## 2015-08-10 LAB — CBC WITH DIFFERENTIAL/PLATELET
BASOS ABS: 0 10*3/uL (ref 0.0–0.1)
Basophils Relative: 0 %
EOS PCT: 3 %
Eosinophils Absolute: 0.3 10*3/uL (ref 0.0–0.7)
HCT: 41.2 % (ref 36.0–46.0)
HEMOGLOBIN: 13.9 g/dL (ref 12.0–15.0)
LYMPHS PCT: 25 %
Lymphs Abs: 2.3 10*3/uL (ref 0.7–4.0)
MCH: 29.4 pg (ref 26.0–34.0)
MCHC: 33.7 g/dL (ref 30.0–36.0)
MCV: 87.3 fL (ref 78.0–100.0)
Monocytes Absolute: 0.6 10*3/uL (ref 0.1–1.0)
Monocytes Relative: 7 %
NEUTROS ABS: 5.7 10*3/uL (ref 1.7–7.7)
NEUTROS PCT: 65 %
PLATELETS: 214 10*3/uL (ref 150–400)
RBC: 4.72 MIL/uL (ref 3.87–5.11)
RDW: 15.5 % (ref 11.5–15.5)
WBC: 8.9 10*3/uL (ref 4.0–10.5)

## 2015-08-10 LAB — URINALYSIS, ROUTINE W REFLEX MICROSCOPIC
Bilirubin Urine: NEGATIVE
GLUCOSE, UA: NEGATIVE mg/dL
KETONES UR: NEGATIVE mg/dL
Leukocytes, UA: NEGATIVE
NITRITE: NEGATIVE
PH: 6 (ref 5.0–8.0)
Protein, ur: NEGATIVE mg/dL
Specific Gravity, Urine: 1.007 (ref 1.005–1.030)

## 2015-08-10 LAB — BASIC METABOLIC PANEL
ANION GAP: 9 (ref 5–15)
BUN: 6 mg/dL (ref 6–20)
CHLORIDE: 107 mmol/L (ref 101–111)
CO2: 25 mmol/L (ref 22–32)
Calcium: 10.4 mg/dL — ABNORMAL HIGH (ref 8.9–10.3)
Creatinine, Ser: 0.72 mg/dL (ref 0.44–1.00)
Glucose, Bld: 104 mg/dL — ABNORMAL HIGH (ref 65–99)
POTASSIUM: 4.4 mmol/L (ref 3.5–5.1)
SODIUM: 141 mmol/L (ref 135–145)

## 2015-08-10 LAB — URINE MICROSCOPIC-ADD ON

## 2015-08-10 LAB — SURGICAL PCR SCREEN
MRSA, PCR: NEGATIVE
Staphylococcus aureus: NEGATIVE

## 2015-08-10 NOTE — Progress Notes (Signed)
SPOKE WITH TRACY AT DR. DALLDORF'S OFFICE ASKING IF PATIENT NEEDED TO STOP BABY ASPIRIN.  TRACY STATED SHE WOULD SEND THE MESSAGE.

## 2015-08-10 NOTE — Pre-Procedure Instructions (Addendum)
Amber [name]  08/10/2015      [facility] - [place], [place] - [address] [place] [address] Phone: [phone] Fax: [phone]    Your procedure is scheduled on  Tuesday  08/21/15  Report to [facility] Admitting at 530 A.M.  Call this number if you have problems the morning of surgery:  [phone]   Remember:  Do not eat food or drink liquids after midnight.  Take these medicines the morning of surgery with A SIP OF WATER   ATENOLOL (TENORMIN), LEVOTHYROXINE ( OMEGA 3 FISH OIL, CO Q 10, HERBAL MEDICINES, GOODY POWDERS, B/C S)   Do not wear jewelry, make-up or nail polish.  Do not wear lotions, powders, or perfumes.  You may wear deodorant.  Do not shave 48 hours prior to surgery.  Men may shave face and neck.  Do not bring valuables to the hospital.  [facility] is not responsible for any belongings or valuables.  Contacts, dentures or bridgework may not be worn into surgery.  Leave your suitcase in the car.  After surgery it may be brought to your room.  For patients admitted to the hospital, discharge time will be determined by your treatment team.  Patients discharged the day of surgery will not be allowed to drive home.   Name and phone number of your driver:   Special instructions:  [place] - Preparing for Surgery  Before surgery, you can play an important role.  Because skin is not sterile, your skin needs to be as free of germs as possible.  You can reduce the number of germs on you skin by washing with CHG (chlorahexidine gluconate) soap before surgery.  CHG is an antiseptic cleaner which kills germs and bonds with the skin to continue killing germs even after washing.  Please DO NOT use if you have an allergy to CHG or antibacterial soaps.  If your skin becomes reddened/irritated stop using the CHG and inform your nurse when you arrive at Short Stay.  Do not shave (including legs and underarms) for at  least 48 hours prior to the first CHG shower.  You may shave your face.  Please follow these instructions carefully:   1.  Shower with CHG Soap the night before surgery and the                                morning of Surgery.  2.  If you choose to wash your hair, wash your hair first as usual with your       normal shampoo.  3.  After you shampoo, rinse your hair and body thoroughly to remove the                      Shampoo.  4.  Use CHG as you would any other liquid soap.  You can apply chg directly       to the skin and wash gently with scrungie or a clean washcloth.  5.  Apply the CHG Soap to your body ONLY FROM THE NECK DOWN.        Do not use on open wounds or open sores.  Avoid contact with your eyes,       ears, mouth and genitals (private parts).  Wash genitals (private parts)       with your normal soap.  6.  Wash thoroughly, paying special attention to the area where your surgery        will be performed.  7.  Thoroughly rinse your body with warm water from the neck down.  8.  DO NOT shower/wash with your normal soap after using and rinsing off       the CHG Soap.  9.  Pat yourself dry with a clean towel.            10.  Wear clean pajamas.            11.  Place clean sheets on your bed the night of your first shower and do not        sleep with pets.  Day of Surgery  Do not apply any lotions/deoderants the morning of surgery.  Please wear clean clothes to the hospital/surgery center.    Please read over the following fact sheets that you were given. Pain Booklet, Coughing and Deep Breathing, Blood Transfusion Information, Total Joint Packet, MRSA Information and Surgical Site Infection Prevention

## 2015-08-10 NOTE — Progress Notes (Signed)
ANDREW PA WITH DR. DALLDORF CALLED AND STATED PATIENT COULD STOP ASPIRIN 1 WEEK PRIOR TO SURGERY.  HE WAS MADE AWARE OF PATIENT HAVING CARDIAC CATH + STENT 2001.  LEFT MESSAGE FOR PATIENT ON VOICE MAIL THAT SHE SHOULD STOP ASPIRIN 1 WEEK PRIOR TO SURGERY.

## 2015-08-11 LAB — TYPE AND SCREEN
ABO/RH(D): B POS
ANTIBODY SCREEN: NEGATIVE

## 2015-08-12 ENCOUNTER — Other Ambulatory Visit: Payer: Self-pay | Admitting: Oncology

## 2015-08-13 ENCOUNTER — Ambulatory Visit (HOSPITAL_BASED_OUTPATIENT_CLINIC_OR_DEPARTMENT_OTHER): Payer: BC Managed Care – PPO | Admitting: Oncology

## 2015-08-13 ENCOUNTER — Other Ambulatory Visit (HOSPITAL_BASED_OUTPATIENT_CLINIC_OR_DEPARTMENT_OTHER): Payer: BC Managed Care – PPO

## 2015-08-13 ENCOUNTER — Telehealth: Payer: Self-pay | Admitting: Oncology

## 2015-08-13 ENCOUNTER — Encounter: Payer: Self-pay | Admitting: Oncology

## 2015-08-13 VITALS — BP 110/52 | HR 58 | Temp 98.6°F | Resp 20 | Ht 65.0 in | Wt 147.2 lb

## 2015-08-13 DIAGNOSIS — C50311 Malignant neoplasm of lower-inner quadrant of right female breast: Secondary | ICD-10-CM | POA: Diagnosis not present

## 2015-08-13 DIAGNOSIS — E039 Hypothyroidism, unspecified: Secondary | ICD-10-CM

## 2015-08-13 DIAGNOSIS — Z720 Tobacco use: Secondary | ICD-10-CM

## 2015-08-13 DIAGNOSIS — C50111 Malignant neoplasm of central portion of right female breast: Secondary | ICD-10-CM

## 2015-08-13 DIAGNOSIS — E559 Vitamin D deficiency, unspecified: Secondary | ICD-10-CM

## 2015-08-13 DIAGNOSIS — C50319 Malignant neoplasm of lower-inner quadrant of unspecified female breast: Secondary | ICD-10-CM

## 2015-08-13 DIAGNOSIS — Z79899 Other long term (current) drug therapy: Secondary | ICD-10-CM

## 2015-08-13 DIAGNOSIS — M8580 Other specified disorders of bone density and structure, unspecified site: Secondary | ICD-10-CM

## 2015-08-13 LAB — COMPREHENSIVE METABOLIC PANEL
ALT: 20 U/L (ref 0–55)
AST: 27 U/L (ref 5–34)
Albumin: 4 g/dL (ref 3.5–5.0)
Alkaline Phosphatase: 73 U/L (ref 40–150)
Anion Gap: 9 mEq/L (ref 3–11)
BUN: 8.1 mg/dL (ref 7.0–26.0)
CHLORIDE: 109 meq/L (ref 98–109)
CO2: 22 mEq/L (ref 22–29)
Calcium: 9.9 mg/dL (ref 8.4–10.4)
Creatinine: 0.9 mg/dL (ref 0.6–1.1)
EGFR: 84 mL/min/{1.73_m2} — ABNORMAL LOW (ref >90)
GLUCOSE: 100 mg/dL (ref 70–140)
POTASSIUM: 4.1 meq/L (ref 3.5–5.1)
SODIUM: 140 meq/L (ref 136–145)
Total Bilirubin: <0.3 mg/dL (ref 0.20–1.20)
Total Protein: 7.7 g/dL (ref 6.4–8.3)

## 2015-08-13 LAB — CBC WITH DIFFERENTIAL/PLATELET
BASO%: 0.8 % (ref 0.0–2.0)
BASOS ABS: 0.1 10*3/uL (ref 0.0–0.1)
EOS ABS: 0.3 10*3/uL (ref 0.0–0.5)
EOS%: 3.6 % (ref 0.0–7.0)
HCT: 42 % (ref 34.8–46.6)
HGB: 13.6 g/dL (ref 11.6–15.9)
LYMPH#: 2 10*3/uL (ref 0.9–3.3)
LYMPH%: 23.6 % (ref 14.0–49.7)
MCH: 28.8 pg (ref 25.1–34.0)
MCHC: 32.4 g/dL (ref 31.5–36.0)
MCV: 88.8 fL (ref 79.5–101.0)
MONO#: 0.7 10*3/uL (ref 0.1–0.9)
MONO%: 8.2 % (ref 0.0–14.0)
NEUT#: 5.5 10*3/uL (ref 1.5–6.5)
NEUT%: 63.8 % (ref 38.4–76.8)
Platelets: 193 10*3/uL (ref 145–400)
RBC: 4.73 10*6/uL (ref 3.70–5.45)
RDW: 15.9 % — AB (ref 11.2–14.5)
WBC: 8.7 10*3/uL (ref 3.9–10.3)

## 2015-08-13 NOTE — Progress Notes (Signed)
OFFICE PROGRESS NOTE   August 13, 2015   Physicians::( K.Khan), [name] (PCP), H.Dalbert Batman, J.Varanasi, _Evelyn Varnado (gyn, [place]), S.Rogelio Seen, GI [place], Metta Clines  INTERVAL HISTORY:  Patient is seen, alone for visit, in continuing attention to stage 1 invasive ductal carcinoma of right breast, diagnosed 06-2012, post lumpectomy and RT, and on Arimidex since 10-2012. She had bilateral tomo mammograms at [facility] 06-28-15, with heterogeneously dense breast tissue but no other findings of concern. Last DEXA at [facility] 11-2012 with osteopenia, still not repeated. Discussed again with patient, particularly important as aromatase inhibitor can decrease bone density further; she agrees with having bone density done and we will reschedule.   Patient has several complaints, none of which are obviously related to the breast cancer history or that treatment. She has noticed no changes in breasts bilaterally and does not have arthralgia symptoms in distal small joints, tho chronic discomfort hip and neck unchanged. She sprained left ankle and is in orthopedic boot. She is for hip replacement by Dr Novella Olive on 08-20-15. She has had clear rhinorrhea and post nasal drainage for past 24 hrs, no fever, no lower respiratory symptoms, does have environmental allergies. She has resumed smoking after nearly stopping, is interested in completely [place]. Appetite and energy at baseline.. Bowels ok. No bleeding. Remainder of 10 point Review of Systems unchanged/ negative  Flu vaccine 06-02-15  ONCOLOGIC HISTORY #1 06-2012  5 mm mass in the right breast at the 5:00 position. The biopsy showed invasive ductal carcinoma with associated DCIS tumor was ER +100% PR +60% Ki-67 6% and HER-2/neu negative. It was grade 1. #2 Central lumpectomy on 07/15/2012,  pathology  0.6 cm ER positive PR positive HER-2/neu negative low grade invasive ductal carcinoma. Sentinel node was  negative for metastatic disease.  #3  radiation therapy to the right breast. She tolerated this well. #4. adjuvant antiestrogen therapy consisting of Arimidex 1 mg daily begun ~ 11-04-2012.  Total of 5 years of therapy is planned     Objective:  Vital signs in last 24 hours:  BP 110/52 mmHg  Pulse 58  Temp(Src) 98.6 F (37 C) (Oral)  Resp 20  Ht _0  (1.651 m)  Wt 147 lb 3.2 oz (66.769 kg)  BMI 24.50 kg/m2  SpO2 99%  Alert, oriented and appropriate. Ambulatory without assistance difficulty.  Weight up 1 lb HEENT:PERRL, sclerae not icteric. Oral mucosa moist without lesions, posterior pharynx minimal dull erythema bilaterally no exudate. Nasal turbinates minimally boggy no purulent drainage.  Neck supple. No JVD.  Lymphatics:no cervical,supraclavicular, axillary or inguinal adenopathy Resp: somewhat diminished BS bilaterally otherwise clear to auscultation bilaterally and no dullness to percussion bilaterally Cardio: regular rate and rhythm. No gallop. GI: soft, nontender, not distended, no mass or organomegaly. Normally active bowel sounds. Surgical incision not remarkable. Musculoskeletal/ Extremities: RLE, UE without pitting edema, cords, tenderness. LLE in orthopedic boot to knee Neuro: nonfocal Skin without rash, ecchymosis, petechiae Breasts: right with well healed central partial mastectomy scar with thickened areas along incisions unchanged, otherwise bilaterally without dominant mass, skin or nipple findings. Axillae benign.   Lab Results:  Results for orders placed or performed in visit on 08/13/15  CBC with Differential  Result Value Ref Range   WBC 8.7 3.9 - 10.3 10e3/uL   NEUT# 5.5 1.5 - 6.5 10e3/uL   HGB 13.6 11.6 - 15.9 g/dL   HCT 42.0 34.8 - 46.6 %   Platelets 193 145 - 400 10e3/uL   MCV 88.8  79.5 - 101.0 fL   MCH 28.8 25.1 - 34.0 pg   MCHC 32.4 31.5 - 36.0 g/dL   RBC 4.73 3.70 - 5.45 10e6/uL   RDW 15.9 (H) 11.2 - 14.5 %   lymph# 2.0 0.9 - 3.3 10e3/uL    MONO# 0.7 0.1 - 0.9 10e3/uL   Eosinophils Absolute 0.3 0.0 - 0.5 10e3/uL   Basophils Absolute 0.1 0.0 - 0.1 10e3/uL   NEUT% 63.8 38.4 - 76.8 %   LYMPH% 23.6 14.0 - 49.7 %   MONO% 8.2 0.0 - 14.0 %   EOS% 3.6 0.0 - 7.0 %   BASO% 0.8 0.0 - 2.0 %  Comprehensive metabolic panel  Result Value Ref Range   Sodium 140 136 - 145 mEq/L   Potassium 4.1 3.5 - 5.1 mEq/L   Chloride 109 98 - 109 mEq/L   CO2 22 22 - 29 mEq/L   Glucose 100 70 - 140 mg/dl   BUN 8.1 7.0 - 26.0 mg/dL   Creatinine 0.9 0.6 - 1.1 mg/dL   Total Bilirubin <0.30 0.20 - 1.20 mg/dL   Alkaline Phosphatase 73 40 - 150 U/L   AST 27 5 - 34 U/L   ALT 20 0 - 55 U/L   Total Protein 7.7 6.4 - 8.3 g/dL   Albumin 4.0 3.5 - 5.0 g/dL   Calcium 9.9 8.4 - 10.4 mg/dL   Anion Gap 9 3 - 11 mEq/L   EGFR 84 (L) >90 ml/min/1.73 m2   Vit D available after visit 33.  Studies/Results:  EXAM: DIGITAL DIAGNOSTIC BILATERAL MAMMOGRAM WITH 3D TOMOSYNTHESIS AND CAD [facility]  COMPARISON: Previous exam(s).  ACR Breast Density Category c: The breast tissue is heterogeneously dense, which may obscure small masses.  FINDINGS: Digital bilateral diagnostic mammography demonstrates a heterogeneously dense parenchymal pattern which may obscure detection of small masses. Note is again made of post lumpectomy scarring and evolving fat necrosis in the subareolar right breast. Spot-compression magnification view of the lumpectomy bed demonstrates expected postsurgical scarring and benign rim calcification. No new mass or suspicious microcalcification. There has been no significant interval change from comparison mammograms.  Mammographic images were processed with CAD.  IMPRESSION: Post lumpectomy scarring in the subareolar right breast without mammographic findings of malignancy.  RECOMMENDATION: Diagnostic mammogram is suggested in 1 year. (Code:DM-B-01Y)  I have discussed the findings and recommendations with the  patient. Results were also provided in writing at the conclusion of the visit. If applicable, a reminder letter will be sent to the patient regarding the next appointment.  BI-RADS CATEGORY 2: Benign.    CHEST 2 VIEW   08-10-15  COMPARISON: July 09, 2012  FINDINGS: There is no edema or consolidation. The heart size and pulmonary vascularity are normal. No adenopathy. No bone lesions. There is postoperative change in the right breast region.  IMPRESSION: No edema or consolidation.   Medications: I have reviewed the patient's current medications.  DISCUSSION Smoking cessation discussed and encouraged. As she will be hospitalized for a few days then into rehab with upcoming hip surgery, this will help with initial cessation  Allergies vs viral URI: she will use allergy meds today  Needs bone density scan repeated due to known low bone density (2014 osteopenia) on chronic high risk medication aromatase inhibitor. Patient understands and agrees.  Assessment/Plan:  1.Stage 1 (T1bN0) grade 1 ER PR + HER 2 negative invasive ductal carcinoma of central right breast: post central lumpectomy with sentinel node 06-2012, local radiation and on Arimidex since  11-04-2012.. Follow up medical oncology 6 months or sooner if concerns. Should have lipid panel by PCP or cardiology 2.osteopenia by bone density scan 11-30-2012. It is medically necessary for yearly bone density scans while on high risk aromatase inhibitor, however this was not done due to scheduling issue in fall 2015, ,then machine not working day of her mammogram 06-2015 and she missed rescheduled apt. Ordered again now. Needs regular weight bearing exercise. 3.degenerative arthritis hips:post right hip replacement and for left hip replacement by Dr Novella Olive 08-20-15. 4.post MI 2001, HTN, elevated lipids. Saw Dr Irish Lack 11-03-2014 5.hx GERD, no complaints of this now 6.ongoing tobacco: has resumed smoking. Discussed need to DC  entirely and cessation strategies. CXR done 08-10-15 preop for orthopedic surgery without obvious concern 7.lett wrist tenosynovitis improved with release surgery by Dr Rhona Raider 8.headaches resolved, apparently sinus related. 9.hypothyroidism 10.sickle trait and use of B12 supplement 11..allergic rhinitis vs early viral URI: will resume allergy medications today.  All questions answered and patient is in agreement with recommendations and plans. cc Dr Novella Olive, Dr Tamala Julian, Dr Simona Huh  Time spent 25 min including >50% counselng and coordination of care.   LIVESAY,LENNIS P, MD   08/13/2015, 2:01 PM

## 2015-08-13 NOTE — Telephone Encounter (Signed)
Appointments made and avs printed for patient,She is taking various herbal and vitamin products, her dexa order has been faxed to the breast center due to hold times

## 2015-08-14 LAB — VITAMIN D 25 HYDROXY (VIT D DEFICIENCY, FRACTURES): VIT D 25 HYDROXY: 33 ng/mL (ref 30–100)

## 2015-08-15 DIAGNOSIS — Z79899 Other long term (current) drug therapy: Secondary | ICD-10-CM | POA: Insufficient documentation

## 2015-08-15 DIAGNOSIS — C50111 Malignant neoplasm of central portion of right female breast: Secondary | ICD-10-CM | POA: Insufficient documentation

## 2015-08-15 DIAGNOSIS — Z720 Tobacco use: Secondary | ICD-10-CM | POA: Insufficient documentation

## 2015-08-15 DIAGNOSIS — Z170 Estrogen receptor positive status [ER+]: Secondary | ICD-10-CM

## 2015-08-15 DIAGNOSIS — M8580 Other specified disorders of bone density and structure, unspecified site: Secondary | ICD-10-CM | POA: Insufficient documentation

## 2015-08-15 NOTE — H&P (Signed)
TOTAL HIP ADMISSION H&P  Patient is admitted for left total hip arthroplasty and right trigger thumb surgery.  Subjective:  Chief Complaint: left hip pain and right thumb pain  HPI: Amber [name], 54 y.o. female, has a history of pain and functional disability in the left hip(s) due to arthritis and patient has failed non-surgical conservative treatments for greater than 12 weeks to include NSAID's and/or analgesics, corticosteriod injections, flexibility and strengthening excercises, use of assistive devices, weight reduction as appropriate and activity modification.  Onset of symptoms was gradual starting 5 years ago with gradually worsening course since that time.The patient noted no past surgery on the left hip(s).  Patient currently rates pain in the left hip at 10 out of 10 with activity. Patient has night pain, worsening of pain with activity and weight bearing, trendelenberg gait, pain that interfers with activities of daily living and crepitus. Patient has evidence of subchondral cysts, subchondral sclerosis, periarticular osteophytes and joint space narrowing by imaging studies. This condition presents safety issues increasing the risk of falls. There is no current active infection.she also has continued pain and active triggering of the right thumb.  She has failed injections and other conservative treatment options and this ready for surgical release of the A1 pulley.  Patient Active Problem List   Diagnosis Date Noted  . Tobacco abuse 08/15/2015  . High risk medication use 08/15/2015  . Osteopenia determined by x-ray 08/15/2015  . Malignant neoplasm of central portion of right female breast ([place]) 08/15/2015  . Old myocardial infarction 11/03/2013  . Coronary atherosclerosis of native coronary artery 11/03/2013  . Mixed hyperlipidemia 11/03/2013  . Tobacco use disorder 11/03/2013  . Breast cancer ([place])   . History of radiation therapy   . DJD (degenerative joint disease) of hip  08/17/2012    Class: Chronic  . Cancer of lower-inner quadrant of female breast ([place]) 06/22/2012   Past Medical History  Diagnosis Date  . Hyperlipidemia   . Osteoporosis   . Hypertension     Dr. [facility]  . GERD (gastroesophageal reflux disease)     hx of   . Neuromuscular disorder ([place])     carpal tunnel right hand  . Chronic neck pain     hx of  . Hypothyroidism   . Heart attack [facility]) 2001    Dr. Scarlette Calico  . Anginal pain ([place]) 2001  . Pernicious anemia   . Sickle cell trait ([place])   . Migraines     "last one was 1990's" (08/17/2012)  . DJD (degenerative joint disease) of hip     bil hip  . Breast cancer ([place])     invasive ductal carcinoma right breast; ER, PR + and HER 2 -  . History of radiation therapy 09/16/12- 10/19/12    right breast  . Tobacco abuse   . Allergic rhinitis   . Sleep apnea     does use a cpap  . Wears glasses     Past Surgical History  Procedure Laterality Date  . Nasal sinus surgery  1990  . Coronary angioplasty with stent placement  2001    "1" (08/17/2012)  . Partial mastectomy with needle localization and axillary sentinel lymph node bx  07/15/2012    Procedure: PARTIAL MASTECTOMY WITH NEEDLE LOCALIZATION AND AXILLARY SENTINEL LYMPH NODE BX;  Surgeon: Adin Hector, MD;  Location: [place];  Service: General;  Laterality: Right;  . Total hip arthroplasty  08/17/2012    "right" (08/17/2012)  .  Breast biopsy  06/2012    "right" (08/17/2012)  . Total hip arthroplasty  08/17/2012    Procedure: TOTAL HIP ARTHROPLASTY ANTERIOR APPROACH;  Surgeon: Hessie Dibble, MD;  Location: [place];  Service: Orthopedics;  Laterality: Right;  . Colonoscopy    . Dorsal compartment release Left 08/18/2014    Procedure: LEFT FIRST RELEASE DORSAL COMPARTMENT ([place]);  Surgeon: Hessie Dibble, MD;  Location: [place];  Service: Orthopedics;  Laterality: Left;    No prescriptions prior to admission   Allergies   Allergen Reactions  . Betadine [Povidone Iodine] Rash  . Tramadol Nausea And Vomiting and Other (See Comments)    "couldn't stop vomiting" (08/17/2012)  . Benzoyl Peroxide Itching and Rash    Social History  Substance Use Topics  . Smoking status: Current Some Day Smoker -- 0.50 packs/day for 35 years    Types: Cigarettes  . Smokeless tobacco: Never Used  . Alcohol Use: Yes     Comment: 08/17/2012 "couple mixed drinks; couple times/month; sometimes it's beer"    Family History  Problem Relation Age of Onset  . Diabetes Mother   . Heart disease Mother   . Cancer Maternal Grandmother     pancreatic  . Cancer Cousin     pancreatic  . Parkinson's disease Maternal Grandfather      Review of Systems  Musculoskeletal: Positive for joint pain.       Left hip and right thumb  All other systems reviewed and are negative.   Objective:  Physical Exam  Constitutional: She is oriented to person, place, and time. She appears well-developed and well-nourished.  HENT:  Head: Normocephalic and atraumatic.  Eyes: Pupils are equal, round, and reactive to light.  Neck: Normal range of motion.  Cardiovascular: Normal rate and regular rhythm.   Respiratory: Effort normal.  GI: Soft.  Musculoskeletal:  Left hip range of motion is limited due to pain.  She has 5 out of 5 strength and normal sensation throughout left lower extremity.  She is neurovascularly intact distally.  Right thumb shows tenderness to palpation over the A1 pulley.  There is active triggering of the thumb with flexion and extension.  Normal sensation throughout.  She is neurovascularly intact distally.  Neurological: She is alert and oriented to person, place, and time.  Skin: Skin is warm and dry.  Psychiatric: She has a normal mood and affect. Her behavior is normal. Judgment and thought content normal.    Vital signs in last 24 hours:    Labs:   Estimated body mass index is 24.11 kg/(m^2) as calculated from the  following:   Height as of 11/13/14: 5\' 5"  (1.651 m).   Weight as of 11/13/14: 65.726 kg (144 lb 14.4 oz).   Imaging Review Plain radiographs demonstrate severe degenerative joint disease of the left hip(s). The bone quality appears to be good for age and reported activity level.  Assessment/Plan:  End stage primary arthritis, left hip and right trigger thumb.  The patient history, physical examination, clinical judgement of the provider and imaging studies are consistent with end stage degenerative joint disease of the left hip(s) and total hip arthroplasty is deemed medically necessary.  She also has a right trigger thumb that has failed conservative treatment measures and needs to be surgically released.The treatment options including medical management, injection therapy, arthroscopy and arthroplasty were discussed at length. The risks and benefits of total hip arthroplasty were presented and reviewed. The risks due to aseptic loosening, infection,  stiffness, dislocation/subluxation,  thromboembolic complications and other imponderables were discussed.  The patient acknowledged the explanation, agreed to proceed with the plan and consent was signed. Patient is being admitted for inpatient treatment for surgery, pain control, PT, OT, prophylactic antibiotics, VTE prophylaxis, progressive ambulation and ADL's and discharge planning.The patient is planning to be discharged to skilled nursing facility

## 2015-08-20 MED ORDER — CHLORHEXIDINE GLUCONATE 4 % EX LIQD: 60.0000 mL | Freq: Once | CUTANEOUS | Status: DC

## 2015-08-20 MED ORDER — LACTATED RINGERS IV SOLN: INTRAVENOUS | Status: DC

## 2015-08-20 MED ORDER — DEXTROSE 5 % IV SOLN
3.0000 g | INTRAVENOUS | Status: AC
  Administered 2015-08-21: 3 g via INTRAVENOUS
  Filled 2015-08-20 (×2): qty 3000

## 2015-08-21 ENCOUNTER — Inpatient Hospital Stay (HOSPITAL_COMMUNITY)
Admission: RE | Admit: 2015-08-21 | Discharge: 2015-08-24 | DRG: 470 | Disposition: A | Discharge status: Home Health | Payer: BC Managed Care – PPO | Source: Ambulatory Visit | Attending: Orthopaedic Surgery | Admitting: Orthopaedic Surgery

## 2015-08-21 ENCOUNTER — Inpatient Hospital Stay (HOSPITAL_COMMUNITY): Payer: BC Managed Care – PPO | Admitting: Anesthesiology

## 2015-08-21 ENCOUNTER — Inpatient Hospital Stay (HOSPITAL_COMMUNITY): Payer: BC Managed Care – PPO

## 2015-08-21 ENCOUNTER — Encounter (HOSPITAL_COMMUNITY)
Admission: RE | Disposition: A | Discharge status: Home Health | Payer: Self-pay | Source: Ambulatory Visit | Attending: Orthopaedic Surgery

## 2015-08-21 DIAGNOSIS — Z791 Long term (current) use of non-steroidal anti-inflammatories (NSAID): Secondary | ICD-10-CM | POA: Diagnosis not present

## 2015-08-21 DIAGNOSIS — M810 Age-related osteoporosis without current pathological fracture: Secondary | ICD-10-CM | POA: Diagnosis present

## 2015-08-21 DIAGNOSIS — M65311 Trigger thumb, right thumb: Secondary | ICD-10-CM | POA: Diagnosis present

## 2015-08-21 DIAGNOSIS — I10 Essential (primary) hypertension: Secondary | ICD-10-CM | POA: Diagnosis present

## 2015-08-21 DIAGNOSIS — Z96641 Presence of right artificial hip joint: Secondary | ICD-10-CM | POA: Diagnosis present

## 2015-08-21 DIAGNOSIS — Z888 Allergy status to other drugs, medicaments and biological substances status: Secondary | ICD-10-CM

## 2015-08-21 DIAGNOSIS — Z79899 Other long term (current) drug therapy: Secondary | ICD-10-CM | POA: Diagnosis not present

## 2015-08-21 DIAGNOSIS — M1612 Unilateral primary osteoarthritis, left hip: Secondary | ICD-10-CM | POA: Diagnosis present

## 2015-08-21 DIAGNOSIS — Z7982 Long term (current) use of aspirin: Secondary | ICD-10-CM

## 2015-08-21 DIAGNOSIS — E039 Hypothyroidism, unspecified: Secondary | ICD-10-CM | POA: Diagnosis present

## 2015-08-21 DIAGNOSIS — Z955 Presence of coronary angioplasty implant and graft: Secondary | ICD-10-CM

## 2015-08-21 DIAGNOSIS — F17210 Nicotine dependence, cigarettes, uncomplicated: Secondary | ICD-10-CM | POA: Diagnosis present

## 2015-08-21 DIAGNOSIS — Z885 Allergy status to narcotic agent status: Secondary | ICD-10-CM

## 2015-08-21 DIAGNOSIS — C50311 Malignant neoplasm of lower-inner quadrant of right female breast: Secondary | ICD-10-CM | POA: Diagnosis present

## 2015-08-21 DIAGNOSIS — Z79811 Long term (current) use of aromatase inhibitors: Secondary | ICD-10-CM | POA: Diagnosis not present

## 2015-08-21 DIAGNOSIS — I25119 Atherosclerotic heart disease of native coronary artery with unspecified angina pectoris: Secondary | ICD-10-CM | POA: Diagnosis present

## 2015-08-21 DIAGNOSIS — K219 Gastro-esophageal reflux disease without esophagitis: Secondary | ICD-10-CM | POA: Diagnosis present

## 2015-08-21 DIAGNOSIS — Z419 Encounter for procedure for purposes other than remedying health state, unspecified: Secondary | ICD-10-CM

## 2015-08-21 DIAGNOSIS — Z923 Personal history of irradiation: Secondary | ICD-10-CM

## 2015-08-21 DIAGNOSIS — Z7983 Long term (current) use of bisphosphonates: Secondary | ICD-10-CM | POA: Diagnosis not present

## 2015-08-21 DIAGNOSIS — E782 Mixed hyperlipidemia: Secondary | ICD-10-CM | POA: Diagnosis present

## 2015-08-21 DIAGNOSIS — M25552 Pain in left hip: Secondary | ICD-10-CM | POA: Diagnosis present

## 2015-08-21 DIAGNOSIS — I252 Old myocardial infarction: Secondary | ICD-10-CM

## 2015-08-21 DIAGNOSIS — D510 Vitamin B12 deficiency anemia due to intrinsic factor deficiency: Secondary | ICD-10-CM | POA: Diagnosis present

## 2015-08-21 DIAGNOSIS — G4730 Sleep apnea, unspecified: Secondary | ICD-10-CM | POA: Diagnosis present

## 2015-08-21 HISTORY — PX: TOTAL HIP ARTHROPLASTY: SHX124

## 2015-08-21 SURGERY — ARTHROPLASTY, HIP, TOTAL, ANTERIOR APPROACH
Anesthesia: General | Laterality: Left

## 2015-08-21 MED ORDER — ONDANSETRON HCL 4 MG PO TABS: 4.0000 mg | ORAL_TABLET | Freq: Four times a day (QID) | ORAL | Status: DC | PRN

## 2015-08-21 MED ORDER — ALUM & MAG HYDROXIDE-SIMETH 200-200-20 MG/5ML PO SUSP: 30.0000 mL | ORAL | Status: DC | PRN

## 2015-08-21 MED ORDER — ROCURONIUM BROMIDE 100 MG/10ML IV SOLN
INTRAVENOUS | Status: DC | PRN
  Administered 2015-08-21: 50 mg via INTRAVENOUS

## 2015-08-21 MED ORDER — METHOCARBAMOL 1000 MG/10ML IJ SOLN
500.0000 mg | INTRAVENOUS | Status: AC
  Administered 2015-08-21: 500 mg via INTRAVENOUS
  Filled 2015-08-21: qty 5

## 2015-08-21 MED ORDER — ROSUVASTATIN CALCIUM 10 MG PO TABS
20.0000 mg | ORAL_TABLET | ORAL | Status: DC
  Administered 2015-08-22 – 2015-08-24 (×2): 20 mg via ORAL
  Filled 2015-08-21 (×2): qty 2

## 2015-08-21 MED ORDER — NEOSTIGMINE METHYLSULFATE 10 MG/10ML IV SOLN
INTRAVENOUS | Status: DC | PRN
  Administered 2015-08-21: 3 mg via INTRAVENOUS

## 2015-08-21 MED ORDER — MENTHOL 3 MG MT LOZG
1.0000 | LOZENGE | OROMUCOSAL | Status: DC | PRN
  Filled 2015-08-21: qty 9

## 2015-08-21 MED ORDER — DEXTROSE 5 % IV SOLN
10.0000 mg | INTRAVENOUS | Status: DC | PRN
  Administered 2015-08-21: 15 ug/min via INTRAVENOUS

## 2015-08-21 MED ORDER — HYDROMORPHONE HCL 1 MG/ML IJ SOLN
0.5000 mg | INTRAMUSCULAR | Status: DC | PRN
  Administered 2015-08-21: 1 mg via INTRAVENOUS
  Filled 2015-08-21: qty 1

## 2015-08-21 MED ORDER — MIDAZOLAM HCL 2 MG/2ML IJ SOLN
INTRAMUSCULAR | Status: AC
  Filled 2015-08-21: qty 2

## 2015-08-21 MED ORDER — GLYCOPYRROLATE 0.2 MG/ML IJ SOLN
INTRAMUSCULAR | Status: DC | PRN
  Administered 2015-08-21: .6 mg via INTRAVENOUS
  Administered 2015-08-21: 0.2 mg via INTRAVENOUS

## 2015-08-21 MED ORDER — PHENYLEPHRINE HCL 10 MG/ML IJ SOLN
INTRAMUSCULAR | Status: DC | PRN
  Administered 2015-08-21: 80 ug via INTRAVENOUS

## 2015-08-21 MED ORDER — BUPIVACAINE LIPOSOME 1.3 % IJ SUSP
INTRAMUSCULAR | Status: DC | PRN
  Administered 2015-08-21: 30 mL

## 2015-08-21 MED ORDER — METOCLOPRAMIDE HCL 5 MG PO TABS: 5.0000 mg | ORAL_TABLET | Freq: Three times a day (TID) | ORAL | Status: DC | PRN

## 2015-08-21 MED ORDER — LEVOTHYROXINE SODIUM 88 MCG PO TABS
88.0000 ug | ORAL_TABLET | Freq: Every day | ORAL | Status: DC
  Administered 2015-08-22 – 2015-08-24 (×3): 88 ug via ORAL
  Filled 2015-08-21 (×3): qty 1

## 2015-08-21 MED ORDER — ONDANSETRON HCL 4 MG/2ML IJ SOLN
INTRAMUSCULAR | Status: DC | PRN
  Administered 2015-08-21: 4 mg via INTRAVENOUS

## 2015-08-21 MED ORDER — PROMETHAZINE HCL 25 MG/ML IJ SOLN: 6.2500 mg | INTRAMUSCULAR | Status: DC | PRN

## 2015-08-21 MED ORDER — MIDAZOLAM HCL 5 MG/5ML IJ SOLN
INTRAMUSCULAR | Status: DC | PRN
  Administered 2015-08-21: 2 mg via INTRAVENOUS

## 2015-08-21 MED ORDER — LIDOCAINE HCL (CARDIAC) 20 MG/ML IV SOLN
INTRAVENOUS | Status: DC | PRN
  Administered 2015-08-21: 60 mg via INTRAVENOUS

## 2015-08-21 MED ORDER — ASPIRIN EC 325 MG PO TBEC
325.0000 mg | DELAYED_RELEASE_TABLET | Freq: Two times a day (BID) | ORAL | Status: DC
  Administered 2015-08-22 – 2015-08-24 (×5): 325 mg via ORAL
  Filled 2015-08-21 (×5): qty 1

## 2015-08-21 MED ORDER — HYDROMORPHONE HCL 1 MG/ML IJ SOLN
0.2500 mg | INTRAMUSCULAR | Status: DC | PRN
  Administered 2015-08-21 (×4): 0.5 mg via INTRAVENOUS

## 2015-08-21 MED ORDER — BUPIVACAINE-EPINEPHRINE (PF) 0.5% -1:200000 IJ SOLN
INTRAMUSCULAR | Status: AC
  Filled 2015-08-21: qty 30

## 2015-08-21 MED ORDER — PHENOL 1.4 % MT LIQD: 1.0000 | OROMUCOSAL | Status: DC | PRN

## 2015-08-21 MED ORDER — ACETAMINOPHEN 650 MG RE SUPP: 650.0000 mg | Freq: Four times a day (QID) | RECTAL | Status: DC | PRN

## 2015-08-21 MED ORDER — ACETAMINOPHEN 325 MG PO TABS
650.0000 mg | ORAL_TABLET | Freq: Four times a day (QID) | ORAL | Status: DC | PRN
  Administered 2015-08-22: 650 mg via ORAL
  Filled 2015-08-21: qty 2

## 2015-08-21 MED ORDER — ATENOLOL 50 MG PO TABS
25.0000 mg | ORAL_TABLET | Freq: Every day | ORAL | Status: DC
Start: 2015-08-22 — End: 2015-08-24
  Administered 2015-08-22 – 2015-08-24 (×3): 25 mg via ORAL
  Filled 2015-08-21 (×3): qty 1

## 2015-08-21 MED ORDER — DIPHENHYDRAMINE HCL 12.5 MG/5ML PO ELIX: 12.5000 mg | ORAL_SOLUTION | ORAL | Status: DC | PRN

## 2015-08-21 MED ORDER — BISACODYL 5 MG PO TBEC: 5.0000 mg | DELAYED_RELEASE_TABLET | Freq: Every day | ORAL | Status: DC | PRN

## 2015-08-21 MED ORDER — PROPOFOL 10 MG/ML IV BOLUS
INTRAVENOUS | Status: DC | PRN
  Administered 2015-08-21: 140 mg via INTRAVENOUS

## 2015-08-21 MED ORDER — TRANEXAMIC ACID 1000 MG/10ML IV SOLN
1000.0000 mg | INTRAVENOUS | Status: AC
  Administered 2015-08-21: 1000 mg via INTRAVENOUS
  Filled 2015-08-21: qty 10

## 2015-08-21 MED ORDER — HYDROMORPHONE HCL 1 MG/ML IJ SOLN
INTRAMUSCULAR | Status: AC
  Filled 2015-08-21: qty 1

## 2015-08-21 MED ORDER — 0.9 % SODIUM CHLORIDE (POUR BTL) OPTIME
TOPICAL | Status: DC | PRN
  Administered 2015-08-21: 1000 mL

## 2015-08-21 MED ORDER — METHOCARBAMOL 500 MG PO TABS: 500.0000 mg | ORAL_TABLET | Freq: Four times a day (QID) | ORAL | Status: DC | PRN

## 2015-08-21 MED ORDER — FENTANYL CITRATE (PF) 100 MCG/2ML IJ SOLN
INTRAMUSCULAR | Status: DC | PRN
  Administered 2015-08-21 (×5): 50 ug via INTRAVENOUS

## 2015-08-21 MED ORDER — PROPOFOL 10 MG/ML IV BOLUS
INTRAVENOUS | Status: AC
  Filled 2015-08-21: qty 20

## 2015-08-21 MED ORDER — LIDOCAINE HCL (CARDIAC) 20 MG/ML IV SOLN
INTRAVENOUS | Status: AC
  Filled 2015-08-21: qty 5

## 2015-08-21 MED ORDER — CYCLOBENZAPRINE HCL 10 MG PO TABS
5.0000 mg | ORAL_TABLET | Freq: Three times a day (TID) | ORAL | Status: DC | PRN
  Administered 2015-08-23 – 2015-08-24 (×2): 5 mg via ORAL
  Filled 2015-08-21 (×2): qty 1

## 2015-08-21 MED ORDER — ONDANSETRON HCL 4 MG/2ML IJ SOLN
INTRAMUSCULAR | Status: AC
  Filled 2015-08-21: qty 2

## 2015-08-21 MED ORDER — CEFAZOLIN SODIUM-DEXTROSE 2-3 GM-% IV SOLR
2.0000 g | Freq: Four times a day (QID) | INTRAVENOUS | Status: AC
  Administered 2015-08-21 – 2015-08-22 (×2): 2 g via INTRAVENOUS
  Filled 2015-08-21 (×4): qty 50

## 2015-08-21 MED ORDER — METOCLOPRAMIDE HCL 5 MG/ML IJ SOLN: 5.0000 mg | Freq: Three times a day (TID) | INTRAMUSCULAR | Status: DC | PRN

## 2015-08-21 MED ORDER — LACTATED RINGERS IV SOLN
INTRAVENOUS | Status: DC | PRN
  Administered 2015-08-21: 07:00:00 via INTRAVENOUS

## 2015-08-21 MED ORDER — HYDROCODONE-ACETAMINOPHEN 5-325 MG PO TABS
1.0000 | ORAL_TABLET | ORAL | Status: DC | PRN
  Administered 2015-08-21 – 2015-08-24 (×5): 2 via ORAL
  Filled 2015-08-21 (×6): qty 2

## 2015-08-21 MED ORDER — VECURONIUM BROMIDE 10 MG IV SOLR
INTRAVENOUS | Status: DC | PRN
  Administered 2015-08-21: 2 mg via INTRAVENOUS

## 2015-08-21 MED ORDER — BUPIVACAINE LIPOSOME 1.3 % IJ SUSP
20.0000 mL | INTRAMUSCULAR | Status: DC
  Filled 2015-08-21: qty 20

## 2015-08-21 MED ORDER — ROCURONIUM BROMIDE 50 MG/5ML IV SOLN
INTRAVENOUS | Status: AC
  Filled 2015-08-21: qty 1

## 2015-08-21 MED ORDER — LISINOPRIL 5 MG PO TABS
5.0000 mg | ORAL_TABLET | Freq: Every day | ORAL | Status: DC
  Administered 2015-08-22 – 2015-08-24 (×3): 5 mg via ORAL
  Filled 2015-08-21 (×3): qty 1

## 2015-08-21 MED ORDER — ANASTROZOLE 1 MG PO TABS
1.0000 mg | ORAL_TABLET | Freq: Every day | ORAL | Status: DC
  Administered 2015-08-22 – 2015-08-24 (×3): 1 mg via ORAL
  Filled 2015-08-21 (×5): qty 1

## 2015-08-21 MED ORDER — ZOLPIDEM TARTRATE 5 MG PO TABS: 5.0000 mg | ORAL_TABLET | Freq: Every evening | ORAL | Status: DC | PRN

## 2015-08-21 MED ORDER — DOCUSATE SODIUM 100 MG PO CAPS
100.0000 mg | ORAL_CAPSULE | Freq: Two times a day (BID) | ORAL | Status: DC
  Administered 2015-08-21 – 2015-08-24 (×6): 100 mg via ORAL
  Filled 2015-08-21 (×6): qty 1

## 2015-08-21 MED ORDER — NITROGLYCERIN 0.4 MG SL SUBL: 0.4000 mg | SUBLINGUAL_TABLET | SUBLINGUAL | Status: DC | PRN

## 2015-08-21 MED ORDER — EZETIMIBE 10 MG PO TABS
10.0000 mg | ORAL_TABLET | Freq: Every day | ORAL | Status: DC
  Administered 2015-08-21 – 2015-08-24 (×4): 10 mg via ORAL
  Filled 2015-08-21 (×4): qty 1

## 2015-08-21 MED ORDER — ONDANSETRON HCL 4 MG/2ML IJ SOLN
4.0000 mg | Freq: Four times a day (QID) | INTRAMUSCULAR | Status: DC | PRN
  Administered 2015-08-21 (×2): 4 mg via INTRAVENOUS
  Filled 2015-08-21 (×2): qty 2

## 2015-08-21 MED ORDER — BUPIVACAINE-EPINEPHRINE (PF) 0.5% -1:200000 IJ SOLN
INTRAMUSCULAR | Status: DC | PRN
  Administered 2015-08-21: 30 mL

## 2015-08-21 MED ORDER — METHOCARBAMOL 1000 MG/10ML IJ SOLN: 500.0000 mg | Freq: Four times a day (QID) | INTRAVENOUS | Status: DC | PRN

## 2015-08-21 MED ORDER — FENTANYL CITRATE (PF) 250 MCG/5ML IJ SOLN
INTRAMUSCULAR | Status: AC
  Filled 2015-08-21: qty 5

## 2015-08-21 MED ORDER — LACTATED RINGERS IV SOLN: INTRAVENOUS | Status: DC

## 2015-08-21 SURGICAL SUPPLY — 56 items
BLADE SAW SGTL 18X1.27X75 (BLADE) ×2 IMPLANT
BLADE SURG ROTATE 9660 (MISCELLANEOUS) IMPLANT
BNDG CONFORM 2 STRL LF (GAUZE/BANDAGES/DRESSINGS) ×1 IMPLANT
CAPT HIP TOTAL 2 ×1 IMPLANT
CELLS DAT CNTRL 66122 CELL SVR (MISCELLANEOUS) ×1 IMPLANT
CLOSURE STERI-STRIP 1/4X4 (GAUZE/BANDAGES/DRESSINGS) ×1 IMPLANT
COVER PERINEAL POST (MISCELLANEOUS) ×2 IMPLANT
COVER SURGICAL LIGHT HANDLE (MISCELLANEOUS) ×2 IMPLANT
DRAPE C-ARM 42X72 X-RAY (DRAPES) ×2 IMPLANT
DRAPE IMP U-DRAPE 54X76 (DRAPES) ×2 IMPLANT
DRAPE STERI IOBAN 125X83 (DRAPES) ×2 IMPLANT
DRAPE U-SHAPE 47X51 STRL (DRAPES) ×6 IMPLANT
DRESSING AQUACEL AQ EXTRA 4X5 (GAUZE/BANDAGES/DRESSINGS) ×1 IMPLANT
DRSG AQUACEL AG ADV 3.5X10 (GAUZE/BANDAGES/DRESSINGS) ×2 IMPLANT
DRSG EMULSION OIL 3X3 NADH (GAUZE/BANDAGES/DRESSINGS) ×1 IMPLANT
DURAPREP 26ML APPLICATOR (WOUND CARE) ×2 IMPLANT
ELECT BLADE 4.0 EZ CLEAN MEGAD (MISCELLANEOUS)
ELECT CAUTERY BLADE 6.4 (BLADE) ×2 IMPLANT
ELECT REM PT RETURN 9FT ADLT (ELECTROSURGICAL) ×2
ELECTRODE BLDE 4.0 EZ CLN MEGD (MISCELLANEOUS) IMPLANT
ELECTRODE REM PT RTRN 9FT ADLT (ELECTROSURGICAL) ×1 IMPLANT
FACESHIELD WRAPAROUND (MASK) ×4 IMPLANT
FACESHIELD WRAPAROUND OR TEAM (MASK) ×2 IMPLANT
GLOVE BIO SURGEON STRL SZ8 (GLOVE) ×4 IMPLANT
GLOVE BIOGEL PI IND STRL 8 (GLOVE) ×2 IMPLANT
GLOVE BIOGEL PI INDICATOR 8 (GLOVE) ×2
GOWN STRL REUS W/ TWL LRG LVL3 (GOWN DISPOSABLE) ×1 IMPLANT
GOWN STRL REUS W/ TWL XL LVL3 (GOWN DISPOSABLE) ×2 IMPLANT
GOWN STRL REUS W/TWL LRG LVL3 (GOWN DISPOSABLE) ×2
GOWN STRL REUS W/TWL XL LVL3 (GOWN DISPOSABLE) ×4
KIT BASIN OR (CUSTOM PROCEDURE TRAY) ×2 IMPLANT
KIT ROOM TURNOVER OR (KITS) ×2 IMPLANT
LINER BOOT UNIVERSAL DISP (MISCELLANEOUS) ×2 IMPLANT
MANIFOLD NEPTUNE II (INSTRUMENTS) ×2 IMPLANT
NS IRRIG 1000ML POUR BTL (IV SOLUTION) ×2 IMPLANT
PACK TOTAL JOINT (CUSTOM PROCEDURE TRAY) ×2 IMPLANT
PACK UNIVERSAL I (CUSTOM PROCEDURE TRAY) ×2 IMPLANT
PAD ARMBOARD 7.5X6 YLW CONV (MISCELLANEOUS) ×4 IMPLANT
RETRACTOR WND ALEXIS 18 MED (MISCELLANEOUS) ×1 IMPLANT
RTRCTR WOUND ALEXIS 18CM MED (MISCELLANEOUS) ×2
SPONGE GAUZE 4X4 12PLY STER LF (GAUZE/BANDAGES/DRESSINGS) ×1 IMPLANT
STAPLER VISISTAT 35W (STAPLE) ×2 IMPLANT
STRIP CLOSURE SKIN 1/2X4 (GAUZE/BANDAGES/DRESSINGS) ×1 IMPLANT
SUT ETHIBOND NAB CT1 #1 30IN (SUTURE) ×6 IMPLANT
SUT MNCRL AB 3-0 PS2 18 (SUTURE) ×1 IMPLANT
SUT VIC AB 0 CT1 27 (SUTURE)
SUT VIC AB 0 CT1 27XBRD ANBCTR (SUTURE) IMPLANT
SUT VIC AB 1 CT1 27 (SUTURE) ×2
SUT VIC AB 1 CT1 27XBRD ANBCTR (SUTURE) ×1 IMPLANT
SUT VIC AB 2-0 CT1 27 (SUTURE) ×2
SUT VIC AB 2-0 CT1 TAPERPNT 27 (SUTURE) ×1 IMPLANT
SUT VLOC 180 0 24IN GS25 (SUTURE) ×2 IMPLANT
TOWEL OR 17X24 6PK STRL BLUE (TOWEL DISPOSABLE) ×2 IMPLANT
TOWEL OR 17X26 10 PK STRL BLUE (TOWEL DISPOSABLE) ×4 IMPLANT
TRAY FOLEY CATH 14FR (SET/KITS/TRAYS/PACK) IMPLANT
WATER STERILE IRR 1000ML POUR (IV SOLUTION) ×4 IMPLANT

## 2015-08-21 NOTE — Care Management Note (Signed)
Case Management Note  Patient Details  Name: Amber [name] MRN: [id number] Date of Birth: 03/17/1961  Subjective/Objective:    54 yr old female s/p left total hip arthroplasty, right thumb trigger release.                Action/Plan: Patient is for short term rehab at [facility], Education officer, museum notified.   Expected Discharge Date:                  Expected Discharge Plan:     In-House Referral:     Discharge planning Services     Post Acute Care Choice:    Choice offered to:     DME Arranged:    DME Agency:     HH Arranged:    [place] Agency:     Status of Service:     Medicare Important Message Given:    Date Medicare IM Given:    Medicare IM give by:    Date Additional Medicare IM Given:    Additional Medicare Important Message give by:     If discussed at [place] of Stay Meetings, dates discussed:    Additional Comments:  Ninfa Meeker, RN 08/21/2015, 1:29 PM

## 2015-08-21 NOTE — Care Management (Signed)
Utilization review completed. Avielle Imbert, RN Case Manager [phone]. 

## 2015-08-21 NOTE — Anesthesia Preprocedure Evaluation (Signed)
Anesthesia Evaluation  Patient identified by MRN, date of birth, ID band Patient awake    Reviewed: Allergy & Precautions, NPO status , Patient's Chart, lab work & pertinent test results  Airway Mallampati: II  TM Distance: >3 FB Neck ROM: Full    Dental no notable dental hx.    Pulmonary sleep apnea and Continuous Positive Airway Pressure Ventilation , Current Smoker,    Pulmonary exam normal breath sounds clear to auscultation       Cardiovascular hypertension, Pt. on medications + CAD, + Past MI and + Cardiac Stents  Normal cardiovascular exam Rhythm:Regular Rate:Normal     Neuro/Psych negative neurological ROS  negative psych ROS   GI/Hepatic negative GI ROS, Neg liver ROS,   Endo/Other  negative endocrine ROS  Renal/GU negative Renal ROS  negative genitourinary   Musculoskeletal negative musculoskeletal ROS (+)   Abdominal   Peds negative pediatric ROS (+)  Hematology negative hematology ROS (+)   Anesthesia Other Findings   Reproductive/Obstetrics negative OB ROS                             Anesthesia Physical Anesthesia Plan  ASA: III  Anesthesia Plan: General   Post-op Pain Management:    Induction: Intravenous  Airway Management Planned: Oral ETT  Additional Equipment:   Intra-op Plan:   Post-operative Plan: Extubation in OR  Informed Consent: I have reviewed the patients History and Physical, chart, labs and discussed the procedure including the risks, benefits and alternatives for the proposed anesthesia with the patient or authorized representative who has indicated his/her understanding and acceptance.   Dental advisory given  Plan Discussed with: CRNA and Surgeon  Anesthesia Plan Comments:         Anesthesia Quick Evaluation

## 2015-08-21 NOTE — Evaluation (Signed)
Physical Therapy Evaluation Patient Details Name: Amber [name] MRN: [id number] DOB: 1960-12-25 Today's Date: 08/21/2015   History of Present Illness  Pt admitted for L direct anterior THA and right trigger thumb release. PMHx: MI, breast CA, HTN, GERD, hypothyroidism, R THA 2013  Clinical Impression  Pt was very sleepy and lethargic throughout session. She also c/o nausea upon sitting and during ambulation that limited patient's activity tolerance. She presents with decreased L LE strength, ROM, and balance during dynamic activity. Education was provided on HEP, transferring only with staff, and POC. Pt will benefit from acute PT to improve L LE strength, ROM, and safety during functional mobility to decrease burden of care. When medically cleared, D/C to SNF is most appropriate given pt's decreased caregiver support.     Follow Up Recommendations SNF;Supervision/Assistance - 24 hour    Equipment Recommendations  Rolling walker with 5" wheels    Recommendations for Other Services       Precautions / Restrictions Precautions Precautions: Fall Restrictions Weight Bearing Restrictions: Yes LLE Weight Bearing: Weight bearing as tolerated      Mobility  Bed Mobility Overal bed mobility: Modified Independent             General bed mobility comments: use of rail   Transfers Overall transfer level: Needs assistance   Transfers: Sit to/from Stand Sit to Stand: Min guard         General transfer comment: cues for hand placement  Ambulation/Gait Ambulation/Gait assistance: Min guard Ambulation Distance (Feet): 40 Feet Assistive device: Rolling walker (2 wheeled) Gait Pattern/deviations: Step-to pattern;Decreased stride length   Gait velocity interpretation: Below normal speed for age/gender General Gait Details: cues for sequence, chair to follow, cues for breathing, looking up . Pt limited by nausea  Stairs            Wheelchair Mobility    Modified  [place] (Stroke Patients Only)       Balance Overall balance assessment: Needs assistance Sitting-balance support: Single extremity supported Sitting balance-Leahy Scale: Fair     Standing balance support: Bilateral upper extremity supported Standing balance-Leahy Scale: Poor Standing balance comment: use of RW when walking                             Pertinent Vitals/Pain Pain Assessment: Faces Faces Pain Scale: Hurts a little bit Pain Location: L Hip Pain Intervention(s): Monitored during session;Repositioned    Home Living Family/patient expects to be discharged to:: Skilled nursing facility Living Arrangements: Alone   Type of Home: House Home Access: Stairs to enter       Home Equipment: Kasandra Knudsen - single point      Prior Function Level of Independence: Independent               Hand Dominance        Extremity/Trunk Assessment   Upper Extremity Assessment: Overall WFL for tasks assessed           Lower Extremity Assessment: Overall WFL for tasks assessed      Cervical / Trunk Assessment: Normal  Communication   Communication: No difficulties  Cognition Arousal/Alertness: Lethargic;Suspect due to medications Behavior During Therapy: [facility] for tasks assessed/performed Overall Cognitive Status: Within Functional Limits for tasks assessed                      General Comments      Exercises Total Joint Exercises Quad Sets: AROM;Left;Seated;10 reps  Gluteal Sets: AROM;Seated;Both;10 reps Heel Slides: AROM;Left;10 reps;Supine Hip ABduction/ADduction: AROM;Seated;Both;10 reps      Assessment/Plan    PT Assessment Patient needs continued PT services  PT Diagnosis Difficulty walking;Acute pain   PT Problem List Decreased strength;Decreased activity tolerance;Decreased balance;Decreased mobility;Pain;Decreased knowledge of use of DME  PT Treatment Interventions DME instruction;Gait training;Functional mobility training;Stair  training;Patient/family education;Therapeutic activities;Therapeutic exercise   PT Goals (Current goals can be found in the Care Plan section) Acute Rehab PT Goals PT Goal Formulation: Patient unable to participate in goal setting (due to lethargy )    Frequency 7X/week   Barriers to discharge Decreased caregiver support      Co-evaluation               End of Session Equipment Utilized During Treatment: Gait belt Activity Tolerance: Patient tolerated treatment well Patient left: in chair;with call bell/phone within reach Nurse Communication: Mobility status         Time: [id number] PT Time Calculation (min) (ACUTE ONLY): 30 min   Charges:   PT Evaluation $Initial PT Evaluation Tier I: 1 Procedure PT Treatments $Gait Training: 8-22 mins   PT G CodesHaynes Bast 09/01/15, 1:38 PM Haynes Bast, SPT 01-Sep-2015 1:38 PM

## 2015-08-21 NOTE — Op Note (Signed)
PRE-OP DIAGNOSIS:  LEFT HIP DEGENERATIVE JOINT DISEASE and RIGHT TRIGGER THUMB POST-OP DIAGNOSIS: same PROCEDURE:  LEFT TOTAL HIP ARTHROPLASTY ANTERIOR APPROACH and RIGHT TRIGGER THUMB RELEASE ANESTHESIA:  General SURGEON:  Melrose Nakayama MD ASSISTANT:  Loni Dolly PA-C   INDICATIONS FOR PROCEDURE:  The patient is a 54 y.o. female with a long history of a painful hip.  This has persisted despite multiple conservative measures.  The patient has persisted with pain and dysfunction making rest and activity difficult.  A total hip replacement is offered as surgical treatment.  Informed operative consent was obtained after discussion of possible complications including reaction to anesthesia, infection, neurovascular injury, dislocation, DVT, PE, and death.  The importance of the postoperative rehab program to optimize result was stressed with the patient. She also had a trigger thumb issue which had been treated with injection and she wished to have this addressed at the same time.  SUMMARY OF FINDINGS AND PROCEDURE:  Under general anesthesia through a anterior approach an the Hana table a left THR was performed.  The patient had severe degenerative change and good bone quality.  We used DePuy components to replace the hip and these were size KA12 [place] femur capped with a +9 60mm ceramic hip ball.  On the acetabular side we used a size 50 Gription shell with a plus 4 neutral polyethylene liner.  We did not use a hole eliminator. I attempted to place the hole eliminator but it did not thread easily so we elected not to use it.  Loni Dolly PA-C assisted throughout and was invaluable to the completion of the case in that he helped position and retract while I performed the procedure.  He also closed simultaneously to help minimize OR time.  I used fluoroscopy throughout the case to check position of components and leg lengths and read all these views myself. Prior to this THR we performed the right trigger  thumb release releasing the stenosed A1 pulley.  DESCRIPTION OF PROCEDURE:  The patient was taken to the OR suite where general anesthetic was applied.  The patient was then positioned on the Hana table supine.  All bony prominences were appropriately padded.  Prep and drape was then performed in normal sterile fashion.  The patient was given kefzol preoperative antibiotic and an appropriate time out was performed.  We addressed the right thumb first.  I made a small incision at the thenar flexion crease and dissected down to the flexor tendon sheath.  This was incised longitudinally releasing the A1 pulley. The wound was irrigated and then skin was closed with nylon.  Xeroform was applied followed by a sterile dressing and coban wrap.  We then prepped the hip and took an anterior approach to the left hip.  Dissection was taken through adipose to the tensor fascia lata fascia.  This structure was incised longitudinally and we dissected in the intermuscular interval just medial to this muscle.  Cobra retractors were placed superior and inferior to the femoral neck superficial to the capsule.  A capsular incision was then made and the retractors were placed along the femoral neck.  Xray was brought in to get a good level for the femoral neck cut which was made with an oscillating saw and osteotome.  The femoral head was removed with a corkscrew.  The acetabulum was exposed and some labral tissues were excised. Reaming was taken to the inside wall of the pelvis and sequentially up to 1 mm smaller than the actual component.  A trial of components was done and then the aforementioned acetabular shell was placed in appropriate tilt and anteversion confirmed by fluoroscopy. The liner was placed along with the hole eliminator and attention was turned to the femur.  The leg was brought down and over into adduction and the elevator bar was used to raise the femur up gently in the wound.  The piriformis was released with  care taken to preserve the obturator internus attachment and all of the posterior capsule. The femur was reamed and then broached to the appropriate size.  A trial reduction was done and the aforementioned head and neck assembly gave [place] the best stability in extension with external rotation.  Leg lengths were felt to be about equal by fluoroscopic exam.  The trial components were removed and the wound irrigated.  We then placed the femoral component in appropriate anteversion.  The head was applied to a dry stem neck and the hip again reduced.  It was again stable in the aforementioned position.  The would was irrigated again followed by re-approximation of anterior capsule with ethibond suture. Tensor fascia was repaired with V-loc suture  followed by subcutaneous closure with #O and #2 undyed vicryl.  Skin was closed with subQ stitch and steristrips followed by a sterile dressing.  EBL and IOF can be obtained from anesthesia records.  DISPOSITION:  The patient was extubated in the OR and taken to PACU in stable condition to be admitted to the Orthopedic Surgery for appropriate post-op care to include perioperative antibiotics and DVT prophylaxis.

## 2015-08-21 NOTE — Clinical Social Work Placement (Signed)
   CLINICAL SOCIAL WORK PLACEMENT  NOTE  Date:  08/21/2015  Patient Details  Name: Amber [name] MRN: [id number] Date of Birth: 07-21-1961  Clinical Social Work is seeking post-discharge placement for this patient at the [place] level of care (*CSW will initial, date and re-position this form in  chart as items are completed):  Yes   Patient/family provided with [place] Work Department's list of facilities offering this level of care within the geographic area requested by the patient (or if unable, by the patient's family).  Yes   Patient/family informed of their freedom to choose among providers that offer the needed level of care, that participate in Medicare, Medicaid or managed care program needed by the patient, have an available bed and are willing to accept the patient.  Yes   Patient/family informed of [facility]'s ownership interest in [facility] and [facility], as well as of the fact that they are under no obligation to receive care at these facilities.  PASRR submitted to EDS on 08/21/15     PASRR number received on 08/21/15     Existing PASRR number confirmed on       FL2 transmitted to all facilities in geographic area requested by pt/family on 08/21/15     FL2 transmitted to all facilities within larger geographic area on 08/21/15     Patient informed that his/her managed care company has contracts with or will negotiate with certain facilities, including the following:            Patient/family informed of bed offers received.  Patient chooses bed at       Physician recommends and patient chooses bed at      Patient to be transferred to   on  .  Patient to be transferred to facility by       Patient family notified on   of transfer.  Name of family member notified:        PHYSICIAN Please sign FL2     Additional Comment:    _______________________________________________ Ross Ludwig,  LCSWA 08/21/2015, 5:20 PM

## 2015-08-21 NOTE — Anesthesia Procedure Notes (Signed)
Procedure Name: Intubation Date/Time: 08/21/2015 7:35 AM Performed by: Clearnce Sorrel Pre-anesthesia Checklist: Patient identified, Emergency Drugs available, Suction available, Patient being monitored and Timeout performed Patient Re-evaluated:Patient Re-evaluated prior to inductionOxygen Delivery Method: Circle system utilized Preoxygenation: Pre-oxygenation with 100% oxygen Intubation Type: IV induction Ventilation: Mask ventilation without difficulty Laryngoscope Size: Mac and 3 Grade View: Grade I Tube type: Oral Tube size: 7.0 mm Number of attempts: 1 Airway Equipment and Method: Stylet Placement Confirmation: ETT inserted through vocal cords under direct vision,  positive ETCO2 and breath sounds checked- equal and bilateral Secured at: 22 cm Tube secured with: Tape Dental Injury: Teeth and Oropharynx as per pre-operative assessment

## 2015-08-21 NOTE — NC FL2 (Signed)
[place] LEVEL OF CARE SCREENING TOOL     IDENTIFICATION  Patient Name: Amber [name] Birthdate: October 12, 1960 Sex: female Admission Date (Current Location): 08/21/2015  [facility] and [place] Number:  [employer] and Address:  [facility] [place], [place] [address], [place], [place] [address]      Provider Number: [id number]  Attending Physician Name and Address:  Melrose Nakayama, MD  Relative Name and Phone Number:  NAVY DESAUTELS [facility]) Mother [phone] and sister Kaelene Weilbacher Sister [phone]    Current Level of Care: Hospital Recommended Level of Care: [place] Prior Approval Number:    Date Approved/Denied:   PASRR Number: [id number]  Discharge Plan: SNF    Current Diagnoses: Patient Active Problem List   Diagnosis Date Noted  . Primary localized osteoarthritis of left hip 08/21/2015  . Primary osteoarthritis of left hip 08/21/2015  . Tobacco abuse 08/15/2015  . High risk medication use 08/15/2015  . Osteopenia determined by x-ray 08/15/2015  . Malignant neoplasm of central portion of right female breast ([place]) 08/15/2015  . Old myocardial infarction 11/03/2013  . Coronary atherosclerosis of native coronary artery 11/03/2013  . Mixed hyperlipidemia 11/03/2013  . Tobacco use disorder 11/03/2013  . Breast cancer ([place])   . History of radiation therapy   . DJD (degenerative joint disease) of hip 08/17/2012    Class: Chronic  . Cancer of lower-inner quadrant of female breast ([place]) 06/22/2012    Orientation RESPIRATION BLADDER Height & Weight    Self, Time, Situation, Place  O2 (2 L/min) Continent 5\' 5"  (165.1 cm) 147 lbs.  BEHAVIORAL SYMPTOMS/MOOD NEUROLOGICAL BOWEL NUTRITION STATUS      Continent Diet (Regular)  AMBULATORY STATUS COMMUNICATION OF NEEDS Skin   Limited Assist Verbally Surgical wounds                       Personal Care Assistance Level of Assistance  Bathing, Dressing  Bathing Assistance: Limited assistance   Dressing Assistance: Limited assistance     Functional Limitations Info             SPECIAL CARE FACTORS FREQUENCY  PT (By licensed PT)     PT Frequency: 5x              Contractures      Additional Factors Info  Allergies   Allergies Info: TRAMADOL, BENZOYL PEROXIDE, BETADINE           Current Medications (08/21/2015):  This is the current hospital active medication list Current Facility-Administered Medications  Medication Dose Route Frequency Provider Last Rate Last Dose  . acetaminophen (TYLENOL) tablet 650 mg  650 mg Oral Q6H PRN Loni Dolly, PA-C       Or  . acetaminophen (TYLENOL) suppository 650 mg  650 mg Rectal Q6H PRN Loni Dolly, PA-C      . alum & mag hydroxide-simeth (MAALOX/MYLANTA) 200-200-20 MG/5ML suspension 30 mL  30 mL Oral Q4H PRN Loni Dolly, PA-C      . anastrozole (ARIMIDEX) tablet 1 mg  1 mg Oral Daily Loni Dolly, PA-C      . [START ON 08/22/2015] aspirin EC tablet 325 mg  325 mg Oral BID PC Loni Dolly, PA-C      . Derrill Memo ON 08/22/2015] atenolol (TENORMIN) tablet 25 mg  25 mg Oral Daily Loni Dolly, PA-C      . bisacodyl (DULCOLAX) EC tablet 5 mg  5 mg Oral Daily PRN Loni Dolly, PA-C      .  ceFAZolin (ANCEF) IVPB 2 g/50 mL premix  2 g Intravenous Q6H Loni Dolly, PA-C      . cyclobenzaprine (FLEXERIL) tablet 5 mg  5 mg Oral TID PRN Loni Dolly, PA-C      . diphenhydrAMINE (BENADRYL) 12.5 MG/5ML elixir 12.5-25 mg  12.5-25 mg Oral Q4H PRN Loni Dolly, PA-C      . docusate sodium (COLACE) capsule 100 mg  100 mg Oral BID Loni Dolly, PA-C      . ezetimibe (ZETIA) tablet 10 mg  10 mg Oral Daily Loni Dolly, PA-C      . HYDROcodone-acetaminophen (NORCO/VICODIN) 5-325 MG per tablet 1-2 tablet  1-2 tablet Oral Q4H PRN Loni Dolly, PA-C   2 tablet at 08/21/15 1645  . HYDROmorphone (DILAUDID) 1 MG/ML injection           . HYDROmorphone (DILAUDID) 1 MG/ML injection           . HYDROmorphone (DILAUDID)  injection 0.5-1 mg  0.5-1 mg Intravenous Q3H PRN Loni Dolly, PA-C      . lactated ringers infusion   Intravenous Continuous Loni Dolly, PA-C      . [START ON 08/22/2015] levothyroxine (SYNTHROID, LEVOTHROID) tablet 88 mcg  88 mcg Oral QAC breakfast Loni Dolly, PA-C      . Derrill Memo ON 08/22/2015] lisinopril (PRINIVIL,ZESTRIL) tablet 5 mg  5 mg Oral Daily Loni Dolly, PA-C      . menthol-cetylpyridinium (CEPACOL) lozenge 3 mg  1 lozenge Oral PRN Loni Dolly, PA-C       Or  . phenol (CHLORASEPTIC) mouth spray 1 spray  1 spray Mouth/Throat PRN Loni Dolly, PA-C      . methocarbamol (ROBAXIN) tablet 500 mg  500 mg Oral Q6H PRN Loni Dolly, PA-C       Or  . methocarbamol (ROBAXIN) 500 mg in dextrose 5 % 50 mL IVPB  500 mg Intravenous Q6H PRN Loni Dolly, PA-C      . metoCLOPramide (REGLAN) tablet 5-10 mg  5-10 mg Oral Q8H PRN Loni Dolly, PA-C       Or  . metoCLOPramide (REGLAN) injection 5-10 mg  5-10 mg Intravenous Q8H PRN Loni Dolly, PA-C      . nitroGLYCERIN (NITROSTAT) SL tablet 0.4 mg  0.4 mg Sublingual Q5 min PRN Loni Dolly, PA-C      . ondansetron [facility]) tablet 4 mg  4 mg Oral Q6H PRN Loni Dolly, PA-C       Or  . ondansetron [facility]) injection 4 mg  4 mg Intravenous Q6H PRN Loni Dolly, PA-C   4 mg at 08/21/15 1330  . [START ON 08/22/2015] rosuvastatin (CRESTOR) tablet 20 mg  20 mg Oral QODAY Loni Dolly, PA-C      . zolpidem (AMBIEN) tablet 5 mg  5 mg Oral QHS PRN Loni Dolly, PA-C         Discharge Medications: Please see discharge summary for a list of discharge medications.  Relevant Imaging Results:  Relevant Lab Results:   Additional Information SSN [id number]  Ross Ludwig, [place]

## 2015-08-21 NOTE — Care Management Note (Signed)
Case Management Note  Patient Details  Name: Amber [name] MRN: [id number] Date of Birth: [birth year]  Subjective/Objective:     54 yr old female s/p left total knee arthroplasty, right trigger thumb release               Action/Plan:  Patient is for shortterm rehab at [facility] Social worker notified.   Expected Discharge Date:   to be determined               Expected Discharge Plan:  Skilled Nursing Facility  In-House Referral:  Clinical Social Work  Discharge planning Services     Post Acute Care Choice:    Choice offered to:     DME Arranged:    DME Agency:     HH Arranged:    [place] Agency:     Status of Service:  In process, will continue to follow  Medicare Important Message Given:    Date Medicare IM Given:    Medicare IM give by:    Date Additional Medicare IM Given:    Additional Medicare Important Message give by:     If discussed at [place] of Stay Meetings, dates discussed:    Additional Comments:  Ninfa Meeker, RN 08/21/2015, 1:49 PM

## 2015-08-21 NOTE — Interval H&P Note (Signed)
OK for surgery PD 

## 2015-08-21 NOTE — Clinical Social Work Note (Signed)
CSW received consult for SNF placement, patient states she is preregistered for [facility], and would like to go to SNF for short term rehab.  CSW faxed clinicals to [facility], awaiting response back and insurance approval.  Jones Broom. [place], MSW, [place] 08/21/2015 5:23 PM

## 2015-08-21 NOTE — Clinical Social Work Note (Signed)
Clinical Social Work Assessment  Patient Details  Name: ASHAY BARRERE MRN: [id number] Date of Birth: [birth year]  Date of referral:  08/21/15               Reason for consult:  Facility Placement                Permission sought to share information with:  Facility Sport and exercise psychologist Permission granted to share information::  Yes, Verbal Permission Granted  Name::     JODIE-LEE EHMKE Joaquim Lai) Mother [phone] and sister Keryl Bredesen [id number]  Agency::  SNF admissions  Relationship::     Contact Information:     Housing/Transportation Living arrangements for the past 2 months:  Single Family Home Source of Information:  Patient Patient Interpreter Needed:  None Criminal Activity/Legal Involvement Pertinent to Current Situation/Hospitalization:  No - Comment as needed Significant Relationships:  Siblings, Parents Lives with:  Self Do you feel safe going back to the place where you live?  Yes (Patient expressed she feels safe to return back home once she has received some therapy) Need for family participation in patient care:  No (Coment)  Care giving concerns:  Patient feels she needs some short term rehab before she can safely return back home.   Social Worker assessment / plan:  Patient is a 54 year old female who lives alone, patient is alert and oriented x4 and able to make her own decisions.  Patient states she has never been to rehab for therapy, CSW briefly explained to patient what to expect and the procedure for SNF placement.  Patient stated she lives alone and would like to get some therapy before she is able to return back home.  Patient expressed that she still works full time and is hopeful that she will recover quickly.  Patient states she would like to go to [facility] and has preregistered and spoke to [place] about going there once she is medically ready.  Patient asked questions which were answered appropriately, patient expressed she did not have  any other questions Employment status:  [employer] information:  Managed Care PT Recommendations:  [place] / Referral to community resources:  [place]  Patient/Family's Response to care:  Patient expressed that she is hopefull to go to [facility] for rehab.  Patient/Family's Understanding of and Emotional Response to Diagnosis, Current Treatment, and Prognosis:  Patient aware of current diagnosis and treatment plan.  Emotional Assessment Appearance:  Appears stated age Attitude/Demeanor/Rapport:    Affect (typically observed):  Appropriate, Calm, Pleasant Orientation:  Oriented to Self, Oriented to Place, Oriented to  Time, Oriented to Situation Alcohol / Substance use:  Not Applicable Psych involvement (Current and /or in the community):  No (Comment)  Discharge Needs  Concerns to be addressed:  No discharge needs identified Readmission within the last 30 days:  No Current discharge risk:  Lack of support system, Lives alone Barriers to Discharge:  Insurance Authorization   Anell Barr 08/21/2015, 5:10 PM

## 2015-08-21 NOTE — Transfer of Care (Signed)
Immediate Anesthesia Transfer of Care Note  Patient: Amber [name]  Procedure(s) Performed: Procedure(s): TOTAL HIP ARTHROPLASTY ANTERIOR APPROACH AND RIGHT TRIGGER THUMB RELEASE (Left)  Patient Location: PACU  Anesthesia Type:General  Level of Consciousness: awake, alert  and oriented  Airway & Oxygen Therapy: Patient Spontanous Breathing and Patient connected to nasal cannula oxygen  Post-op Assessment: Report given to RN and Post -op Vital signs reviewed and stable  Post vital signs: Reviewed and stable  Last Vitals:  Filed Vitals:   08/21/15 0557  BP: 139/57  Pulse: 51  Temp: 36.8 C  Resp: 20    Complications: No apparent anesthesia complications

## 2015-08-21 NOTE — Anesthesia Postprocedure Evaluation (Signed)
Anesthesia Post Note  Patient: Amber [name]  Procedure(s) Performed: Procedure(s) (LRB): TOTAL HIP ARTHROPLASTY ANTERIOR APPROACH AND RIGHT TRIGGER THUMB RELEASE (Left)  Patient location during evaluation: PACU Anesthesia Type: General Level of consciousness: awake and alert Pain management: pain level controlled Vital Signs Assessment: post-procedure vital signs reviewed and stable Respiratory status: spontaneous breathing and respiratory function stable Cardiovascular status: blood pressure returned to baseline and stable Postop Assessment: no signs of nausea or vomiting Anesthetic complications: no    Last Vitals:  Filed Vitals:   08/21/15 0557 08/21/15 1030  BP: 139/57 121/52  Pulse: 51 52  Temp: 36.8 C 36.4 C  Resp: 20 12    Last Pain:  Filed Vitals:   08/21/15 1114  PainSc: 7                  AmeLie Hollars S

## 2015-08-22 ENCOUNTER — Encounter (HOSPITAL_COMMUNITY): Payer: Self-pay | Admitting: General Practice

## 2015-08-22 NOTE — Progress Notes (Signed)
Subjective: 1 Day Post-Op Procedure(s) (LRB): TOTAL HIP ARTHROPLASTY ANTERIOR APPROACH AND RIGHT TRIGGER THUMB RELEASE (Left)  Activity level:  wbat Diet tolerance:  ok Voiding:  ok Patient reports pain as mild.    Objective: Vital signs in last 24 hours: Temp:  [97.1 F (36.2 C)-100 F (37.8 C)] 99.3 F (37.4 C) (12/21 0409) Pulse Rate:  [48-82] 82 (12/21 0409) Resp:  [10-16] 15 (12/21 0409) BP: (85-121)/(38-54) 111/54 mmHg (12/21 0409) SpO2:  [94 %-100 %] 96 % (12/21 0409)  Labs: No results for input(s): HGB in the last 72 hours. No results for input(s): WBC, RBC, HCT, PLT in the last 72 hours. No results for input(s): NA, K, CL, CO2, BUN, CREATININE, GLUCOSE, CALCIUM in the last 72 hours. No results for input(s): LABPT, INR in the last 72 hours.  Physical Exam:  Neurologically intact ABD soft Neurovascular intact Sensation intact distally Intact pulses distally Dorsiflexion/Plantar flexion intact Incision: dressing C/D/I and no drainage No cellulitis present Compartment soft  Assessment/Plan:  1 Day Post-Op Procedure(s) (LRB): TOTAL HIP ARTHROPLASTY ANTERIOR APPROACH AND RIGHT TRIGGER THUMB RELEASE (Left) Advance diet Up with therapy D/C IV fluids Plan for discharge tomorrow Discharge to SNF if doing well and cleared by PT. Continue on ASA 325mg  BID x 4 weeks post op. I will change dressing on hand prior to discharge. Follow up in office 2 weeks post op.  Amber [name], Amber [name] 08/22/2015, 8:09 AM

## 2015-08-22 NOTE — Progress Notes (Signed)
Physical Therapy Treatment Patient Details Name: ILYANA ELWARD MRN: [id number] DOB: May 23, 1961 Today's Date: 08/22/2015    History of Present Illness Pt admitted for L direct anterior THA and right trigger thumb release. PMHx: MI, breast CA, HTN, GERD, hypothyroidism, R THA 2013    PT Comments    Progressing well with mobility.   Follow Up Recommendations  SNF     Equipment Recommendations  Rolling walker with 5" wheels    Recommendations for Other Services       Precautions / Restrictions Precautions Precautions: Fall Restrictions Weight Bearing Restrictions: No LLE Weight Bearing: Weight bearing as tolerated    Mobility  Bed Mobility Overal bed mobility: Needs Assistance Bed Mobility: Sit to Supine       Sit to supine: Min assist   General bed mobility comments: small amount of assist for L LE onto bed.  Transfers Overall transfer level: Needs assistance Equipment used: Rolling walker (2 wheeled) Transfers: Sit to/from Stand Sit to Stand: Supervision         General transfer comment: VCs safety, hand placement. Increased time  Ambulation/Gait Ambulation/Gait assistance: Min guard Ambulation Distance (Feet): 125 Feet Assistive device: Rolling walker (2 wheeled) Gait Pattern/deviations: Step-to pattern;Step-through pattern     General Gait Details: close guard for safety. Pt tolerated increased distance well. slow but steady.    Stairs            Wheelchair Mobility    Modified Rankin (Stroke Patients Only)       Balance Overall balance assessment: Needs assistance Sitting-balance support: Feet supported Sitting balance-Leahy Scale: Good     Standing balance support: No upper extremity supported;During functional activity Standing balance-Leahy Scale: Fair                      Cognition Arousal/Alertness: Awake/alert Behavior During Therapy: WFL for tasks assessed/performed Overall Cognitive Status: Within Functional  Limits for tasks assessed                      Exercises Total Joint Exercises Ankle Circles/Pumps: AROM;Both;10 reps;Supine Quad Sets: AROM;Both;10 reps;Supine Hip ABduction/ADduction: AAROM;Left;10 reps;Supine Long Arc Quad: Left;AROM;Seated    General Comments General comments (skin integrity, edema, etc.): Pt found in room attempting to get to the bathroom without assist and without use of RW; pt educated on need for assist and use of RW.      Pertinent Vitals/Pain Pain Assessment: 0-10 Pain Score: 5  Pain Location: L hip/thigh Pain Descriptors / Indicators: Sore Pain Intervention(s): Monitored during session;Repositioned;Ice applied    Home Living Family/patient expects to be discharged to:: Skilled nursing facility Living Arrangements: Alone           Home Equipment: Kasandra Knudsen - single point      Prior Function Level of Independence: Independent          PT Goals (current goals can now be found in the care plan section) Acute Rehab PT Goals Patient Stated Goal: to go to rehab then home Progress towards PT goals: Progressing toward goals    Frequency  7X/week    PT Plan Current plan remains appropriate    Co-evaluation             End of Session   Activity Tolerance: Patient tolerated treatment well Patient left: in bed;with call bell/phone within reach     Time: [id number] PT Time Calculation (min) (ACUTE ONLY): 22 min  Charges:  $Gait Training: 8-22 mins  G Codes:      Weston Anna, MPT Pager: [phone]

## 2015-08-22 NOTE — Evaluation (Signed)
Occupational Therapy Evaluation Patient Details Name: Amber [name] MRN: [id number] DOB: [birth year] Today's Date: 08/22/2015    History of Present Illness Pt admitted for L direct anterior THA and right trigger thumb release. PMHx: MI, breast CA, HTN, GERD, hypothyroidism, R THA 2013   Clinical Impression   Pt reports she was independent with ADLs PTA. Currently pt is overall min guard for ADLs and functional mobility with the exception of min assist for LB ADLs. Pt was found in room attempting to ambulate to the bathroom without RW or staff assist (RN notified); educated pt on need for assist for safety and use of call button; she verbalized understanding. Began safety and ADL education with pt; she verbalized understanding. Pt planning d/c to SNF [facility]); would agree with SNF placement at this time for further rehab prior to return home. Pt currently with no acute OT needs; will defer all further OT needs to next venue of care. Thank you for this referral.    Follow Up Recommendations  SNF;Supervision/Assistance - 24 hour ([place])    Equipment Recommendations  Other (comment) (defer to next venue)    Recommendations for Other Services       Precautions / Restrictions Precautions Precautions: Fall Restrictions Weight Bearing Restrictions: Yes LLE Weight Bearing: Weight bearing as tolerated      Mobility Bed Mobility               General bed mobility comments: Pt OOB in chair  Transfers Overall transfer level: Needs assistance Equipment used: Rolling walker (2 wheeled) Transfers: Sit to/from Stand Sit to Stand: Min guard         General transfer comment: Min guard for safety; no physical assist needed. Good hand placement and technique.    Balance Overall balance assessment: Needs assistance Sitting-balance support: Feet supported Sitting balance-Leahy Scale: Good     Standing balance support: No upper extremity supported;During functional  activity Standing balance-Leahy Scale: Fair                              ADL Overall ADL's : Needs assistance/impaired Eating/Feeding: Set up;Sitting   Grooming: Wash/dry hands;Standing;Min guard       Lower Body Bathing: Minimal assistance;Sit to/from stand       Lower Body Dressing: Minimal assistance;Sit to/from stand Lower Body Dressing Details (indicate cue type and reason): Pt unable to don L sock. Educated on compensatory strategies for LB ADLs. Toilet Transfer: Min guard;Ambulation;BSC;RW   Toileting- Water quality scientist and Hygiene: Min guard;Sit to/from stand (for toilet hygiene)       Functional mobility during ADLs: Min guard;Rolling walker General ADL Comments: No family present for OT eval. Pt was found out of chair attempting to walk in room wihtout RW; pt educated on need for supervision for safety during functional mobility in room and use of call button; pt verbalized understanding.     Vision     Perception     Praxis      Pertinent Vitals/Pain Pain Assessment: 0-10 Pain Score: 4  Pain Location: L hip  Pain Descriptors / Indicators: Tightness;Sore Pain Intervention(s): Limited activity within patient's tolerance;Monitored during session;Repositioned;Ice applied     Hand Dominance Right   Extremity/Trunk Assessment Upper Extremity Assessment Upper Extremity Assessment: Overall WFL for tasks assessed   Lower Extremity Assessment Lower Extremity Assessment: Defer to PT evaluation   Cervical / Trunk Assessment Cervical / Trunk Assessment: Normal   Communication Communication Communication:  No difficulties   Cognition Arousal/Alertness: Awake/alert Behavior During Therapy: Impulsive Overall Cognitive Status: Within Functional Limits for tasks assessed                     General Comments       Exercises       Shoulder Instructions      Home Living Family/patient expects to be discharged to:: Skilled nursing  facility Living Arrangements: Alone                           Home Equipment: Cane - single point          Prior Functioning/Environment Level of Independence: Independent             OT Diagnosis: Acute pain   OT Problem List:     OT Treatment/Interventions:      OT Goals(Current goals can be found in the care plan section) Acute Rehab OT Goals Patient Stated Goal: to go to rehab then home OT Goal Formulation: With patient  OT Frequency:     Barriers to D/C:            Co-evaluation              End of Session Equipment Utilized During Treatment: Rolling walker Nurse Communication: Other (comment) (Pt attempting mobility in room without assist)  Activity Tolerance: Patient tolerated treatment well Patient left: in chair;with call bell/phone within reach   Time: 0913-0925 OT Time Calculation (min): 12 min Charges:  OT General Charges $OT Visit: 1 Procedure OT Evaluation $Initial OT Evaluation Tier I: 1 Procedure G-Codes:     Binnie Kand M.S., OTR/L Pager: [phone]  08/22/2015, 9:33 AM

## 2015-08-23 NOTE — Progress Notes (Signed)
Physical Therapy Treatment Patient Details Name: Amber [name] MRN: [id number] DOB: 05-28-1961 Today's Date: 2015/09/02    History of Present Illness Pt admitted for L direct anterior THA and right trigger thumb release. PMHx: MI, breast CA, HTN, GERD, hypothyroidism, R THA 2013    PT Comments    Pt continues to progress well with all transfers and gait. Educated pt for all concerns. Will continue follow.  Follow Up Recommendations  SNF     Equipment Recommendations  3in1 (PT);Rolling walker with 5" wheels    Recommendations for Other Services       Precautions / Restrictions Precautions Precautions: Fall Restrictions LLE Weight Bearing: Weight bearing as tolerated    Mobility  Bed Mobility Overal bed mobility: Modified Independent                Transfers Overall transfer level: Modified independent                  Ambulation/Gait Ambulation/Gait assistance: Supervision Ambulation Distance (Feet): 200 Feet Assistive device: Rolling walker (2 wheeled)     Gait velocity interpretation: Below normal speed for age/gender General Gait Details: cues for increased hip flexion LLE and normal sequencing of gait   Stairs            Wheelchair Mobility    Modified Rankin (Stroke Patients Only)       Balance                                    Cognition Arousal/Alertness: Awake/alert Behavior During Therapy: WFL for tasks assessed/performed Overall Cognitive Status: Within Functional Limits for tasks assessed                      Exercises Total Joint Exercises Gluteal Sets: AROM;Seated;Both;15 reps Heel Slides: AROM;Left;10 reps;Seated Hip ABduction/ADduction: AROM;15 reps;Seated;Both Straight Leg Raises: AAROM;5 reps;Left;Supine Long Arc Quad: AROM;Left;15 reps;Seated Marching in Standing: AROM;Both;15 reps;Seated    General Comments        Pertinent Vitals/Pain Pain Assessment: No/denies pain     Home Living                      Prior Function            PT Goals (current goals can now be found in the care plan section) Progress towards PT goals: Progressing toward goals    Frequency       PT Plan Current plan remains appropriate    Co-evaluation             End of Session Equipment Utilized During Treatment: Gait belt Activity Tolerance: Patient tolerated treatment well Patient left: in bed;with call bell/phone within reach     Time: [id number] PT Time Calculation (min) (ACUTE ONLY): 17 min  Charges:  $Gait Training: 8-22 mins                    G Codes:      Melford Aase 09/02/15, 2:28 PM Elwyn Reach, [place]

## 2015-08-23 NOTE — Progress Notes (Signed)
Physical Therapy Treatment Patient Details Name: Amber [name] MRN: [id number] DOB: 1960-09-12 Today's Date: 08/23/2015    History of Present Illness Pt admitted for L direct anterior THA and right trigger thumb release. PMHx: MI, breast CA, HTN, GERD, hypothyroidism, R THA 2013    PT Comments    Pt continues to progress her ability for bed mobility, transfers, and ambulation. She still has difficulties elevating L LE from floor to bed and performing hip flexion exercises. Will continue to follow to improve L LE strength/ROM and functional mobility.   Follow Up Recommendations  SNF     Equipment Recommendations  Rolling walker with 5" wheels    Recommendations for Other Services       Precautions / Restrictions Precautions Precautions: Fall Restrictions Weight Bearing Restrictions: Yes LLE Weight Bearing: Weight bearing as tolerated    Mobility  Bed Mobility Overal bed mobility: Modified Independent Bed Mobility: Supine to Sit;Sit to Supine     Supine to sit: Modified independent (Device/Increase time) Sit to supine: Modified independent (Device/Increase time)   General bed mobility comments: Increased time for supine to sit. Pt still has difficulties lifting L LE into bed when going sit to supine. She was instructed on using the R LE to help lift the L LE. Practiced 3 trials and performed much faster with no assist needed.   Transfers Overall transfer level: Modified independent   Transfers: Sit to/from Stand Sit to Stand: Modified independent (Device/Increase time)         General transfer comment: Increased time.   Ambulation/Gait Ambulation/Gait assistance: Supervision Ambulation Distance (Feet): 350 Feet Assistive device: Rolling walker (2 wheeled) Gait Pattern/deviations: Step-through pattern Gait velocity: Slightly slower than normal and improving   General Gait Details: Supervision for safety but pt was able to maintain steadiness when walking  backwards and turning head side to side while ambulating. Walked in room without RW but pt preferred using RW for support   Stairs Stairs: Yes Stairs assistance: Modified independent (Device/Increase time) Stair Management: Two rails;Step to pattern;Forwards Number of Stairs: 4 General stair comments: 2 steps x2. Pt requested to practice stairs. She performed well with no physical assist. VC for sequencing.   Wheelchair Mobility    Modified Rankin (Stroke Patients Only)       Balance Overall balance assessment: Needs assistance Sitting-balance support: No upper extremity supported Sitting balance-Leahy Scale: Good     Standing balance support: No upper extremity supported;During functional activity Standing balance-Leahy Scale: Fair Standing balance comment: Preferred RW when ambulating                    Cognition Arousal/Alertness: Awake/alert Behavior During Therapy: WFL for tasks assessed/performed Overall Cognitive Status: Within Functional Limits for tasks assessed                      Exercises Total Joint Exercises Gluteal Sets: AROM;Seated;Both;15 reps Heel Slides: AROM;Left;10 reps;Seated Hip ABduction/ADduction: AROM;15 reps;Seated;Both Straight Leg Raises: AAROM;5 reps;Left;Seated Long Arc Quad: AROM;Left;15 reps;Seated Marching in Standing: AROM;Both;15 reps;Seated    General Comments General comments (skin integrity, edema, etc.): Pt reminded to only transfer/walk with staff      Pertinent Vitals/Pain Pain Assessment: 0-10 Pain Score: 2  Pain Location: L thigh Pain Descriptors / Indicators: Sore Pain Intervention(s): Monitored during session;Repositioned;Ice applied    Home Living                      Prior Function  PT Goals (current goals can now be found in the care plan section) Progress towards PT goals: Progressing toward goals    Frequency  7X/week    PT Plan Current plan remains appropriate     Co-evaluation             End of Session Equipment Utilized During Treatment: Gait belt Activity Tolerance: Patient tolerated treatment well Patient left: in chair;with call bell/phone within reach     Time: 0846-0910 PT Time Calculation (min) (ACUTE ONLY): 24 min  Charges:  $Gait Training: 8-22 mins $Therapeutic Exercise: 8-22 mins                    G CodesHaynes Bast 08/30/15, 10:14 AM Haynes Bast, SPT 08-30-2015 10:14 AM

## 2015-08-23 NOTE — Clinical Social Work Note (Addendum)
CSW received phone call from [facility] who said patient's Visual merchandiser has denied patient's admission to SNF.  CSW left message with physician's office to inform them that patient's insurance company denied.  CSW awaiting call back from physician, [place] notified case manager as well.  4:55 pm CSW received phone call back from PA Loni Dolly, requesting CSW fax [employer] insurance denial information to physician office in order for physician to attempt to appeal insurance company's decision.  CSW was given verbal permission from patient to have physician contact medical director of insurance company to discuss denial.  CSW faxed denial letter to physician's office.   Jones Broom. [place], MSW, [place] 08/23/2015 4:28 PM

## 2015-08-23 NOTE — Progress Notes (Signed)
Subjective: 2 Days Post-Op Procedure(s) (LRB): TOTAL HIP ARTHROPLASTY ANTERIOR APPROACH AND RIGHT TRIGGER THUMB RELEASE (Left)  Activity level:  wbat Diet tolerance:  ok Voiding:  ok Patient reports pain as mild.    Objective: Vital signs in last 24 hours: Temp:  [99.3 F (37.4 C)-99.4 F (37.4 C)] 99.4 F (37.4 C) (12/22 0422) Pulse Rate:  [77-83] 83 (12/22 0422) Resp:  [16-18] 18 (12/22 0422) BP: (131-140)/(57-61) 131/57 mmHg (12/22 0422) SpO2:  [97 %-100 %] 100 % (12/22 0422)  Labs: No results for input(s): HGB in the last 72 hours. No results for input(s): WBC, RBC, HCT, PLT in the last 72 hours. No results for input(s): NA, K, CL, CO2, BUN, CREATININE, GLUCOSE, CALCIUM in the last 72 hours. No results for input(s): LABPT, INR in the last 72 hours.  Physical Exam:  Neurologically intact ABD soft Neurovascular intact Sensation intact distally Intact pulses distally Dorsiflexion/Plantar flexion intact Incision: dressing C/D/I and no drainage No cellulitis present Compartment soft  Assessment/Plan:  2 Days Post-Op Procedure(s) (LRB): TOTAL HIP ARTHROPLASTY ANTERIOR APPROACH AND RIGHT TRIGGER THUMB RELEASE (Left) Advance diet Up with therapy Discharge to SNF either today or tomorrow pending insurance approval. She has no family of friends around so she would be a high fall risk if she were to go home. Continue on asa 325mg  BID x 4 weeks.  Follow up in office 2 weeks post op.    Narya Beavin, [name] 08/23/2015, 3:00 PM

## 2015-08-23 NOTE — Clinical Social Work Note (Addendum)
CSW received phone call from [facility] who stated the patient's insurance company has not approved patient for SNF yet and the insurance company sent request to their medical director to make decision if patient can be approved or not.  CSW updated patient and case manager that insurance company is pending decision and they will notify SNF who will inform CSW.  CSW explained to patient that if insurance company does not approve patient, then she will have to consider going home with home health physician and case manager made aware.  Jones Broom. [place], MSW, [place] 08/23/2015 1:32 PM

## 2015-08-23 NOTE — Progress Notes (Signed)
RT note: Pt. placed on CPAP @ 4 cmh20 per comfort, on room air with her own Nasal Pillows, tolerating well, RT to monitor.

## 2015-08-24 MED ORDER — ASPIRIN 325 MG PO TBEC: 325.0000 mg | DELAYED_RELEASE_TABLET | Freq: Two times a day (BID) | ORAL | Status: DC

## 2015-08-24 MED ORDER — METHOCARBAMOL 500 MG PO TABS: 500.0000 mg | ORAL_TABLET | Freq: Four times a day (QID) | ORAL | Status: DC | PRN

## 2015-08-24 MED ORDER — HYDROCODONE-ACETAMINOPHEN 5-325 MG PO TABS: 1.0000 | ORAL_TABLET | ORAL | Status: DC | PRN

## 2015-08-24 NOTE — Progress Notes (Signed)
Physical Therapy Treatment Patient Details Name: Amber [name] MRN: [id number] DOB: Nov 11, 1960 Today's Date: 08/24/2015    History of Present Illness Pt admitted for L direct anterior THA and right trigger thumb release. PMHx: MI, breast CA, HTN, GERD, hypothyroidism, R THA 2013    PT Comments    [name] continues to move exceptionally well. She was in chair after bathing and dressing herself this morning, able to perform long hall ambulation, stairs and standing HEP. Pt is aware and agreeable to D/C plan to home. Pt educated for decreasing quadratus lumborum compensation on Left for hip activation and encouraged to be in front of a mirror for HEP. Will continue to follow but safe for D/C home.   Follow Up Recommendations  Home health PT     Equipment Recommendations  3in1 (PT);Rolling walker with 5" wheels    Recommendations for Other Services       Precautions / Restrictions Precautions Precautions: Fall Restrictions LLE Weight Bearing: Weight bearing as tolerated    Mobility  Bed Mobility               General bed mobility comments: in chair on arrival  Transfers Overall transfer level: Modified independent                  Ambulation/Gait Ambulation/Gait assistance: Supervision Ambulation Distance (Feet): 350 Feet Assistive device: Rolling walker (2 wheeled) Gait Pattern/deviations: Step-through pattern;Decreased stride length   Gait velocity interpretation: Below normal speed for age/gender General Gait Details: decreased hip flexion on LLE with quadratus lumborum activation, cues for sequence and increased hip flexion   Stairs Stairs: Yes Stairs assistance: Modified independent (Device/Increase time) Stair Management: Step to pattern;Forwards Number of Stairs: 4 General stair comments: 1 rail, pt performed well  Wheelchair Mobility    Modified Rankin (Stroke Patients Only)       Balance                                    Cognition Arousal/Alertness: Awake/alert Behavior During Therapy: WFL for tasks assessed/performed Overall Cognitive Status: Within Functional Limits for tasks assessed                      Exercises Total Joint Exercises Hip ABduction/ADduction: AROM;Standing;Both;20 reps Knee Flexion: AROM;Standing;Left;20 reps Marching in Standing: AROM;Both;20 reps    General Comments        Pertinent Vitals/Pain Pain Score: 2  Pain Location: left hip Pain Descriptors / Indicators: Sore Pain Intervention(s): Repositioned;Monitored during session;Limited activity within patient's tolerance;Ice applied    Home Living                      Prior Function            PT Goals (current goals can now be found in the care plan section) Progress towards PT goals: Progressing toward goals    Frequency       PT Plan Discharge plan needs to be updated    Co-evaluation             End of Session   Activity Tolerance: Patient tolerated treatment well Patient left: in chair;with call bell/phone within [name];with nursing/sitter in room     Time: 0712-0730 PT Time Calculation (min) (ACUTE ONLY): 18 min  Charges:  $Gait Training: 8-22 mins  G CodesMelford [name] 09-07-15, 7:36 AM Amber [name], Amber [name]

## 2015-08-24 NOTE — Discharge Summary (Signed)
Patient ID: Amber [name] MRN: [id number] DOB/AGE: [birth year] 54 y.o.  Admit date: 08/21/2015 Discharge date: 08/24/2015  Admission Diagnoses:  Principal Problem:   Primary localized osteoarthritis of left hip Active Problems:   Primary osteoarthritis of left hip   Discharge Diagnoses:  Same  Past Medical History  Diagnosis Date  . Hyperlipidemia   . Osteoporosis   . Hypertension     Dr. [facility]  . GERD (gastroesophageal reflux disease)     hx of   . Neuromuscular disorder ([place])     carpal tunnel right hand  . Chronic neck pain     hx of  . Hypothyroidism   . Heart attack [facility]) 2001    Dr. Scarlette Calico  . Anginal pain ([place]) 2001  . Pernicious anemia   . Sickle cell trait ([place])   . Migraines     "last one was 1990's" (08/17/2012)  . DJD (degenerative joint disease) of hip     bil hip  . Breast cancer ([place])     invasive ductal carcinoma right breast; ER, PR + and HER 2 -  . History of radiation therapy 09/16/12- 10/19/12    right breast  . Tobacco abuse   . Allergic rhinitis   . Sleep apnea     does use a cpap  . Wears glasses     Surgeries: Procedure(s): TOTAL HIP ARTHROPLASTY ANTERIOR APPROACH AND RIGHT TRIGGER THUMB RELEASE on 08/21/2015   Consultants:    Discharged Condition: Improved  Hospital Course: GREETA [name] is an 54 y.o. female who was admitted 08/21/2015 for operative treatment ofPrimary localized osteoarthritis of left hip. Patient has severe unremitting pain that affects sleep, daily activities, and work/hobbies. After pre-op clearance the patient was taken to the operating room on 08/21/2015 and underwent  Procedure(s): TOTAL HIP ARTHROPLASTY ANTERIOR APPROACH AND RIGHT TRIGGER THUMB RELEASE.    Patient was given perioperative antibiotics: Anti-infectives    Start     Dose/Rate Route Frequency Ordered Stop   08/21/15 1330  ceFAZolin (ANCEF) IVPB 2 g/50 mL premix     2 g 100 mL/hr over 30 Minutes Intravenous  Every 6 hours 08/21/15 1205 08/22/15 0140   08/21/15 0700  ceFAZolin (ANCEF) 3 g in dextrose 5 % 50 mL IVPB     3 g 160 mL/hr over 30 Minutes Intravenous To [facility] 08/20/15 1404 08/21/15 0737       Patient was given sequential compression devices, early ambulation, and chemoprophylaxis to prevent DVT.  Patient benefited maximally from hospital stay and there were no complications.    Recent vital signs: Patient Vitals for the past 24 hrs:  BP Temp Temp src Pulse Resp SpO2  08/24/15 0528 (!) 118/50 mmHg 98.8 F (37.1 C) Oral 87 18 99 %  08/23/15 2120 - - - 80 18 98 %  08/23/15 2022 (!) 117/54 mmHg 98.9 F (37.2 C) Oral 83 18 97 %  08/23/15 1613 129/62 mmHg 99.9 F (37.7 C) Oral 71 18 99 %     Recent laboratory studies: No results for input(s): WBC, HGB, HCT, PLT, NA, K, CL, CO2, BUN, CREATININE, GLUCOSE, INR, CALCIUM in the last 72 hours.  Invalid input(s): PT, 2   Discharge Medications:     Medication List    STOP taking these medications        aspirin 81 MG tablet  Replaced by:  aspirin 325 MG EC tablet     meloxicam 7.5 MG tablet  Commonly known as:  MOBIC      TAKE these medications        alendronate 70 MG tablet  Commonly known as:  FOSAMAX  TAKE ONE TABLET BY MOUTH ONCE A WEEK ON  SATURDAY     amoxicillin 500 MG capsule  Commonly known as:  AMOXIL  Take 500 mg by mouth as directed.     anastrozole 1 MG tablet  Commonly known as:  ARIMIDEX  Take 1 tablet (1 mg total) by mouth daily.     aspirin 325 MG EC tablet  Take 1 tablet (325 mg total) by mouth 2 (two) times daily after a meal.     atenolol 25 MG tablet  Commonly known as:  TENORMIN  Take 1 tablet (25 mg total) by mouth daily.     BIOTIN PO  Take 2 tablets by mouth daily.     CHANTIX 1 MG tablet  Generic drug:  varenicline  Take 1 mg by mouth 2 (two) times daily.     COQ10 PO  Take 1 tablet by mouth daily.     cyanocobalamin 1000 MCG/ML injection  Commonly known as:   (VITAMIN B-12)  Inject 1,000 mcg into the muscle every 30 (thirty) days. As needed when feeling tired     cyclobenzaprine 5 MG tablet  Commonly known as:  FLEXERIL  Take 5 mg by mouth 3 (three) times daily as needed for muscle spasms.     DSS 100 MG Caps  Take 100 mg by mouth 2 (two) times daily.     ezetimibe 10 MG tablet  Commonly known as:  ZETIA  Take 1 tablet (10 mg total) by mouth daily.     FISH OIL PO  Take 1 capsule by mouth daily.     GLUCOSAMINE-CHONDROITIN PO  Take 1 tablet by mouth daily.     HYDROcodone-acetaminophen 5-325 MG tablet  Commonly known as:  NORCO  Take 1-2 tablets by mouth every 4 (four) hours as needed for moderate pain or severe pain.     IRON PO  Take 1 tablet by mouth at bedtime as needed. When feeling tired     levothyroxine 88 MCG tablet  Commonly known as:  SYNTHROID, LEVOTHROID  Take 88 mcg by mouth daily before breakfast.     lisinopril 5 MG tablet  Commonly known as:  PRINIVIL,ZESTRIL  Take 1 tablet (5 mg total) by mouth daily.     methocarbamol 500 MG tablet  Commonly known as:  ROBAXIN  Take 1 tablet (500 mg total) by mouth every 6 (six) hours as needed for muscle spasms.     mometasone 50 MCG/ACT nasal spray  Commonly known as:  NASONEX  Place 2 sprays into the nose daily as needed. For allergies     nitroGLYCERIN 0.4 MG SL tablet  Commonly known as:  NITROSTAT  Place 0.4 mg under the tongue every 5 (five) minutes as needed for chest pain.     penicillin v potassium 500 MG tablet  Commonly known as:  VEETID  Per pt to take before dental appt and after     promethazine 25 MG tablet  Commonly known as:  PHENERGAN  Take 1 tablet (25 mg total) by mouth every 6 (six) hours as needed for nausea.     rosuvastatin 20 MG tablet  Commonly known as:  CRESTOR  Take 1 tablet (20 mg total) by mouth daily.     triamcinolone 0.1 % paste  Commonly known as:  KENALOG  1 application.  valACYclovir 500 MG tablet  Commonly known  as:  VALTREX  Take 500 mg by mouth daily as needed (Cold sore).     VITAMIN D PO  Take 1 tablet by mouth daily.     zolpidem 10 MG tablet  Commonly known as:  AMBIEN  Take 10 mg by mouth at bedtime as needed for sleep.        Diagnostic Studies: Dg Chest 2 View  08/10/2015  CLINICAL DATA:  Preoperative evaluation. History of hypertension. History of breast carcinoma EXAM: CHEST  2 VIEW COMPARISON:  July 09, 2012 FINDINGS: There is no edema or consolidation. The heart size and pulmonary vascularity are normal. No adenopathy. No bone lesions. There is postoperative change in the right breast region. IMPRESSION: No edema or consolidation. Electronically Signed   By: Lowella Grip III M.D.   On: 08/10/2015 15:52   Dg Hip Operative Unilat With Pelvis Left  08/21/2015  CLINICAL DATA:  Left hip replacement. EXAM: OPERATIVE LEFT HIP (WITH PELVIS IF PERFORMED) 2 VIEWS TECHNIQUE: Fluoroscopic spot image(s) were submitted for interpretation post-operatively. COMPARISON:  None. FINDINGS: Two intraoperative spot images demonstrate changes of left hip replacement. Normal AP alignment. Presumed remote changes of right hip replacement. No complicating feature. IMPRESSION: Left hip replacement without visible complicating feature. Electronically Signed   By: Rolm Baptise M.D.   On: 08/21/2015 10:14    Disposition: 01-Home or Self Care      Discharge Instructions    Call MD / Call 911    Complete by:  As directed   If you experience chest pain or shortness of breath, CALL 911 and be transported to the hospital emergency room.  If you develope a fever above 101 F, pus (white drainage) or increased drainage or redness at the wound, or calf pain, call your surgeon's office.     Constipation Prevention    Complete by:  As directed   Drink plenty of fluids.  Prune juice may be helpful.  You may use a stool softener, such as Colace (over the counter) 100 mg twice a day.  Use MiraLax (over the counter)  for constipation as needed.     Diet - low sodium heart healthy    Complete by:  As directed      Discharge instructions    Complete by:  As directed   INSTRUCTIONS AFTER JOINT REPLACEMENT   Remove items at home which could result in a fall. This includes throw rugs or furniture in walking pathways ICE to the affected joint every three hours while awake for 30 minutes at a time, for at least the first 3-5 days, and then as needed for pain and swelling.  Continue to use ice for pain and swelling. You may notice swelling that will progress down to the foot and ankle.  This is normal after surgery.  Elevate your leg when you are not up walking on it.   Continue to use the breathing machine you got in the hospital (incentive spirometer) which will help keep your temperature down.  It is common for your temperature to cycle up and down following surgery, especially at night when you are not up moving around and exerting yourself.  The breathing machine keeps your lungs expanded and your temperature down.   DIET:  As you were doing prior to hospitalization, we recommend a well-balanced diet.  DRESSING / WOUND CARE / SHOWERING  You may shower 3 days after surgery, but keep the wounds dry during showering.  You may use an occlusive plastic wrap (Press'n Seal for example), NO SOAKING/SUBMERGING IN THE BATHTUB.  If the bandage gets wet, change with a clean dry gauze.  If the incision gets wet, pat the wound dry with a clean towel.  ACTIVITY  Increase activity slowly as tolerated, but follow the weight bearing instructions below.   No driving for 6 weeks or until further direction given by your physician.  You cannot drive while taking narcotics.  No lifting or carrying greater than 10 lbs. until further directed by your surgeon. Avoid periods of inactivity such as sitting longer than an hour when not asleep. This helps prevent blood clots.  You may return to work once you are authorized by your doctor.      WEIGHT BEARING   Weight bearing as tolerated with assist device (walker, cane, etc) as directed, use it as long as suggested by your surgeon or therapist, typically at least 4-6 weeks.   EXERCISES  Results after joint replacement surgery are often greatly improved when you follow the exercise, range of motion and muscle strengthening exercises prescribed by your doctor. Safety measures are also important to protect the joint from further injury. Any time any of these exercises cause you to have increased pain or swelling, decrease what you are doing until you are comfortable again and then slowly increase them. If you have problems or questions, call your caregiver or physical therapist for advice.   Rehabilitation is important following a joint replacement. After just a few days of immobilization, the muscles of the leg can become weakened and shrink (atrophy).  These exercises are designed to build up the tone and strength of the thigh and leg muscles and to improve motion. Often times heat used for twenty to thirty minutes before working out will loosen up your tissues and help with improving the range of motion but do not use heat for the first two weeks following surgery (sometimes heat can increase post-operative swelling).   These exercises can be done on a training (exercise) mat, on the floor, on a table or on a bed. Use whatever works the best and is most comfortable for you.    Use music or television while you are exercising so that the exercises are a pleasant break in your day. This will make your life better with the exercises acting as a break in your routine that you can look forward to.   Perform all exercises about fifteen times, three times per day or as directed.  You should exercise both the operative leg and the other leg as well.   Exercises include:   Quad Sets - Tighten up the muscle on the front of the thigh (Quad) and hold for 5-10 seconds.   Straight Leg Raises -  With your knee straight (if you were given a brace, keep it on), lift the leg to 60 degrees, hold for 3 seconds, and slowly lower the leg.  Perform this exercise against resistance later as your leg gets stronger.  Leg Slides: Lying on your back, slowly slide your foot toward your buttocks, bending your knee up off the floor (only go as far as is comfortable). Then slowly slide your foot back down until your leg is flat on the floor again.  Angel Wings: Lying on your back spread your legs to the side as far apart as you can without causing discomfort.  Hamstring Strength:  Lying on your back, push your heel against the floor with your leg  straight by tightening up the muscles of your buttocks.  Repeat, but this time bend your knee to a comfortable angle, and push your heel against the floor.  You may put a pillow under the heel to make it more comfortable if necessary.   A rehabilitation program following joint replacement surgery can speed recovery and prevent re-injury in the future due to weakened muscles. Contact your doctor or a physical therapist for more information on knee rehabilitation.    CONSTIPATION  Constipation is defined medically as fewer than three stools per week and severe constipation as less than one stool per week.  Even if you have a regular bowel pattern at home, your normal regimen is likely to be disrupted due to multiple reasons following surgery.  Combination of anesthesia, postoperative narcotics, change in appetite and fluid intake all can affect your bowels.   YOU MUST use at least one of the following options; they are listed in order of increasing strength to get the job done.  They are all available over the counter, and you may need to use some, POSSIBLY even all of these options:    Drink plenty of fluids (prune juice may be helpful) and high fiber foods Colace 100 mg by mouth twice a day  Senokot for constipation as directed and as needed Dulcolax (bisacodyl),  take with full glass of water  Miralax (polyethylene glycol) once or twice a day as needed.  If you have tried all these things and are unable to have a bowel movement in the first 3-4 days after surgery call either your surgeon or your primary doctor.    If you experience loose stools or diarrhea, hold the medications until you stool forms back up.  If your symptoms do not get better within 1 week or if they get worse, check with your doctor.  If you experience "the worst abdominal pain ever" or develop nausea or vomiting, please contact the office immediately for further recommendations for treatment.   ITCHING:  If you experience itching with your medications, try taking only a single pain pill, or even half a pain pill at a time.  You can also use Benadryl over the counter for itching or also to help with sleep.   TED HOSE STOCKINGS:  Use stockings on both legs until for at least 2 weeks or as directed by physician office. They may be removed at night for sleeping.  MEDICATIONS:  See your medication summary on the "After Visit Summary" that nursing will review with you.  You may have some home medications which will be placed on hold until you complete the course of blood thinner medication.  It is important for you to complete the blood thinner medication as prescribed.  PRECAUTIONS:  If you experience chest pain or shortness of breath - call 911 immediately for transfer to the hospital emergency department.   If you develop a fever greater that 101 F, purulent drainage from wound, increased redness or drainage from wound, foul odor from the wound/dressing, or calf pain - CONTACT YOUR SURGEON.                                                   FOLLOW-UP APPOINTMENTS:  If you do not already have a post-op appointment, please call the office for an appointment to be seen by your surgeon.  Guidelines for how soon to be seen are listed in your "After Visit Summary", but are typically between 1-4  weeks after surgery.  OTHER INSTRUCTIONS:   Knee Replacement:  Do not place pillow under knee, focus on keeping the knee straight while resting. CPM instructions: 0-90 degrees, 2 hours in the morning, 2 hours in the afternoon, and 2 hours in the evening. Place foam block, curve side up under heel at all times except when in CPM or when walking.  DO NOT modify, tear, cut, or change the foam block in any way.  MAKE SURE YOU:  Understand these instructions.  Get help right away if you are not doing well or get worse.    Thank you for letting us be a part of your medical care team.  It is a privilege we respect greatly.  We hope these instructions will help you stay on track for a fast and full recovery!     Increase activity slowly as tolerated    Complete by:  As directed            Follow-up Information    Follow up with Hessie Dibble, MD. Schedule an appointment as soon as possible for a visit in 2 weeks.   Specialty:  Orthopedic Surgery   Contact information:   [place] [phone]                     Signed: Rich Fuchs 08/24/2015, 7:26 AM

## 2015-08-24 NOTE — Care Management Note (Signed)
Case Management Note  Patient Details  Name: Amber [name] MRN: [id number] Date of Birth: 05/05/1961  Subjective/Objective:           S/p left total hip arthroplasty         Action/Plan: Spoke with patient about discharge plan, she selected [facility]. Contacted Tiffany at [facility] and set up [place], [place] and aide visits. Contacted James at [facility] and requested rolling walker and 3N1 be delivered to patient's room. Patient states that her sister and mother live near by and will be checking on her and that she feels that she will be able to care for herself since she has had hip surgery before.   Expected Discharge Date:                  Expected Discharge Plan:  [place]  In-House Referral:  Clinical Social Work  Discharge planning Services  CM Consult  Post Acute Care Choice:  Durable Medical Equipment, Home Health Choice offered to:  Patient  DME Arranged:  3-N-1, Walker rolling DME Agency:  [place]:  PT, OT, Nurse's Aide [place] Agency:  [place]  Status of Service:  Completed, signed off  Medicare Important Message Given:    Date Medicare IM Given:    Medicare IM give by:    Date Additional Medicare IM Given:    Additional Medicare Important Message give by:     If discussed at [place] of Stay Meetings, dates discussed:    Additional Comments:  Nila Nephew, RN 08/24/2015, 8:38 AM

## 2015-08-28 ENCOUNTER — Other Ambulatory Visit: Payer: Self-pay | Admitting: Oncology

## 2015-08-28 DIAGNOSIS — C50111 Malignant neoplasm of central portion of right female breast: Secondary | ICD-10-CM

## 2015-09-24 ENCOUNTER — Telehealth: Payer: Self-pay | Admitting: Interventional Cardiology

## 2015-09-24 NOTE — Telephone Encounter (Signed)
Yesterday she had chest pain off and on all day. Described as electrical pulse type pain right chest. Pain in one spot. Plus she states she just didn't feel good. Denies sob, dyspnea, diaphoresis, pain transferal. Pain is not like her cp when she had a MI. States she took nitro x 3 that did not help but noted the nitro was expired.  I reviewed symptoms with Truitt Merle NP, she advised to follow up with pcp first. Pt was advised and will call pcp.

## 2015-09-24 NOTE — Telephone Encounter (Signed)
New message  Pt c/o of Chest Pain: 1. Are you having CP right now? No 2. Are you experiencing any other symptoms (ex. SOB, nausea, vomiting, sweating)? No ( Just didn't feel right or good)  3. How long have you been experiencing CP? Since yesterday  4. Is your CP continuous or coming and going? Came and went  5. Have you taken Nitroglycerin? Yes! ( She took a nitro that was out of date)   Comments: Pt called states that she would like a same day appt. (going out of town) Made appt for Wednesday 09/26/2015. Please call back to discuss.

## 2015-09-26 ENCOUNTER — Ambulatory Visit: Payer: BC Managed Care – PPO | Admitting: Cardiology

## 2015-10-26 ENCOUNTER — Other Ambulatory Visit: Payer: Self-pay | Admitting: Interventional Cardiology

## 2015-10-29 ENCOUNTER — Ambulatory Visit: Payer: Self-pay | Admitting: Podiatry

## 2015-11-05 ENCOUNTER — Ambulatory Visit: Payer: Self-pay | Admitting: Podiatry

## 2015-11-09 ENCOUNTER — Ambulatory Visit (INDEPENDENT_AMBULATORY_CARE_PROVIDER_SITE_OTHER): Payer: BC Managed Care – PPO

## 2015-11-09 ENCOUNTER — Encounter: Payer: Self-pay | Admitting: Podiatry

## 2015-11-09 ENCOUNTER — Ambulatory Visit (INDEPENDENT_AMBULATORY_CARE_PROVIDER_SITE_OTHER): Payer: BC Managed Care – PPO | Admitting: Podiatry

## 2015-11-09 VITALS — BP 138/84 | HR 64 | Resp 16 | Ht 65.0 in | Wt 140.0 lb

## 2015-11-09 DIAGNOSIS — M79672 Pain in left foot: Secondary | ICD-10-CM

## 2015-11-09 DIAGNOSIS — M79671 Pain in right foot: Secondary | ICD-10-CM

## 2015-11-09 DIAGNOSIS — M779 Enthesopathy, unspecified: Secondary | ICD-10-CM

## 2015-11-09 DIAGNOSIS — Q828 Other specified congenital malformations of skin: Secondary | ICD-10-CM

## 2015-11-09 MED ORDER — TRIAMCINOLONE ACETONIDE 10 MG/ML IJ SUSP
10.0000 mg | Freq: Once | INTRAMUSCULAR | Status: AC
  Administered 2015-11-09: 10 mg

## 2015-11-09 NOTE — Progress Notes (Signed)
   Subjective:    Patient ID: Amber [name], female    DOB: [birth year], 55 y.o.   MRN: [id number]  HPI Patient presents with bilateral foot pain. On Left foot; arch & lateral side. On the Right foot-bump on heel-medial side; pt stated, "foot feels sore"; x1 week.   Review of Systems  All other systems reviewed and are negative.      Objective:   Physical Exam        Assessment & Plan:

## 2015-11-09 NOTE — Progress Notes (Signed)
Subjective:     Patient ID: Amber [name], female   DOB: [birth year], 55 y.o.   MRN: [id number]  HPI patient states I twisted my left foot in December and I have had a lot of pain since. I did wear her boot for around 4 weeks which helped temporarily but then I had a hip replacement and cannot wear the boot anymore in mid December. I also have a lesion underneath my left metatarsal and on the medial side of my right heel   Review of Systems     Objective:   Physical Exam Neurovascular status found to be intact with muscle strength adequate range of motion within normal limits. Patient is noted to have quite a bit of discomfort in the peroneal tendon group as it comes under the lateral malleolus and when tested muscle strength appears to be normal. On the distal aspect of the left second metatarsal there is a keratotic lesion and there is a small nodule on the inside of the right heel that is localized in nature with no pain    Assessment:     Probable peroneal tendinitis left lateral ankle and cannot rule out any form of interstitial tear at this time. Porokeratotic lesion formation    Plan:     H&P and x-rays of left foot reviewed with patient. I did a careful sheath injection left 3 mg Kenalog 5 mill grams Xylocaine and discussed that if it does not improve we'll need to get an MRI and it's possible it we'll need to be repaired at one point in the future. Dispensed fascial brace with instructions to lift the lateral side up to take stress off the area and utilize ice therapy and anti-inflammatories and debrided lesion second left and we will watch lesion on the right and if it were to grow change color or become painful it may need biopsy or removal  Indicated no indications of fracture lateral foot left or diastases injury

## 2015-11-30 ENCOUNTER — Ambulatory Visit (INDEPENDENT_AMBULATORY_CARE_PROVIDER_SITE_OTHER): Payer: BC Managed Care – PPO | Admitting: Podiatry

## 2015-11-30 ENCOUNTER — Encounter: Payer: Self-pay | Admitting: Podiatry

## 2015-11-30 VITALS — BP 117/71 | HR 69 | Resp 12

## 2015-11-30 DIAGNOSIS — M779 Enthesopathy, unspecified: Secondary | ICD-10-CM | POA: Diagnosis not present

## 2015-11-30 NOTE — Progress Notes (Signed)
Subjective:     Patient ID: Amber [name], female   DOB: 1961/01/16, 55 y.o.   MRN: [id number]  HPI patient states my left foot is improved but still is sore if I do a lot of activity   Review of Systems     Objective:   Physical Exam Neurovascular status intact with patient found to have foot pain left lateral that still present but improved by about 50% with diminishment of swelling within the peroneal tendon group    Assessment:     Cannot rule out that there may not be peroneal tendinitis left but it is improved currently and I cannot rule out the possibility for a peroneal sheath tear    Plan:     Continue utilizing brace to take pressure off the peroneal group continue ice therapy and supportive shoe gear usage and if symptoms persist or get worse or any need to consider possible MRI and also modifying her existing orthotics which she would bring in with her

## 2015-12-27 ENCOUNTER — Other Ambulatory Visit: Payer: Self-pay | Admitting: Interventional Cardiology

## 2015-12-28 ENCOUNTER — Ambulatory Visit (INDEPENDENT_AMBULATORY_CARE_PROVIDER_SITE_OTHER): Payer: BC Managed Care – PPO | Admitting: Interventional Cardiology

## 2015-12-28 ENCOUNTER — Other Ambulatory Visit (INDEPENDENT_AMBULATORY_CARE_PROVIDER_SITE_OTHER): Payer: BC Managed Care – PPO

## 2015-12-28 ENCOUNTER — Encounter: Payer: Self-pay | Admitting: Interventional Cardiology

## 2015-12-28 VITALS — BP 110/70 | HR 55 | Ht 65.0 in | Wt 136.0 lb

## 2015-12-28 DIAGNOSIS — E782 Mixed hyperlipidemia: Secondary | ICD-10-CM | POA: Diagnosis not present

## 2015-12-28 DIAGNOSIS — I252 Old myocardial infarction: Secondary | ICD-10-CM | POA: Diagnosis not present

## 2015-12-28 DIAGNOSIS — I2510 Atherosclerotic heart disease of native coronary artery without angina pectoris: Secondary | ICD-10-CM

## 2015-12-28 DIAGNOSIS — Z720 Tobacco use: Secondary | ICD-10-CM | POA: Diagnosis not present

## 2015-12-28 DIAGNOSIS — I10 Essential (primary) hypertension: Secondary | ICD-10-CM

## 2015-12-28 NOTE — Progress Notes (Signed)
Patient ID: Amber [name], female   DOB: March 17, 1961, 55 y.o.   MRN: [id number]     Cardiology Office Note   Date:  12/28/2015   ID:  Amber [name], DOB [birth year], MRN [id number]  PCP:  Reginia Naas, MD    No chief complaint on file. f/u CAD   Wt Readings from Last 3 Encounters:  12/28/15 136 lb (61.689 kg)  11/09/15 140 lb (63.504 kg)  08/21/15 147 lb (66.679 kg)       History of Present Illness: Amber [name] is a 55 y.o. female  Who had an anterior MI in 2001. She had a stent placed by Dr. Leonia Reeves.  She had her lipids checked in 3/17, TC was 145 with LDL 60.  Glucose was 100.  She had hip replacement in 12/16 so she has not been exercising.  No pain in the hip.   No heart issues at the time of the surgery.  No cardiac sx with exercise.    SHe had an MI in 2001. She received a stent at that time. At the time of her heart attack, she had nausea, diaphoresis and chest pain. SHe knew it was a heart attack. She has not had anything like that recently.   No NTG usage.     Past Medical History  Diagnosis Date  . Hyperlipidemia   . Osteoporosis   . Hypertension     Dr. [facility]  . GERD (gastroesophageal reflux disease)     hx of   . Neuromuscular disorder ([place])     carpal tunnel right hand  . Chronic neck pain     hx of  . Hypothyroidism   . Heart attack [facility]) 2001    Dr. Scarlette Calico  . Anginal pain ([place]) 2001  . Pernicious anemia   . Sickle cell trait ([place])   . Migraines     "last one was 1990's" (08/17/2012)  . DJD (degenerative joint disease) of hip     bil hip  . Breast cancer ([place])     invasive ductal carcinoma right breast; ER, PR + and HER 2 -  . History of radiation therapy 09/16/12- 10/19/12    right breast  . Tobacco abuse   . Allergic rhinitis   . Sleep apnea     does use a cpap  . Wears glasses     Past Surgical History  Procedure Laterality Date  . Nasal sinus surgery  1990  . Coronary angioplasty  with stent placement  2001    "1" (08/17/2012)  . Partial mastectomy with needle localization and axillary sentinel lymph node bx  07/15/2012    Procedure: PARTIAL MASTECTOMY WITH NEEDLE LOCALIZATION AND AXILLARY SENTINEL LYMPH NODE BX;  Surgeon: Adin Hector, MD;  Location: [place];  Service: General;  Laterality: Right;  . Total hip arthroplasty  08/17/2012    "right" (08/17/2012)  . Breast biopsy  06/2012    "right" (08/17/2012)  . Total hip arthroplasty  08/17/2012    Procedure: TOTAL HIP ARTHROPLASTY ANTERIOR APPROACH;  Surgeon: Hessie Dibble, MD;  Location: [place];  Service: Orthopedics;  Laterality: Right;  . Colonoscopy    . Dorsal compartment release Left 08/18/2014    Procedure: LEFT FIRST RELEASE DORSAL COMPARTMENT (DEQUERVAIN);  Surgeon: Hessie Dibble, MD;  Location: [place];  Service: Orthopedics;  Laterality: Left;  . Total hip arthroplasty Left 08/21/2015  . Total hip arthroplasty Left 08/21/2015    Procedure: TOTAL HIP  ARTHROPLASTY ANTERIOR APPROACH AND RIGHT TRIGGER THUMB RELEASE;  Surgeon: Melrose Nakayama, MD;  Location: [place];  Service: Orthopedics;  Laterality: Left;     Current Outpatient Prescriptions  Medication Sig Dispense Refill  . alendronate (FOSAMAX) 70 MG tablet TAKE ONE TABLET BY MOUTH ONCE A WEEK ON  SATURDAY 4 tablet 0  . amoxicillin (AMOXIL) 500 MG capsule Take 500 mg by mouth as directed. Takes for dental cleaning or any type of procedures may have    . anastrozole (ARIMIDEX) 1 MG tablet TAKE ONE TABLET BY MOUTH ONCE DAILY 90 tablet 1  . aspirin EC 325 MG EC tablet Take 1 tablet (325 mg total) by mouth 2 (two) times daily after a meal. 60 tablet 0  . atenolol (TENORMIN) 25 MG tablet Take 1 tablet (25 mg total) by mouth daily. 90 tablet 3  . BIOTIN PO Take 2 tablets by mouth daily.    . CHANTIX 1 MG tablet Take 1 mg by mouth 2 (two) times daily.     . Cholecalciferol (VITAMIN D PO) Take 1 tablet by mouth daily.    . Coenzyme Q10  (COQ10 PO) Take 1 tablet by mouth daily.    . cyanocobalamin (,VITAMIN B-12,) 1000 MCG/ML injection Inject 1,000 mcg into the muscle every 30 (thirty) days. As needed when feeling tired    . cyclobenzaprine (FLEXERIL) 5 MG tablet Take 5 mg by mouth 3 (three) times daily as needed for muscle spasms.    Marland Kitchen docusate sodium 100 MG CAPS Take 100 mg by mouth 2 (two) times daily. (Patient taking differently: Take 100 mg by mouth 2 (two) times daily as needed (STOOL SOFTNER). For stool softner) 30 capsule 1  . ezetimibe (ZETIA) 10 MG tablet TAKE ONE TABLET BY MOUTH ONCE DAILY 90 tablet 0  . GLUCOSAMINE-CHONDROITIN PO Take 1 tablet by mouth daily.    . IRON PO Take 1 tablet by mouth at bedtime as needed. When feeling tired    . levothyroxine (SYNTHROID, LEVOTHROID) 112 MCG tablet Take 112 mcg by mouth daily before breakfast.     . levothyroxine (SYNTHROID, LEVOTHROID) 88 MCG tablet Take 88 mcg by mouth daily before breakfast.     . lisinopril (PRINIVIL,ZESTRIL) 5 MG tablet Take 1 tablet (5 mg total) by mouth daily. 90 tablet 3  . methocarbamol (ROBAXIN) 500 MG tablet Take 1 tablet (500 mg total) by mouth every 6 (six) hours as needed for muscle spasms. 50 tablet 0  . mometasone (NASONEX) 50 MCG/ACT nasal spray Place 2 sprays into the nose daily as needed. For allergies    . nitroGLYCERIN (NITROSTAT) 0.4 MG SL tablet Place 0.4 mg under the tongue every 5 (five) minutes as needed for chest pain.    . Omega-3 Fatty Acids (FISH OIL PO) Take 1 capsule by mouth daily.    . penicillin v potassium (VEETID) 500 MG tablet Per pt to take before dental appt and after    . rosuvastatin (CRESTOR) 20 MG tablet Take 1 tablet (20 mg total) by mouth daily. (Patient taking differently: Take 20 mg by mouth every other day. ) 90 tablet 3  . triamcinolone (KENALOG) 0.1 % paste Use as directed 1 application in the mouth or throat as needed (FOR GUMS).     . valACYclovir (VALTREX) 500 MG tablet Take 500 mg by mouth daily as needed  (Cold sore).     Marland Kitchen zolpidem (AMBIEN) 10 MG tablet Take 10 mg by mouth at bedtime as needed for sleep.  No current facility-administered medications for this visit.    Allergies:   Tramadol; Benzoyl peroxide; and Betadine    Social History:  The patient  reports that she has been smoking Cigarettes.  She has a 17.5 pack-year smoking history. She has never used smokeless tobacco. She reports that she drinks alcohol. She reports that she does not use illicit drugs.   Family History:  The patient's family history includes Cancer in her cousin and maternal grandmother; Diabetes in her mother; Heart disease in her mother; Parkinson's disease in her maternal grandfather.    ROS:  Please see the history of present illness.   Otherwise, review of systems are positive for foot pain.   All other systems are reviewed and negative.    PHYSICAL EXAM: VS:  BP 110/70 mmHg  Pulse 55  Ht 5\' 5"  (1.651 m)  Wt 136 lb (61.689 kg)  BMI 22.63 kg/m2 , BMI Body mass index is 22.63 kg/(m^2). GEN: Well nourished, well developed, in no acute distress HEENT: normal Neck: no JVD, carotid bruits, or masses Cardiac: RRR; no murmurs, rubs, or gallops,no edema  Respiratory:  clear to auscultation bilaterally, normal work of breathing GI: soft, nontender, nondistended, + BS MS: no deformity or atrophy Skin: warm and dry, no rash Neuro:  Strength and sensation are intact Psych: euthymic mood, full affect   EKG:   The ekg ordered today demonstrates NSR, no ST segment changes   Recent Labs: 08/13/2015: ALT 20; BUN 8.1; Creatinine 0.9; HGB 13.6; Platelets 193; Potassium 4.1; Sodium 140   Lipid Panel    Component Value Date/Time   CHOL 108 11/02/2014 0806   TRIG 82.0 11/02/2014 0806   HDL 45.90 11/02/2014 0806   CHOLHDL 2 11/02/2014 0806   VLDL 16.4 11/02/2014 0806   LDLCALC 46 11/02/2014 0806     Other studies Reviewed: Additional studies/ records that were reviewed today with results  demonstrating: lipids as above.   ASSESSMENT AND PLAN:   1. CAD: Continue aspirin.  No angina. Continue regular exercise. S/p anterior MI with stent placement in 2001. No CHF. No bleeding issues. No cardiac issues during surgery in 12/15. Continue regular exercise. No signs of CHF.  2. Hyperlipidemia: We discussed her lipids in detail.  She is taking Crestor every other day and Zetia daily. Leg pain with daily Crestor.  OK not to take Crestor daily. 3. Smoking: I recommended that she stop smoking.  4. Hypertension: Controlled.  Continue current meds.  She is back to the gym.    Current medicines are reviewed at length with the patient today.  The patient concerns regarding her medicines were addressed.  The following changes have been made:  No change  Labs/ tests ordered today include:  Orders Placed This Encounter  Procedures  . EKG 12-Lead    Recommend 150 minutes/week of aerobic exercise Low fat, low carb, high fiber diet recommended  Disposition:   FU in 1 years   Teresita Madura., MD  12/28/2015 9:04 AM    [facility] [address], [place], [place]  [address] Phone: [phone]; Fax: [phone]

## 2015-12-28 NOTE — Addendum Note (Signed)
Addended by: Freada Bergeron on: 12/28/2015 03:17 PM   Modules accepted: Orders, Medications

## 2015-12-28 NOTE — Patient Instructions (Signed)

## 2016-01-07 ENCOUNTER — Encounter: Payer: Self-pay | Admitting: Interventional Cardiology

## 2016-02-01 ENCOUNTER — Other Ambulatory Visit: Payer: Self-pay | Admitting: Interventional Cardiology

## 2016-02-07 ENCOUNTER — Other Ambulatory Visit: Payer: Self-pay

## 2016-02-07 DIAGNOSIS — C50319 Malignant neoplasm of lower-inner quadrant of unspecified female breast: Secondary | ICD-10-CM

## 2016-02-07 DIAGNOSIS — M8580 Other specified disorders of bone density and structure, unspecified site: Secondary | ICD-10-CM

## 2016-02-10 ENCOUNTER — Other Ambulatory Visit: Payer: Self-pay | Admitting: Oncology

## 2016-02-11 ENCOUNTER — Ambulatory Visit (HOSPITAL_BASED_OUTPATIENT_CLINIC_OR_DEPARTMENT_OTHER): Payer: BC Managed Care – PPO | Admitting: Oncology

## 2016-02-11 ENCOUNTER — Other Ambulatory Visit: Payer: Self-pay | Admitting: Oncology

## 2016-02-11 ENCOUNTER — Encounter: Payer: Self-pay | Admitting: Oncology

## 2016-02-11 ENCOUNTER — Telehealth: Payer: Self-pay | Admitting: Oncology

## 2016-02-11 ENCOUNTER — Ambulatory Visit (HOSPITAL_BASED_OUTPATIENT_CLINIC_OR_DEPARTMENT_OTHER): Payer: BC Managed Care – PPO

## 2016-02-11 VITALS — BP 104/59 | HR 58 | Temp 98.6°F | Resp 18 | Ht 65.0 in | Wt 140.4 lb

## 2016-02-11 DIAGNOSIS — C50319 Malignant neoplasm of lower-inner quadrant of unspecified female breast: Secondary | ICD-10-CM

## 2016-02-11 DIAGNOSIS — M8580 Other specified disorders of bone density and structure, unspecified site: Secondary | ICD-10-CM

## 2016-02-11 DIAGNOSIS — Z170 Estrogen receptor positive status [ER+]: Secondary | ICD-10-CM | POA: Diagnosis not present

## 2016-02-11 DIAGNOSIS — D573 Sickle-cell trait: Secondary | ICD-10-CM

## 2016-02-11 DIAGNOSIS — E039 Hypothyroidism, unspecified: Secondary | ICD-10-CM

## 2016-02-11 DIAGNOSIS — C50111 Malignant neoplasm of central portion of right female breast: Secondary | ICD-10-CM

## 2016-02-11 DIAGNOSIS — Z79899 Other long term (current) drug therapy: Secondary | ICD-10-CM

## 2016-02-11 DIAGNOSIS — C50311 Malignant neoplasm of lower-inner quadrant of right female breast: Secondary | ICD-10-CM

## 2016-02-11 DIAGNOSIS — Z853 Personal history of malignant neoplasm of breast: Secondary | ICD-10-CM

## 2016-02-11 DIAGNOSIS — M859 Disorder of bone density and structure, unspecified: Secondary | ICD-10-CM

## 2016-02-11 DIAGNOSIS — Z1239 Encounter for other screening for malignant neoplasm of breast: Secondary | ICD-10-CM

## 2016-02-11 DIAGNOSIS — Z720 Tobacco use: Secondary | ICD-10-CM

## 2016-02-11 DIAGNOSIS — E2839 Other primary ovarian failure: Secondary | ICD-10-CM

## 2016-02-11 LAB — CBC WITH DIFFERENTIAL/PLATELET
BASO%: 1 % (ref 0.0–2.0)
BASOS ABS: 0.1 10*3/uL (ref 0.0–0.1)
EOS ABS: 0.3 10*3/uL (ref 0.0–0.5)
EOS%: 4.1 % (ref 0.0–7.0)
HEMATOCRIT: 37 % (ref 34.8–46.6)
HEMOGLOBIN: 11.8 g/dL (ref 11.6–15.9)
LYMPH#: 2 10*3/uL (ref 0.9–3.3)
LYMPH%: 26.2 % (ref 14.0–49.7)
MCH: 25.1 pg (ref 25.1–34.0)
MCHC: 31.9 g/dL (ref 31.5–36.0)
MCV: 78.8 fL — AB (ref 79.5–101.0)
MONO#: 0.7 10*3/uL (ref 0.1–0.9)
MONO%: 9.3 % (ref 0.0–14.0)
NEUT#: 4.5 10*3/uL (ref 1.5–6.5)
NEUT%: 59.4 % (ref 38.4–76.8)
Platelets: 249 10*3/uL (ref 145–400)
RBC: 4.69 10*6/uL (ref 3.70–5.45)
RDW: 20.9 % — AB (ref 11.2–14.5)
WBC: 7.6 10*3/uL (ref 3.9–10.3)

## 2016-02-11 LAB — COMPREHENSIVE METABOLIC PANEL
ALBUMIN: 4 g/dL (ref 3.5–5.0)
ALK PHOS: 70 U/L (ref 40–150)
ALT: 23 U/L (ref 0–55)
ANION GAP: 9 meq/L (ref 3–11)
AST: 24 U/L (ref 5–34)
BUN: 9.7 mg/dL (ref 7.0–26.0)
CALCIUM: 10 mg/dL (ref 8.4–10.4)
CO2: 23 mEq/L (ref 22–29)
Chloride: 110 mEq/L — ABNORMAL HIGH (ref 98–109)
Creatinine: 0.8 mg/dL (ref 0.6–1.1)
Glucose: 84 mg/dl (ref 70–140)
POTASSIUM: 3.9 meq/L (ref 3.5–5.1)
Sodium: 141 mEq/L (ref 136–145)
Total Bilirubin: <0.3 mg/dL (ref 0.20–1.20)
Total Protein: 7.4 g/dL (ref 6.4–8.3)

## 2016-02-11 MED ORDER — ANASTROZOLE 1 MG PO TABS: 1.0000 mg | ORAL_TABLET | Freq: Every day | ORAL | Status: DC

## 2016-02-11 NOTE — Progress Notes (Signed)
OFFICE PROGRESS NOTE   February 11, 2016   Physicians:K.Khan), [name] (PCP), H.Dalbert Batman, J.Varanasi, _Evelyn Varnado (gyn, [place]), S.Rogelio Seen, [facility] [place], Metta Clines  INTERVAL HISTORY:  Patient is seen, alone for visit, in 6 month follow up of stage 1 Invasive ductal carcinoma of right breast, on adjuvant Arimidex since 10-2012 which is planned for 5 years.  Most recent bilateral tomo mammograms were at [facility] 06-28-15 , with heterogeneously dense brreast tissue but no other mammographic findings of concern. Recommendation is for diagnostic mammograms 1 year/ should have tomo due to dense tissue.  Last DEXA at [facility] 11-2012 with osteopenia, still not repeated despite discussion at last visit. Patient tells me that the scanner was not functioning at time of mammograms last fall. She is on Fosamax and Vit D.  Patient continues to smoke 1 ppd, but has just begun Chantix by Dr [name]; we have discussed strategies for decreasing smoking during the initial phase of the chantix, and she is aware that she then needs to discontinue cigarettes as she remains on chantix. She does not think that the CXR in this EMR 08-10-15 was hers (?). She denies increased SOB, change in cough, sputum /hemoptysis or chest pain. Thyroid medication is being adjusted by Dr Tamala Julian, follow up there in 6 weeks. She had left total hip replacement by Dr Novella Olive 08-21-15, which has been helpful. She has continued discomfort left foot, had worn orthopedic boot for this x 4 weeks prior to the hip surgery. She has just prn follow up with orthopedics now. She has no other new or different bone pain.  She has felt tiny irregularity at left areola, unchanged in last several weeks since she noticed it, had probably not examined that area previously. . She is not aware of any changes in breasts otherwise She has had no recent infectious illness. She recently saw Dr Irish Lack in follow up  of hx MI and CAD with stent, yearly follow up there. She denies any bleeding. Appetite ok, no changes in bowels, no abdominal or pelvic discomfort.   Remainder of 10 point Review of Systems negative/ unchanged.   ONCOLOGIC HISTORY #1 06-2012 5 mm mass in the right breast at the 5:00 position. The biopsy showed invasive ductal carcinoma with associated DCIS tumor was ER +100% PR +60% Ki-67 6% and HER-2/neu negative. It was grade 1. #2 Central lumpectomy on 07/15/2012, pathology 0.6 cm ER positive PR positive HER-2/neu negative low grade invasive ductal carcinoma. Sentinel node was negative for metastatic disease.  #3 radiation therapy to the right breast. She tolerated this well. #4. adjuvant antiestrogen therapy consisting of Arimidex 1 mg daily begun ~ 11-04-2012. Total of 5 years of therapy is planned    Review of systems as above, also:  Remainder of 10 point Review of Systems negative.  Objective:  Vital signs in last 24 hours:  BP 104/59 mmHg  Pulse 58  Temp(Src) 98.6 F (37 C) (Oral)  Resp 18  Ht 5' 5" (1.651 m)  Wt 140 lb 6.4 oz (63.685 kg)  BMI 23.36 kg/m2  SpO2 100%  Alert, oriented and appropriate. Ambulatory without assistance difficulty.  Alopecia  HEENT:PERRL, sclerae not icteric. Oral mucosa moist without lesions, posterior pharynx clear.  Neck supple. No JVD.  Lymphatics:no cervical,supraclavicular, axillary or inguinal adenopathy Resp: somewhat diminished BS thruout otherwise clear to auscultation bilaterally and normal percussion bilaterally. Voice fairly low pitch, not breathy, unchanged.  Cardio: regular rate and rhythm. No gallop. GI: soft, nontender,  not distended, no mass or organomegaly. Normally active bowel sounds. Surgical incision not remarkable. Musculoskeletal/ Extremities: UE/ LE without pitting edema, cords, tenderness.  Neuro:  nonfocal  PSYCH appropriate mood and affect. Skin without rash, ecchymosis, petechiae Breasts: Right central  partial mastectomy no dominant mass, no skin findings of concern. Left breast without dominant mass, skin or nipple findings. Left areola without any abnormal findings on physical exam now. Axillae benign.   Lab Results:  Results for orders placed or performed in visit on 02/11/16  CBC with Differential  Result Value Ref Range   WBC 7.6 3.9 - 10.3 10e3/uL   NEUT# 4.5 1.5 - 6.5 10e3/uL   HGB 11.8 11.6 - 15.9 g/dL   HCT 37.0 34.8 - 46.6 %   Platelets 249 145 - 400 10e3/uL   MCV 78.8 (L) 79.5 - 101.0 fL   MCH 25.1 25.1 - 34.0 pg   MCHC 31.9 31.5 - 36.0 g/dL   RBC 4.69 3.70 - 5.45 10e6/uL   RDW 20.9 (H) 11.2 - 14.5 %   lymph# 2.0 0.9 - 3.3 10e3/uL   MONO# 0.7 0.1 - 0.9 10e3/uL   Eosinophils Absolute 0.3 0.0 - 0.5 10e3/uL   Basophils Absolute 0.1 0.0 - 0.1 10e3/uL   NEUT% 59.4 38.4 - 76.8 %   LYMPH% 26.2 14.0 - 49.7 %   MONO% 9.3 0.0 - 14.0 %   EOS% 4.1 0.0 - 7.0 %   BASO% 1.0 0.0 - 2.0 %  Comprehensive metabolic panel  Result Value Ref Range   Sodium 141 136 - 145 mEq/L   Potassium 3.9 3.5 - 5.1 mEq/L   Chloride 110 (H) 98 - 109 mEq/L   CO2 23 22 - 29 mEq/L   Glucose 84 70 - 140 mg/dl   BUN 9.7 7.0 - 26.0 mg/dL   Creatinine 0.8 0.6 - 1.1 mg/dL   Total Bilirubin <0.30 0.20 - 1.20 mg/dL   Alkaline Phosphatase 70 40 - 150 U/L   AST 24 5 - 34 U/L   ALT 23 0 - 55 U/L   Total Protein 7.4 6.4 - 8.3 g/dL   Albumin 4.0 3.5 - 5.0 g/dL   Calcium 10.0 8.4 - 10.4 mg/dL   Anion Gap 9 3 - 11 mEq/L   EGFR >90 >90 ml/min/1.73 m2    Hemoglobin still in normal range but slightly lower than prior to hip replacement, could still be related to that surgery.  Lipid panel done by Dr Tamala Julian 11-27-15 noted as scanned into this EMR  Studies/Results:  No results found.  Patient agrees with having  DEXA done prior to October mammograms. Orders in system for both exams.  Medications: I have reviewed the patient's current medications.  DISCUSSION I have explained again that aromatase inhibitor  can cause decrease in bone density, which was already in osteopenic range in 2014. As patient agrees to have bone density scan done, we will refill Arimidex for 3 months, need to be sure this is appropriate prior to next refill.   Discussed smoking cessation and encouraged her efforts.   Expect slightly lower hemoglobin related to hip surgery, but will need to have this followed up either by Dr Tamala Julian or at this office. Note sickle trait, MCV slightly lower.  Assessment/Plan:  1.Stage 1 (T1bN0) grade 1 ER PR + HER 2 negative invasive ductal carcinoma of central right breast: post central lumpectomy with sentinel node 06-2012, local radiation and on Arimidex since 11-04-2012.Marland Kitchen Up to date on mammograms due 06-2016; if she  has other concerns regarding left areola will let us know, but nothing that seems of concern on exam now. Continue Arimidex. Follow up medical oncology 6 months or sooner if concerns. Lipid panel by Dr Tamala Julian 3-28- 2017 scanned into this EMR. 2.osteopenia by bone density scan 11-30-2012. Medically necessary for yearly bone density scans while on high risk aromatase inhibitor, however this was not done due to scheduling issue in fall 2015, ,then machine not working day of her mammogram 06-2015 and she missed rescheduled apt. Ordered again now. Needs regular weight bearing exercise. 3.degenerative arthritis hips:post right hip replacement: left hip replacement by Dr Novella Olive 08-21-15. 4.post MI 2001, HTN, elevated lipids. Saw Dr Irish Lack 12-28-15 5.hx GERD, no complaints of this now 6.ongoing tobacco: has resumed smoking. Chantix begun in past week by Dr Tamala Julian.  Discussed need to DC entirely and cessation strategies.  7.lett wrist tenosynovitis improved with release surgery by Dr Rhona Raider 8.some discomfort still left foot since some injury there prior to the left hip replacement 9.hypothyroidism: meds being adjusted by Dr Tamala Julian. 10.sickle trait and use of B12 supplement 11.hemoglobin and  MCV slightly lower today: see above.   All questions answered and patient seems in agreement with recommendations above. Route Dr Tamala Julian. Time spent 25 min including >50% counseling and coordination of care.   LIVESAY,LENNIS P, MD   02/11/2016, 2:11 PM

## 2016-02-11 NOTE — Telephone Encounter (Signed)
appt made and avs printed °

## 2016-02-12 LAB — VITAMIN D 25 HYDROXY (VIT D DEFICIENCY, FRACTURES): Vitamin D, 25-Hydroxy: 46.8 ng/mL (ref 30.0–100.0)

## 2016-02-13 ENCOUNTER — Other Ambulatory Visit: Payer: Self-pay | Admitting: Oncology

## 2016-02-13 DIAGNOSIS — M8580 Other specified disorders of bone density and structure, unspecified site: Secondary | ICD-10-CM | POA: Insufficient documentation

## 2016-02-13 DIAGNOSIS — M859 Disorder of bone density and structure, unspecified: Secondary | ICD-10-CM | POA: Insufficient documentation

## 2016-02-13 DIAGNOSIS — C50111 Malignant neoplasm of central portion of right female breast: Secondary | ICD-10-CM

## 2016-02-13 DIAGNOSIS — E2839 Other primary ovarian failure: Secondary | ICD-10-CM | POA: Insufficient documentation

## 2016-02-13 DIAGNOSIS — Z720 Tobacco use: Secondary | ICD-10-CM | POA: Insufficient documentation

## 2016-02-15 ENCOUNTER — Telehealth: Payer: Self-pay

## 2016-02-15 NOTE — Telephone Encounter (Signed)
Told Amber [name] the results of the vitamin D level and Hemoglobin from 02-11-16 visit.

## 2016-02-15 NOTE — Telephone Encounter (Signed)
-----   Message from Gordy Levan, MD sent at 02/15/2016  7:25 AM EDT ----- Labs seen and need follow up: please let her know vit D level is in good range at 47. Would continue present Vit D supplement. Her hemoglobin was a little lower than she has had before, as we discussed - likely from hip surgery, but if she notices any bleeding, change in bowels, more fatigue before next visit,  she should get this rechecked here or by PCP

## 2016-02-29 ENCOUNTER — Ambulatory Visit
Admission: RE | Admit: 2016-02-29 | Discharge: 2016-02-29 | Disposition: A | Discharge status: Home / Self Care | Payer: BC Managed Care – PPO | Source: Ambulatory Visit | Attending: Oncology | Admitting: Oncology

## 2016-02-29 ENCOUNTER — Other Ambulatory Visit: Payer: Self-pay | Admitting: Interventional Cardiology

## 2016-02-29 ENCOUNTER — Telehealth: Payer: Self-pay

## 2016-02-29 DIAGNOSIS — M859 Disorder of bone density and structure, unspecified: Secondary | ICD-10-CM

## 2016-02-29 DIAGNOSIS — Z79899 Other long term (current) drug therapy: Secondary | ICD-10-CM

## 2016-02-29 DIAGNOSIS — M8580 Other specified disorders of bone density and structure, unspecified site: Secondary | ICD-10-CM

## 2016-02-29 DIAGNOSIS — E2839 Other primary ovarian failure: Secondary | ICD-10-CM

## 2016-02-29 NOTE — Telephone Encounter (Signed)
S/w pt per Dr Edwyna Shell attached message.

## 2016-02-29 NOTE — Telephone Encounter (Signed)
-----   Message from Gordy Levan, MD sent at 02/29/2016  2:01 PM EDT ----- Labs seen and need follow up: please let her know bone density is normal, which is a bit better than her last scan and fine to continue arimidex. Tell her I appreciate her getting the scan done

## 2016-05-29 ENCOUNTER — Other Ambulatory Visit: Payer: Self-pay | Admitting: Oncology

## 2016-05-29 DIAGNOSIS — C50111 Malignant neoplasm of central portion of right female breast: Secondary | ICD-10-CM

## 2016-06-30 ENCOUNTER — Ambulatory Visit
Admission: RE | Admit: 2016-06-30 | Discharge: 2016-06-30 | Disposition: A | Discharge status: Home / Self Care | Payer: BC Managed Care – PPO | Source: Ambulatory Visit | Attending: Oncology | Admitting: Oncology

## 2016-06-30 DIAGNOSIS — Z853 Personal history of malignant neoplasm of breast: Secondary | ICD-10-CM

## 2016-08-10 ENCOUNTER — Other Ambulatory Visit: Payer: Self-pay | Admitting: Oncology

## 2016-08-11 ENCOUNTER — Ambulatory Visit (HOSPITAL_BASED_OUTPATIENT_CLINIC_OR_DEPARTMENT_OTHER): Payer: BC Managed Care – PPO | Admitting: Oncology

## 2016-08-11 ENCOUNTER — Other Ambulatory Visit (HOSPITAL_BASED_OUTPATIENT_CLINIC_OR_DEPARTMENT_OTHER): Payer: BC Managed Care – PPO

## 2016-08-11 VITALS — BP 120/47 | HR 53 | Temp 98.2°F | Resp 18 | Ht 65.0 in | Wt 142.6 lb

## 2016-08-11 DIAGNOSIS — C50311 Malignant neoplasm of lower-inner quadrant of right female breast: Secondary | ICD-10-CM

## 2016-08-11 DIAGNOSIS — C50111 Malignant neoplasm of central portion of right female breast: Secondary | ICD-10-CM

## 2016-08-11 DIAGNOSIS — Z79811 Long term (current) use of aromatase inhibitors: Secondary | ICD-10-CM | POA: Diagnosis not present

## 2016-08-11 DIAGNOSIS — Z170 Estrogen receptor positive status [ER+]: Secondary | ICD-10-CM

## 2016-08-11 DIAGNOSIS — Z716 Tobacco abuse counseling: Secondary | ICD-10-CM

## 2016-08-11 DIAGNOSIS — E039 Hypothyroidism, unspecified: Secondary | ICD-10-CM

## 2016-08-11 DIAGNOSIS — Z720 Tobacco use: Secondary | ICD-10-CM

## 2016-08-11 DIAGNOSIS — M8580 Other specified disorders of bone density and structure, unspecified site: Secondary | ICD-10-CM | POA: Diagnosis not present

## 2016-08-11 DIAGNOSIS — Z79899 Other long term (current) drug therapy: Secondary | ICD-10-CM

## 2016-08-11 LAB — COMPREHENSIVE METABOLIC PANEL
ALT: 24 U/L (ref 0–55)
ANION GAP: 10 meq/L (ref 3–11)
AST: 28 U/L (ref 5–34)
Albumin: 3.7 g/dL (ref 3.5–5.0)
Alkaline Phosphatase: 72 U/L (ref 40–150)
BUN: 8.1 mg/dL (ref 7.0–26.0)
CHLORIDE: 108 meq/L (ref 98–109)
CO2: 25 meq/L (ref 22–29)
Calcium: 9.8 mg/dL (ref 8.4–10.4)
Creatinine: 0.8 mg/dL (ref 0.6–1.1)
GLUCOSE: 101 mg/dL (ref 70–140)
POTASSIUM: 3.9 meq/L (ref 3.5–5.1)
SODIUM: 143 meq/L (ref 136–145)
TOTAL PROTEIN: 7.3 g/dL (ref 6.4–8.3)
Total Bilirubin: 0.35 mg/dL (ref 0.20–1.20)

## 2016-08-11 LAB — CBC WITH DIFFERENTIAL/PLATELET
BASO%: 0.9 % (ref 0.0–2.0)
Basophils Absolute: 0.1 10*3/uL (ref 0.0–0.1)
EOS ABS: 0.3 10*3/uL (ref 0.0–0.5)
EOS%: 4.4 % (ref 0.0–7.0)
HCT: 41.1 % (ref 34.8–46.6)
HGB: 13.3 g/dL (ref 11.6–15.9)
LYMPH%: 25.1 % (ref 14.0–49.7)
MCH: 27.9 pg (ref 25.1–34.0)
MCHC: 32.3 g/dL (ref 31.5–36.0)
MCV: 86.2 fL (ref 79.5–101.0)
MONO#: 0.6 10*3/uL (ref 0.1–0.9)
MONO%: 7.5 % (ref 0.0–14.0)
NEUT%: 62.1 % (ref 38.4–76.8)
NEUTROS ABS: 4.7 10*3/uL (ref 1.5–6.5)
PLATELETS: 240 10*3/uL (ref 145–400)
RBC: 4.77 10*6/uL (ref 3.70–5.45)
RDW: 18.1 % — ABNORMAL HIGH (ref 11.2–14.5)
WBC: 7.6 10*3/uL (ref 3.9–10.3)
lymph#: 1.9 10*3/uL (ref 0.9–3.3)

## 2016-08-11 MED ORDER — ANASTROZOLE 1 MG PO TABS: 1.0000 mg | ORAL_TABLET | Freq: Every day | ORAL | 1 refills | Status: DC

## 2016-08-11 NOTE — Progress Notes (Signed)
OFFICE PROGRESS NOTE   August 13, 2016   Physicians: K.Khan), [name] (PCP), H.Dalbert Batman, J.Varanasi, _Evelyn Varnado (gyn, [place]), S.Rogelio Seen, GI [place], Metta Clines  INTERVAL HISTORY:   Patient is seen, alone for visit, in scheduled 6 month follow up of stage 1 invasive ductal carcinoma of right breast, on Arimidex since 10-2012 which is planned for 5 years.  She had bilateral tomo mammograms at [facility] 06-30-16, with heterogeneously dense breast tissue but otherwise no mammographic findings of concern. Most recent bone density scan was at [facility] 02-29-16, evaluating forearm and LS due to hardware in hips, improved in LS compared with 2014 and now in normal range.   Patient has felt generally well since she was here last, tho still needed adjustments in synthroid doising by PCP. She stopped smoking briefly with Chantix since last visit, but unfortunately has resumed at somewhat < 1 ppd. She tries to exercise at hotel gyms, as she travels regularly with work. She denies changes in either breast or any discomfort now on left. She denies increased SOB or change in cough, no chest pain. Occasional minimal arthralgias in hands that improve with activity, no other pain. Appetite is at baseline, energy adequate for present activities. No abdominal or pelvic complaints, bowels move regularly. No LE swelling, no bleeding. No recent fever or symptoms of infection Remainder of 10 point Review of Systems negative  Flu vaccine 06-2016  ONCOLOGIC HISTORY  #1 06-2012 5 mm mass in the right breast at the 5:00 position. The biopsy showed invasive ductal carcinoma with associated DCIS tumor was ER +100% PR +60% Ki-67 6% and HER-2/neu negative. It was grade 1. #2 Central lumpectomy on 07/15/2012, pathology 0.6 cm ER positive PR positive HER-2/neu negative low grade invasive ductal carcinoma. Sentinel node was negative for metastatic disease.  #3 radiation  therapy to the right breast. She tolerated this well. #4. adjuvant antiestrogen therapy consisting of Arimidex 1 mg daily begun ~ 11-04-2012. Total of 5 years of therapy is planned   Objective:  Vital signs in last 24 hours:  BP (!) 120/47 (BP Location: Left Arm, Patient Position: Sitting) Comment: informed Amber [name]  Pulse (!) 53   Temp 98.2 F (36.8 C) (Oral)   Resp 18   Ht 5' 5" (1.651 m)   Wt 142 lb 9.6 oz (64.7 kg)   SpO2 100%   BMI 23.73 kg/m  Weight up 2 lbs Alert, oriented and appropriate. Ambulatory without difficulty. Voice somewhat raspy as previously Normal hair pattern  HEENT:PERRL, sclerae not icteric. Oral mucosa moist without lesions, posterior pharynx clear.  Neck supple. No JVD.  Lymphatics:no cervical,supraclavicular, axillary or inguinal adenopathy Resp: minimal scattered crackles bilaterally, no wheezing,  No dullness to percussion bilaterally Cardio: regular rate and rhythm. No gallop. GI: soft, nontender, not distended, no mass or organomegaly. Normally active bowel sounds Musculoskeletal/ Extremities: without pitting edema, cords, tenderness Neuro:  nonfocal  PSYCH appropriate mood and affect Skin without rash, ecchymosis, petechiae Breasts: right with central partial mastectomy including nipple and areola, without dominant mass or skin findings. Left without dominant mass, skin or nipple findings. Axillae benign.   Lab Results:  Results for orders placed or performed in visit on 08/11/16  CBC with Differential  Result Value Ref Range   WBC 7.6 3.9 - 10.3 10e3/uL   NEUT# 4.7 1.5 - 6.5 10e3/uL   HGB 13.3 11.6 - 15.9 g/dL   HCT 41.1 34.8 - 46.6 %   Platelets 240 145 - 400 10e3/uL  MCV 86.2 79.5 - 101.0 fL   MCH 27.9 25.1 - 34.0 pg   MCHC 32.3 31.5 - 36.0 g/dL   RBC 4.77 3.70 - 5.45 10e6/uL   RDW 18.1 (H) 11.2 - 14.5 %   lymph# 1.9 0.9 - 3.3 10e3/uL   MONO# 0.6 0.1 - 0.9 10e3/uL   Eosinophils Absolute 0.3 0.0 - 0.5 10e3/uL   Basophils Absolute 0.1  0.0 - 0.1 10e3/uL   NEUT% 62.1 38.4 - 76.8 %   LYMPH% 25.1 14.0 - 49.7 %   MONO% 7.5 0.0 - 14.0 %   EOS% 4.4 0.0 - 7.0 %   BASO% 0.9 0.0 - 2.0 %  Comprehensive metabolic panel  Result Value Ref Range   Sodium 143 136 - 145 mEq/L   Potassium 3.9 3.5 - 5.1 mEq/L   Chloride 108 98 - 109 mEq/L   CO2 25 22 - 29 mEq/L   Glucose 101 70 - 140 mg/dl   BUN 8.1 7.0 - 26.0 mg/dL   Creatinine 0.8 0.6 - 1.1 mg/dL   Total Bilirubin 0.35 0.20 - 1.20 mg/dL   Alkaline Phosphatase 72 40 - 150 U/L   AST 28 5 - 34 U/L   ALT 24 0 - 55 U/L   Total Protein 7.3 6.4 - 8.3 g/dL   Albumin 3.7 3.5 - 5.0 g/dL   Calcium 9.8 8.4 - 10.4 mg/dL   Anion Gap 10 3 - 11 mEq/L   EGFR >90 >90 ml/min/1.73 m2     Studies/Results: EXAM:   06-30-16 2D DIGITAL DIAGNOSTIC BILATERAL MAMMOGRAM WITH CAD AND ADJUNCT TOMO  COMPARISON:  Previous exam(s).  ACR Breast Density Category c: The breast tissue is heterogeneously dense, which may obscure small masses.  FINDINGS: There are stable scarring changes located within the central right breast related to the patient's lumpectomy. There is no specific evidence for recurrent tumor or developing malignancy within either breast and the parenchymal pattern is stable  Mammographic images were processed with CAD.  IMPRESSION: Stable breast parenchymal pattern. No findings worrisome for recurrent tumor or developing malignancy.  RECOMMENDATION: Bilateral diagnostic mammography in 1 year.  I have discussed the findings and recommendations with the patient. Results were also provided in writing at the conclusion of the visit. If applicable, a reminder letter will be sent to the patient regarding the next appointment.  BI-RADS CATEGORY  2: Benign.    DUAL X-RAY ABSORPTIOMETRY (DXA) FOR BONE MINERAL DENSITY  02-29-16 IMPRESSION: Referring Physician:  Gordy Levan  PATIENT: Name: Amber [name], Amber [name] Patient ID: [id number] Birth Date: February 14, 1961 Height:  63.5 in. Sex: Female Measured: 02/29/2016 Weight: 140.0 lbs. Indications: Arimidex, Breast Cancer History, Estrogen Deficient, History of Fracture (Adult) (V15.51), History of Osteopenia, Hypothyroid, Postmenopausal, Synthroid, Tobacco User (Current Smoker) Fractures: Wrist Treatments: Calcium (E943.0), Vitamin D (E933.5)  ASSESSMENT: The BMD measured at Forearm Radius 33% is 0.813 g/cm2 with a T-score of -0.8. This patient is considered normal according to [facility]) criteria. There has been a statistically significant increase in BMD of Lumbar spine since prior exam dated 11/30/2012. Right and left femurs were excluded due to surgical hardware.  Site Region Measured Date Measured Age YA BMD Significant CHANGE T-score Left Forearm Radius 33% 02/29/2016 57.0 -0.8 0.813 g/cm2  AP Spine L1-L4 02/29/2016 57.0 -0.2 1.149 g/cm2 *    Medications: I have reviewed the patient's current medications. PCP has been adjusting synthroid dose over past several months; patient does not tell any differences with the changes. Continue Arimidex  DISCUSSION Plan 5 years of AI from 10-2012. Yearly mammograms, needs 3D with heterogeneously dense breast tissue.  Aromatase arthralgias discussed, not really bothersome now. She understands that these resolve within 1-2 weeks of stopping the medication. Encouraged her to continue good weight bearing exercise. She travels frequently with work, but tries to use gym facilities at hotels. With DEXA 01-2016 in normal range, ok to get next bone density scan in 2 years.   Discussed smoking cessation strategies. She used Chantix ~ 01-2016, stopped smoking for 2 weeks then resumed < 1 ppd with some stress event. Suggested identifying habit cigarettes, to allow cutting these out/ decreasing nicotine, then stopping with Chantix. She does want to stop smoking, aware of many related concerns. After visit MD aware that lung cancer screening program  still in process thru [facility]. WIll be in touch with patient and make referral to Eric Form if she is interested.   Patient is aware that another MD in breast medical oncology will assume care in 2018.   Assessment/Plan:  1.Stage 1 (T1bN0) grade 1 ER PR + HER 2 negative invasive ductal carcinoma of central right breast: post central lumpectomy with sentinel node 06-2012, local radiation and on Arimidex since 11-04-2012.Marland Kitchen Up to date on mammograms 06-30-2016. Continue Arimidex. Follow up medical oncology 6 months or sooner if concerns. Lipid panel by Dr Tamala Julian 3-28- 2017 scanned into this EMR. 2.osteopenia by bone density scan 11-30-2012, but now in normal range by scan at [facility] 02-29-16. Needs regular weight bearing exercise. Last Vit D level good. 3.degenerative arthritis hips:post right hip replacement: left hip replacement by Dr Novella Olive 08-21-15. 4.post MI 2001, HTN, elevated lipids. Saw Dr Irish Lack 12-28-15 5.hx GERD, no complaints of this now 6.ongoing tobacco: has resumed smoking. Discussed need to DC entirely and cessation strategies including Chantix. She is interested in stopping. Will offer referral for low dose chest CT screening for lung cancers thru [facility] study. 7.lett wrist tenosynovitis improved with release surgery by Dr Rhona Raider 8.no anemia now: likely lower just after hip surgery. 9.hypothyroidism: meds being adjusted by Dr Tamala Julian. 10.sickle trait and use of B12 supplement 11.flu vaccine 06-11-16    All questions answered and patient is in agreement with recommendations and plans above, will call if concerns prior to next scheduled visit. Time spent 25 min including >50% counseling and coordination of care. CC Dr Tamala Julian, message to collaborative RNs.   Evlyn Clines, MD   08/13/2016, 7:39 AM

## 2016-08-13 ENCOUNTER — Telehealth: Payer: Self-pay

## 2016-08-13 ENCOUNTER — Encounter: Payer: Self-pay | Admitting: Oncology

## 2016-08-13 NOTE — Telephone Encounter (Signed)
S/w pt per Dr Mariana Kaufman attached message. Pt is interested. Message sent to Eric Form RN.

## 2016-08-13 NOTE — Telephone Encounter (Signed)
-----   Message from Gordy Levan, MD sent at 08/13/2016  8:00 AM EST ----- Regarding: low dose CT chest screening study Please let patient know that there is ongoing study offering free screening limited chest CTs to smokers/ ex-smokers, thru [employer] group. If she would like information, RN please contact Eric Form RN with [facility] to make referral. I have reached Judson Roch easily by EMR messaging for other patients.  thanks

## 2016-08-14 ENCOUNTER — Other Ambulatory Visit: Payer: Self-pay

## 2016-08-14 DIAGNOSIS — Z720 Tobacco use: Secondary | ICD-10-CM

## 2016-09-06 IMAGING — RF DG HIP (WITH PELVIS) OPERATIVE*L*
1 series · 2 of 2 positions shown · non-contrast
Comparison: None.

CLINICAL DATA: Left hip replacement.

EXAM:
OPERATIVE LEFT HIP (WITH PELVIS IF PERFORMED) 2 VIEWS
TECHNIQUE: Fluoroscopic spot image(s) were submitted for interpretation
post-operatively.

[Series 1: run · 2 of 2 slices shown]
[im 1/2]
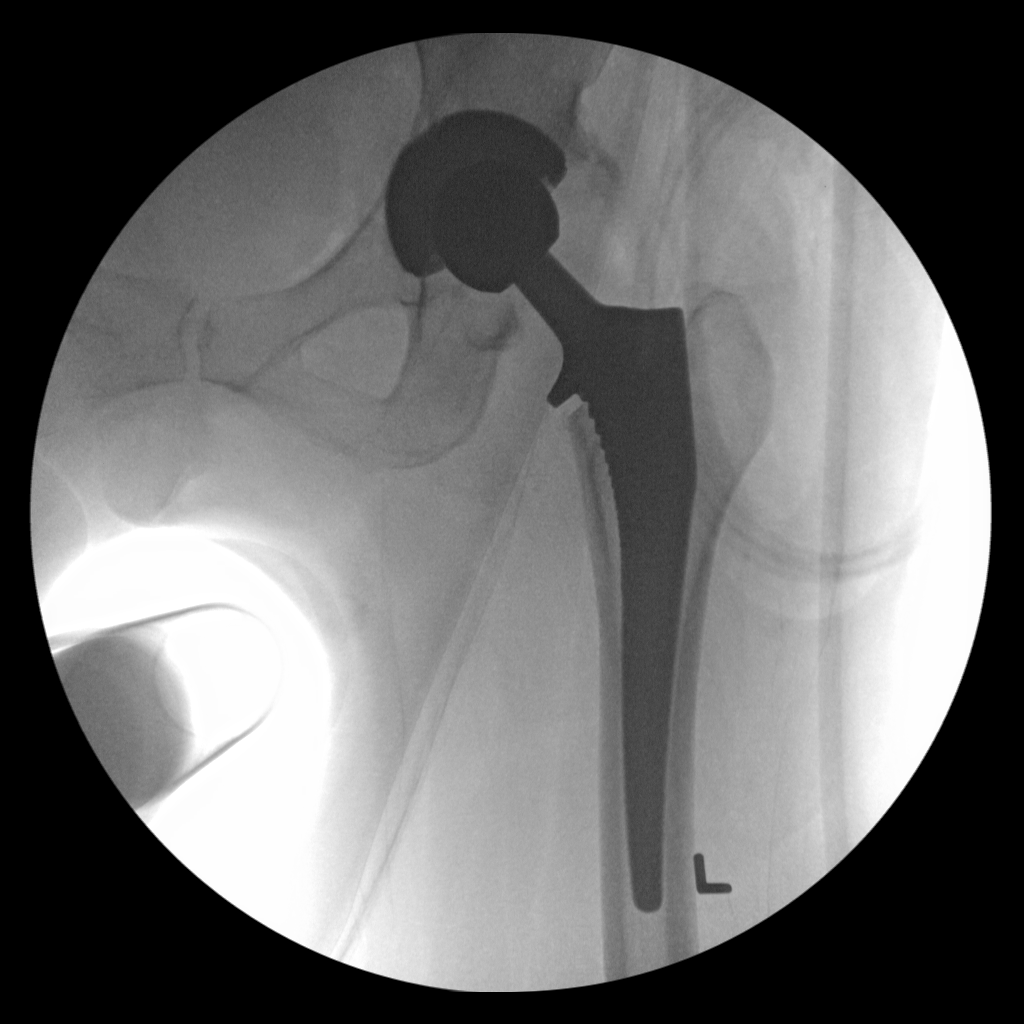
[im 2/2]
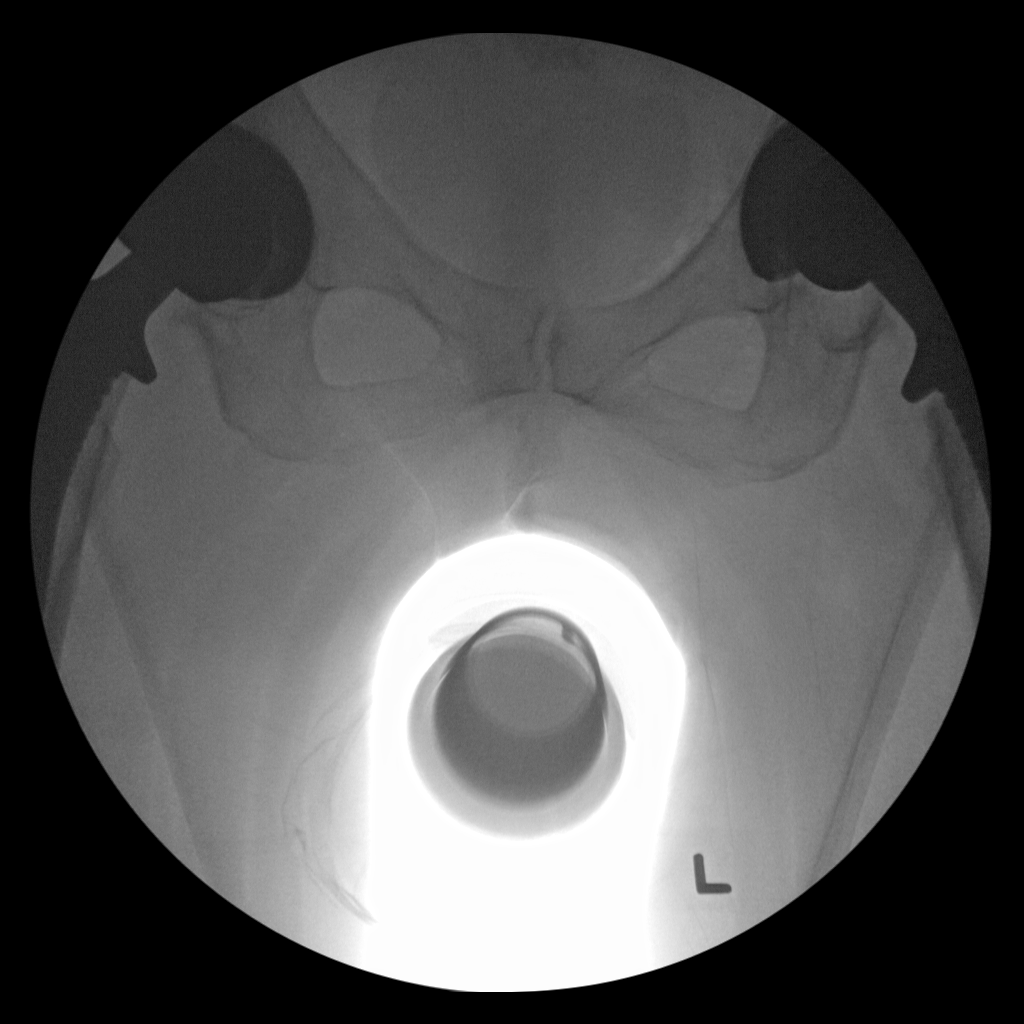

[2 of 2 positions shown; findings below may reference images not displayed]

FINDINGS: Two intraoperative spot images demonstrate changes of left hip
replacement. Normal AP alignment. Presumed remote changes of right
hip replacement. No complicating feature.
IMPRESSION: Left hip replacement without visible complicating feature.

## 2016-09-26 ENCOUNTER — Telehealth: Payer: Self-pay

## 2016-09-26 NOTE — Telephone Encounter (Signed)
Eric Form sent fax that pt does not qualify for lung cancer screening program due to smoking history less htan 30 pack years. The pt is aware of this.

## 2016-11-12 ENCOUNTER — Telehealth: Payer: Self-pay | Admitting: Interventional Cardiology

## 2016-11-12 DIAGNOSIS — I10 Essential (primary) hypertension: Secondary | ICD-10-CM

## 2016-11-12 DIAGNOSIS — E782 Mixed hyperlipidemia: Secondary | ICD-10-CM

## 2016-11-12 NOTE — Telephone Encounter (Signed)
Patient was calling to see if she can have labs drawn before her appointment on 12/29/16 so that her results can be discussed at her appointment. Please advise which labs to order. Thanks.

## 2016-11-12 NOTE — Telephone Encounter (Signed)
New Message  Pt voiced wanting to know about her labs. There's no order  Please f/u with pt

## 2016-11-12 NOTE — Telephone Encounter (Signed)
CMet and lipids prior to next appt.

## 2016-11-12 NOTE — Telephone Encounter (Signed)
CMET and fasting LIPIDS ordered. Lab appointment made on 12/19/16. Patient verbalizes understanding.

## 2016-12-07 ENCOUNTER — Other Ambulatory Visit: Payer: Self-pay | Admitting: Interventional Cardiology

## 2016-12-12 ENCOUNTER — Other Ambulatory Visit: Payer: Self-pay | Admitting: Interventional Cardiology

## 2016-12-12 NOTE — Telephone Encounter (Signed)
Medication Detail    Disp Refills Start End   ezetimibe (ZETIA) 10 MG tablet 30 tablet 0 12/08/2016    Sig: TAKE ONE TABLET BY MOUTH ONCE DAILY   Notes to Pharmacy: Needs office visit for further refills   E-Prescribing Status: Receipt confirmed by pharmacy (12/08/2016 12:59 PM EDT)   Pharmacy   [facility], [place]

## 2016-12-19 ENCOUNTER — Other Ambulatory Visit (INDEPENDENT_AMBULATORY_CARE_PROVIDER_SITE_OTHER): Payer: BC Managed Care – PPO

## 2016-12-19 DIAGNOSIS — I10 Essential (primary) hypertension: Secondary | ICD-10-CM | POA: Diagnosis not present

## 2016-12-19 DIAGNOSIS — E782 Mixed hyperlipidemia: Secondary | ICD-10-CM

## 2016-12-19 LAB — LIPID PANEL
CHOL/HDL RATIO: 2.6 ratio (ref 0.0–4.4)
Cholesterol, Total: 143 mg/dL (ref 100–199)
HDL: 54 mg/dL (ref >39)
LDL Calculated: 74 mg/dL (ref 0–99)
TRIGLYCERIDES: 77 mg/dL (ref 0–149)
VLDL Cholesterol Cal: 15 mg/dL (ref 5–40)

## 2016-12-19 LAB — COMPREHENSIVE METABOLIC PANEL
A/G RATIO: 1.7 (ref 1.2–2.2)
ALT: 19 IU/L (ref 0–32)
AST: 22 IU/L (ref 0–40)
Albumin: 4.5 g/dL (ref 3.5–5.5)
Alkaline Phosphatase: 70 IU/L (ref 39–117)
BUN / CREAT RATIO: 13 (ref 9–23)
BUN: 9 mg/dL (ref 6–24)
Bilirubin Total: 0.3 mg/dL (ref 0.0–1.2)
CALCIUM: 10.3 mg/dL — AB (ref 8.7–10.2)
CO2: 26 mmol/L (ref 18–29)
Chloride: 102 mmol/L (ref 96–106)
Creatinine, Ser: 0.71 mg/dL (ref 0.57–1.00)
GFR, EST AFRICAN AMERICAN: 109 mL/min/{1.73_m2} (ref >59)
GFR, EST NON AFRICAN AMERICAN: 95 mL/min/{1.73_m2} (ref >59)
GLOBULIN, TOTAL: 2.7 g/dL (ref 1.5–4.5)
Glucose: 104 mg/dL — ABNORMAL HIGH (ref 65–99)
POTASSIUM: 4.8 mmol/L (ref 3.5–5.2)
SODIUM: 142 mmol/L (ref 134–144)
Total Protein: 7.2 g/dL (ref 6.0–8.5)

## 2016-12-22 ENCOUNTER — Other Ambulatory Visit: Payer: BC Managed Care – PPO

## 2016-12-29 ENCOUNTER — Ambulatory Visit: Payer: BC Managed Care – PPO | Admitting: Interventional Cardiology

## 2017-01-01 NOTE — Progress Notes (Signed)
Patient ID: Amber [name], female   DOB: [birth year], 56 y.o.   MRN: [id number]     Cardiology Office Note   Date:  01/02/2017   ID:  Amber [name], DOB Jan 14, 1961, MRN [id number]  PCP:  Amber Naas, MD    No chief complaint on file. f/u CAD   Wt Readings from Last 3 Encounters:  01/02/17 144 lb (65.3 kg)  08/11/16 142 lb 9.6 oz (64.7 kg)  02/11/16 140 lb 6.4 oz (63.7 kg)       History of Present Illness: Amber [name] is a 56 y.o. female  Who had an anterior MI in 2001. She had a stent placed by Dr. Leonia Reeves.     She had hip replacement in 12/16.  No pain in the hip.   She does not have to pay any attention to it.   No heart issues at the time of the surgery.    No cardiac sx with exercise.  No SHOB.  She does treadmill, bike and some weights- several times a week.  At the time of her heart attack in 2001, she had nausea, diaphoresis and chest pain. SHe knew it was a heart attack. She has not had anything like that recently.   She has stopped smoking in Feb 2018.     She had one episode of chest pain while watching TV.  She took a NTG and it never came back.  It did not feel like her prior MI pain.  SHe took it to be safe.  She has not had problems with exercise is reassuring.    Past Medical History:  Diagnosis Date  . Allergic rhinitis   . Anginal pain ([place]) 2001  . Breast cancer ([place])    invasive ductal carcinoma right breast; ER, PR + and HER 2 -  . Chronic neck pain    hx of  . DJD (degenerative joint disease) of hip    bil hip  . GERD (gastroesophageal reflux disease)    hx of   . Heart attack [facility]) 2001   Dr. Scarlette Calico  . History of radiation therapy 09/16/12- 10/19/12   right breast  . Hyperlipidemia   . Hypertension    Dr. [facility]  . Hypothyroidism   . Migraines    "last one was 1990's" (08/17/2012)  . Neuromuscular disorder ([place])    carpal tunnel right hand  . Osteoporosis   . Pernicious anemia   .  Sickle cell trait ([place])   . Sleep apnea    does use a cpap  . Tobacco abuse   . Wears glasses     Past Surgical History:  Procedure Laterality Date  . BREAST BIOPSY  06/2012   "right" (08/17/2012)  . COLONOSCOPY    . CORONARY ANGIOPLASTY WITH STENT PLACEMENT  2001   "1" (08/17/2012)  . DORSAL COMPARTMENT RELEASE Left 08/18/2014   Procedure: LEFT FIRST RELEASE DORSAL COMPARTMENT ([place]);  Surgeon: Hessie Dibble, MD;  Location: [place];  Service: Orthopedics;  Laterality: Left;  . NASAL SINUS SURGERY  1990  . PARTIAL MASTECTOMY WITH NEEDLE LOCALIZATION AND AXILLARY SENTINEL LYMPH NODE BX  07/15/2012   Procedure: PARTIAL MASTECTOMY WITH NEEDLE LOCALIZATION AND AXILLARY SENTINEL LYMPH NODE BX;  Surgeon: Adin Hector, MD;  Location: [place];  Service: General;  Laterality: Right;  . TOTAL HIP ARTHROPLASTY  08/17/2012   "right" (08/17/2012)  . TOTAL HIP ARTHROPLASTY  08/17/2012   Procedure: TOTAL HIP ARTHROPLASTY ANTERIOR  APPROACH;  Surgeon: Hessie Dibble, MD;  Location: [place];  Service: Orthopedics;  Laterality: Right;  . TOTAL HIP ARTHROPLASTY Left 08/21/2015  . TOTAL HIP ARTHROPLASTY Left 08/21/2015   Procedure: TOTAL HIP ARTHROPLASTY ANTERIOR APPROACH AND RIGHT TRIGGER THUMB RELEASE;  Surgeon: Melrose Nakayama, MD;  Location: [place] [facility];  Service: Orthopedics;  Laterality: Left;     Current Outpatient Prescriptions  Medication Sig Dispense Refill  . alendronate (FOSAMAX) 70 MG tablet TAKE ONE TABLET BY MOUTH ONCE A WEEK ON  SATURDAY 4 tablet 0  . amoxicillin (AMOXIL) 500 MG capsule Take 500 mg by mouth as directed. Takes for dental cleaning or any type of procedures may have    . anastrozole (ARIMIDEX) 1 MG tablet Take 1 tablet (1 mg total) by mouth daily. 90 tablet 1  . aspirin EC 81 MG tablet Take 81 mg by mouth daily.    Marland Kitchen atenolol (TENORMIN) 25 MG tablet TAKE ONE TABLET BY MOUTH ONCE DAILY 90 tablet 3  . BIOTIN PO Take 2 tablets by mouth daily.    .  cholecalciferol (VITAMIN D) 1000 units tablet Take s 1000 units alternating with 2000 units every other day.    . Coenzyme Q10 (COQ10 PO) Take 1 tablet by mouth daily.    . CRESTOR 20 MG tablet TAKE ONE TABLET BY MOUTH ONCE DAILY 90 tablet 3  . cyanocobalamin (,VITAMIN B-12,) 1000 MCG/ML injection Inject 1,000 mcg into the muscle every 30 (thirty) days. Reported on 02/11/2016    . cyclobenzaprine (FLEXERIL) 5 MG tablet Take 5 mg by mouth 3 (three) times daily as needed for muscle spasms. Reported on 02/11/2016    . ezetimibe (ZETIA) 10 MG tablet TAKE ONE TABLET BY MOUTH ONCE DAILY 30 tablet 0  . GLUCOSAMINE-CHONDROITIN PO Take 1 tablet by mouth daily.    . IRON PO Take 1 tablet by mouth at bedtime as needed. When feeling tired    . levothyroxine (SYNTHROID, LEVOTHROID) 100 MCG tablet Take 100 mcg by mouth daily before breakfast.    . lisinopril (PRINIVIL,ZESTRIL) 5 MG tablet TAKE ONE TABLET BY MOUTH ONCE DAILY 90 tablet 3  . methocarbamol (ROBAXIN) 500 MG tablet Take 1 tablet (500 mg total) by mouth every 6 (six) hours as needed for muscle spasms. 50 tablet 0  . mometasone (NASONEX) 50 MCG/ACT nasal spray Place 2 sprays into the nose daily as needed. For allergies    . nitroGLYCERIN (NITROSTAT) 0.4 MG SL tablet Place 0.4 mg under the tongue every 5 (five) minutes as needed for chest pain. Reported on 02/11/2016    . Omega-3 Fatty Acids (FISH OIL PO) Take 1 capsule by mouth daily.    . penicillin v potassium (VEETID) 500 MG tablet Per pt to take before dental appt and after    . triamcinolone (KENALOG) 0.1 % paste Use as directed 1 application in the mouth or throat as needed (FOR GUMS).     . valACYclovir (VALTREX) 500 MG tablet Take 500 mg by mouth daily as needed (Cold sore). Reported on 02/11/2016    . zolpidem (AMBIEN) 10 MG tablet Take 10 mg by mouth at bedtime as needed for sleep. Reported on 02/11/2016     No current facility-administered medications for this visit.     Allergies:   Tramadol;  Benzoyl peroxide; and Betadine [povidone iodine]    Social History:  The patient  reports that she has been smoking Cigarettes.  She has a 35.00 pack-year smoking history. She has never used smokeless tobacco.  She reports that she drinks alcohol. She reports that she does not use drugs.   Family History:  The patient's family history includes Cancer in her cousin and maternal grandmother; Diabetes in her mother; Heart disease in her mother; Parkinson's disease in her maternal grandfather.    ROS:  Please see the history of present illness.   Otherwise, review of systems are positive for foot pain.   All other systems are reviewed and negative.    PHYSICAL EXAM: VS:  Pulse 62   Ht 5\' 5"  (1.651 m)   Wt 144 lb (65.3 kg)   SpO2 97%   BMI 23.96 kg/m  , BMI Body mass index is 23.96 kg/m. GEN: Well nourished, well developed, in no acute distress  HEENT: normal  Neck: no JVD, carotid bruits, or masses Cardiac: RRR; no murmurs, rubs, or gallops,no edema  Respiratory:  clear to auscultation bilaterally, normal work of breathing GI: soft, nontender, nondistended, + BS MS: no deformity or atrophy  Skin: warm and dry, no rash Neuro:  Strength and sensation are intact Psych: euthymic mood, full affect   EKG:   The ekg ordered today demonstrates NSR, no ST segment changes   Recent Labs: 08/11/2016: HGB 13.3; Platelets 240 12/19/2016: ALT 19; BUN 9; Creatinine, Ser 0.71; Potassium 4.8; Sodium 142   Lipid Panel    Component Value Date/Time   CHOL 143 12/19/2016 0815   TRIG 77 12/19/2016 0815   HDL 54 12/19/2016 0815   CHOLHDL 2.6 12/19/2016 0815   CHOLHDL 2 11/02/2014 0806   VLDL 16.4 11/02/2014 0806   LDLCALC 74 12/19/2016 0815     Other studies Reviewed: Additional studies/ records that were reviewed today with results demonstrating: lipids as above .   ASSESSMENT AND PLAN:   1. CAD: Continue aspirin.  No angina oncurrent medical therapy.  One episode of Chest pain sounded  atypical. No symptoms with exercise. She'll let us know if this pattern changes. Continue regular exercise. S/p anterior MI with stent placement in 2001. No CHF. No bleeding issues. No cardiac issues during surgery in 12/15. Continue regular exercise. No signs of CHF.  2. Hyperlipidemia: We discussed her lipids in detail. Controlled in 4/18.  She is taking Crestor every other day and Zetia daily. Leg pain with daily Crestor in the past.  OK not to take Crestor daily. 3. Smoking: I congratulated her for stopping smoking recently.   4. Hypertension: Controlled.  Continue current meds.  She is back to the gym.   We spoke about dietary changes as well to keep her borderline sugar decreased.   Current medicines are reviewed at length with the patient today.  The patient concerns regarding her medicines were addressed.  The following changes have been made:  No change  Labs/ tests ordered today include:  No orders of the defined types were placed in this encounter.   Recommend 150 minutes/week of aerobic exercise Low fat, low carb, high fiber diet recommended  Disposition:   FU in 1 years   Signed, Larae Grooms, MD  01/02/2017 10:40 AM    [facility] [address], [place], [place]  [address] Phone: [phone]; Fax: [phone]

## 2017-01-02 ENCOUNTER — Encounter: Payer: Self-pay | Admitting: Interventional Cardiology

## 2017-01-02 ENCOUNTER — Ambulatory Visit (INDEPENDENT_AMBULATORY_CARE_PROVIDER_SITE_OTHER): Payer: BC Managed Care – PPO | Admitting: Interventional Cardiology

## 2017-01-02 VITALS — BP 126/60 | HR 62 | Ht 65.0 in | Wt 144.0 lb

## 2017-01-02 DIAGNOSIS — I25118 Atherosclerotic heart disease of native coronary artery with other forms of angina pectoris: Secondary | ICD-10-CM

## 2017-01-02 DIAGNOSIS — Z720 Tobacco use: Secondary | ICD-10-CM | POA: Diagnosis not present

## 2017-01-02 DIAGNOSIS — I10 Essential (primary) hypertension: Secondary | ICD-10-CM

## 2017-01-02 DIAGNOSIS — I252 Old myocardial infarction: Secondary | ICD-10-CM | POA: Diagnosis not present

## 2017-01-02 NOTE — Patient Instructions (Signed)

## 2017-01-15 ENCOUNTER — Other Ambulatory Visit: Payer: Self-pay | Admitting: Internal Medicine

## 2017-01-15 ENCOUNTER — Telehealth: Payer: Self-pay | Admitting: Interventional Cardiology

## 2017-01-15 MED ORDER — EZETIMIBE 10 MG PO TABS: 10.0000 mg | ORAL_TABLET | Freq: Every day | ORAL | 3 refills | Status: DC

## 2017-01-15 NOTE — Telephone Encounter (Signed)
Patient made aware that 90 day supply of zetia 10 mg daily sent to [facility] in [place]. Patient verbalized understanding and appreciated the call.

## 2017-01-15 NOTE — Telephone Encounter (Signed)
New message    Pt is calling.    *STAT* If patient is at the pharmacy, call can be transferred to refill team.   1. Which medications need to be refilled? (please list name of each medication and dose if known) zetia 10 mg  2. Which pharmacy/location (including street and city if local pharmacy) is medication to be sent to? [facility] in [place]   3. Do they need a 30 day or 90 day supply? 90 day

## 2017-02-09 ENCOUNTER — Encounter: Payer: Self-pay | Admitting: Hematology and Oncology

## 2017-02-09 ENCOUNTER — Other Ambulatory Visit (HOSPITAL_BASED_OUTPATIENT_CLINIC_OR_DEPARTMENT_OTHER): Payer: BC Managed Care – PPO

## 2017-02-09 ENCOUNTER — Ambulatory Visit (HOSPITAL_BASED_OUTPATIENT_CLINIC_OR_DEPARTMENT_OTHER): Payer: BC Managed Care – PPO | Admitting: Hematology and Oncology

## 2017-02-09 DIAGNOSIS — Z79811 Long term (current) use of aromatase inhibitors: Secondary | ICD-10-CM

## 2017-02-09 DIAGNOSIS — C50111 Malignant neoplasm of central portion of right female breast: Secondary | ICD-10-CM

## 2017-02-09 DIAGNOSIS — C50311 Malignant neoplasm of lower-inner quadrant of right female breast: Secondary | ICD-10-CM

## 2017-02-09 DIAGNOSIS — Z170 Estrogen receptor positive status [ER+]: Secondary | ICD-10-CM

## 2017-02-09 LAB — CBC WITH DIFFERENTIAL/PLATELET
BASO%: 0.3 % (ref 0.0–2.0)
Basophils Absolute: 0 10*3/uL (ref 0.0–0.1)
EOS ABS: 0.3 10*3/uL (ref 0.0–0.5)
EOS%: 5.8 % (ref 0.0–7.0)
HCT: 38.6 % (ref 34.8–46.6)
HEMOGLOBIN: 12.8 g/dL (ref 11.6–15.9)
LYMPH%: 25.9 % (ref 14.0–49.7)
MCH: 29.2 pg (ref 25.1–34.0)
MCHC: 33.2 g/dL (ref 31.5–36.0)
MCV: 87.9 fL (ref 79.5–101.0)
MONO#: 0.6 10*3/uL (ref 0.1–0.9)
MONO%: 10.3 % (ref 0.0–14.0)
NEUT%: 57.7 % (ref 38.4–76.8)
NEUTROS ABS: 3.4 10*3/uL (ref 1.5–6.5)
Platelets: 218 10*3/uL (ref 145–400)
RBC: 4.39 10*6/uL (ref 3.70–5.45)
RDW: 15.5 % — AB (ref 11.2–14.5)
WBC: 5.8 10*3/uL (ref 3.9–10.3)
lymph#: 1.5 10*3/uL (ref 0.9–3.3)

## 2017-02-09 LAB — COMPREHENSIVE METABOLIC PANEL
ALBUMIN: 3.9 g/dL (ref 3.5–5.0)
ALK PHOS: 71 U/L (ref 40–150)
ALT: 19 U/L (ref 0–55)
AST: 26 U/L (ref 5–34)
Anion Gap: 8 mEq/L (ref 3–11)
BILIRUBIN TOTAL: 0.35 mg/dL (ref 0.20–1.20)
BUN: 9.6 mg/dL (ref 7.0–26.0)
CO2: 25 meq/L (ref 22–29)
Calcium: 10 mg/dL (ref 8.4–10.4)
Chloride: 107 mEq/L (ref 98–109)
Creatinine: 0.9 mg/dL (ref 0.6–1.1)
EGFR: 84 mL/min/{1.73_m2} — AB (ref >90)
GLUCOSE: 129 mg/dL (ref 70–140)
Potassium: 4.5 mEq/L (ref 3.5–5.1)
SODIUM: 140 meq/L (ref 136–145)
TOTAL PROTEIN: 7.3 g/dL (ref 6.4–8.3)

## 2017-02-09 MED ORDER — ANASTROZOLE 1 MG PO TABS: 1.0000 mg | ORAL_TABLET | Freq: Every day | ORAL | 1 refills | Status: DC

## 2017-02-09 NOTE — Progress Notes (Signed)
Patient Care Team: [name], MD as PCP - General (Family Medicine)  DIAGNOSIS:  Encounter Diagnoses  Name Primary?  . Malignant neoplasm of central portion of right breast in female, estrogen receptor positive ([place])   . Malignant neoplasm of central portion of right female breast, unspecified estrogen receptor status ([place])     SUMMARY OF ONCOLOGIC HISTORY:   Cancer of lower-inner quadrant of female breast ([place])   06/22/2012 Initial Diagnosis    5 mm mass in the right breast at the 5:00 position. The biopsy showed invasive ductal carcinoma with associated DCIS tumor was ER +100% PR +60% Ki-67 6% and HER-2/neu negative. It was grade 1.      07/15/2012 Surgery    Central lumpectomy on 07/15/2012, pathology 0.6 cm ER positive PR positive HER-2/neu negative low grade invasive ductal carcinoma. Sentinel node was negative for metastatic disease.      09/16/2012 - 10/15/2012 Radiation Therapy    Adjuvant radiation therapy      11/04/2012 -  Anti-estrogen oral therapy    Anastrozole 1 mg daily       CHIEF COMPLIANT: Transfer of care from Dr. Marko Plume to myself  INTERVAL HISTORY: Amber [name] is a 56 year old with above-mentioned history of right breast cancer who underwent lumpectomy followed by radiation and is currently on anastrozole therapy. She has been on it since March 2014 and will complete 5 years of therapy by March 2019. She has been tolerating anastrozole extremely well. She does not have any hot flashes or myalgias. She denies any lumps or nodules in the breasts. She has scar tissue in the right breast from the central lumpectomy which has not changed in its nodularity.  REVIEW OF SYSTEMS:   Constitutional: Denies fevers, chills or abnormal weight loss Eyes: Denies blurriness of vision Ears, nose, mouth, throat, and face: Denies mucositis or sore throat Respiratory: Denies cough, dyspnea or wheezes Cardiovascular: Denies palpitation, chest  discomfort Gastrointestinal:  Denies nausea, heartburn or change in bowel habits Skin: Denies abnormal skin rashes Lymphatics: Denies new lymphadenopathy or easy bruising Neurological:Denies numbness, tingling or new weaknesses Behavioral/Psych: Mood is stable, no new changes  Extremities: No lower extremity edema Breast:  Post surgical changes in the right breast All other systems were reviewed with the patient and are negative.  I have reviewed the past medical history, past surgical history, social history and family history with the patient and they are unchanged from previous note.  ALLERGIES:  is allergic to tramadol; benzoyl peroxide; and betadine [povidone iodine].  MEDICATIONS:  Current Outpatient Prescriptions  Medication Sig Dispense Refill  . alendronate (FOSAMAX) 70 MG tablet TAKE ONE TABLET BY MOUTH ONCE A WEEK ON  SATURDAY 4 tablet 0  . amoxicillin (AMOXIL) 500 MG capsule Take 500 mg by mouth as directed. Takes for dental cleaning or any type of procedures may have    . anastrozole (ARIMIDEX) 1 MG tablet Take 1 tablet (1 mg total) by mouth daily. 90 tablet 1  . aspirin EC 81 MG tablet Take 81 mg by mouth daily.    Marland Kitchen atenolol (TENORMIN) 25 MG tablet TAKE ONE TABLET BY MOUTH ONCE DAILY 90 tablet 3  . BIOTIN PO Take 2 tablets by mouth daily.    . cholecalciferol (VITAMIN D) 1000 units tablet Take s 1000 units alternating with 2000 units every other day.    . Coenzyme Q10 (COQ10 PO) Take 1 tablet by mouth daily.    . CRESTOR 20 MG tablet TAKE ONE TABLET BY MOUTH  ONCE DAILY 90 tablet 3  . cyanocobalamin (,VITAMIN B-12,) 1000 MCG/ML injection Inject 1,000 mcg into the muscle every 30 (thirty) days. Reported on 02/11/2016    . cyclobenzaprine (FLEXERIL) 5 MG tablet Take 5 mg by mouth 3 (three) times daily as needed for muscle spasms. Reported on 02/11/2016    . ezetimibe (ZETIA) 10 MG tablet Take 1 tablet (10 mg total) by mouth daily. 90 tablet 3  . GLUCOSAMINE-CHONDROITIN PO Take  1 tablet by mouth daily.    . IRON PO Take 1 tablet by mouth at bedtime as needed. When feeling tired    . levothyroxine (SYNTHROID, LEVOTHROID) 100 MCG tablet Take 100 mcg by mouth daily before breakfast.    . lisinopril (PRINIVIL,ZESTRIL) 5 MG tablet TAKE ONE TABLET BY MOUTH ONCE DAILY 90 tablet 3  . methocarbamol (ROBAXIN) 500 MG tablet Take 1 tablet (500 mg total) by mouth every 6 (six) hours as needed for muscle spasms. 50 tablet 0  . mometasone (NASONEX) 50 MCG/ACT nasal spray Place 2 sprays into the nose daily as needed. For allergies    . nitroGLYCERIN (NITROSTAT) 0.4 MG SL tablet Place 0.4 mg under the tongue every 5 (five) minutes as needed for chest pain. Reported on 02/11/2016    . Omega-3 Fatty Acids (FISH OIL PO) Take 1 capsule by mouth daily.    . penicillin v potassium (VEETID) 500 MG tablet Per pt to take before dental appt and after    . triamcinolone (KENALOG) 0.1 % paste Use as directed 1 application in the mouth or throat as needed (FOR GUMS).     . valACYclovir (VALTREX) 500 MG tablet Take 500 mg by mouth daily as needed (Cold sore). Reported on 02/11/2016    . zolpidem (AMBIEN) 10 MG tablet Take 10 mg by mouth at bedtime as needed for sleep. Reported on 02/11/2016     No current facility-administered medications for this visit.     PHYSICAL EXAMINATION: ECOG PERFORMANCE STATUS: 0 - Asymptomatic  Vitals:   02/09/17 0836  BP: (!) 120/45  Pulse: (!) 44  Resp: 18  Temp: 98.2 F (36.8 C)   Filed Weights   02/09/17 0836  Weight: 146 lb 1.6 oz (66.3 kg)    GENERAL:alert, no distress and comfortable SKIN: skin color, texture, turgor are normal, no rashes or significant lesions EYES: normal, Conjunctiva are pink and non-injected, sclera clear OROPHARYNX:no exudate, no erythema and lips, buccal mucosa, and tongue normal  NECK: supple, thyroid normal size, non-tender, without nodularity LYMPH:  no palpable lymphadenopathy in the cervical, axillary or inguinal LUNGS:  clear to auscultation and percussion with normal breathing effort HEART: regular rate & rhythm and no murmurs and no lower extremity edema ABDOMEN:abdomen soft, non-tender and normal bowel sounds MUSCULOSKELETAL:no cyanosis of digits and no clubbing  NEURO: alert & oriented x 3 with fluent speech, no focal motor/sensory deficits EXTREMITIES: No lower extremity edema BREAST: Scar tissue and postsurgical changes in the right breast and central lumpectomy. No palpable axillary supraclavicular or infraclavicular adenopathy no breast tenderness or nipple discharge. (exam performed in the presence of a chaperone)  LABORATORY DATA:  I have reviewed the data as listed   Chemistry      Component Value Date/Time   NA 142 12/19/2016 0815   NA 143 08/11/2016 0851   K 4.8 12/19/2016 0815   K 3.9 08/11/2016 0851   CL 102 12/19/2016 0815   CL 108 (H) 02/16/2013 0917   CO2 26 12/19/2016 0815   CO2 25  08/11/2016 0851   BUN 9 12/19/2016 0815   BUN 8.1 08/11/2016 0851   CREATININE 0.71 12/19/2016 0815   CREATININE 0.8 08/11/2016 0851      Component Value Date/Time   CALCIUM 10.3 (H) 12/19/2016 0815   CALCIUM 9.8 08/11/2016 0851   ALKPHOS 70 12/19/2016 0815   ALKPHOS 72 08/11/2016 0851   AST 22 12/19/2016 0815   AST 28 08/11/2016 0851   ALT 19 12/19/2016 0815   ALT 24 08/11/2016 0851   BILITOT 0.3 12/19/2016 0815   BILITOT 0.35 08/11/2016 0851       Lab Results  Component Value Date   WBC 5.8 02/09/2017   HGB 12.8 02/09/2017   HCT 38.6 02/09/2017   MCV 87.9 02/09/2017   PLT 218 02/09/2017   NEUTROABS 3.4 02/09/2017    ASSESSMENT & PLAN:  Malignant neoplasm of central portion of right breast in female, estrogen receptor positive ([place]) Treatment summary:  1. 06-2012 5 mm mass in the right breast at the 5:00 position. The biopsy showed invasive ductal carcinoma with associated DCIS tumor was ER +100% PR +60% Ki-67 6% and HER-2/neu negative. It was grade 1. 2. Central lumpectomy on  07/15/2012, pathology 0.6 cm ER positive PR positive HER-2/neu negative low grade invasive ductal carcinoma. Sentinel node was negative for metastatic disease.  3 radiation therapy to the right breast. She tolerated this well. 4. adjuvant antiestrogen therapy consisting of Arimidex 1 mg daily begun ~ 11-04-2012. Total of 5 years of therapy is planned  Arimidex toxicities: 1. Bone density 02/29/2016: T score -0.8 normal  Surveillance: 1. Mammograms: 06/30/2016: Benign 2. breast exam 02/09/2017: Benign   I spent a lot of time going over her prior records sent summarizing her findings and history. I reviewed her radiology reports as well as pathology reports in great detail. We discussed all of these reports and make sure that we are on the same page. Patient wishes to follow up with her primary care physician and her GYN. She will complete her anastrozole therapy with end of this year. I gave a new prescription today.  Return to clinic as needed. I spent 45 minutes talking to the patient of which more than half was spent in counseling and coordination of care.  No orders of the defined types were placed in this encounter.  The patient has a good understanding of the overall plan. she agrees with it. she will call with any problems that may develop before the next visit here.   Rulon Eisenmenger, MD 02/09/17

## 2017-02-09 NOTE — Assessment & Plan Note (Signed)
Treatment summary:  1. 06-2012 5 mm mass in the right breast at the 5:00 position. The biopsy showed invasive ductal carcinoma with associated DCIS tumor was ER +100% PR +60% Ki-67 6% and HER-2/neu negative. It was grade 1. 2. Central lumpectomy on 07/15/2012, pathology 0.6 cm ER positive PR positive HER-2/neu negative low grade invasive ductal carcinoma. Sentinel node was negative for metastatic disease.  3 radiation therapy to the right breast. She tolerated this well. 4. adjuvant antiestrogen therapy consisting of Arimidex 1 mg daily begun ~ 11-04-2012. Total of 5 years of therapy is planned  Arimidex toxicities: 1. Bone density 02/29/2016: T score -0.8 normal  Surveillance: 1. Mammograms: 06/30/2016: Benign 2. breast exam 02/09/2017: Benign

## 2017-02-13 ENCOUNTER — Other Ambulatory Visit: Payer: Self-pay | Admitting: Interventional Cardiology

## 2017-02-25 ENCOUNTER — Other Ambulatory Visit (HOSPITAL_COMMUNITY)
Admission: RE | Admit: 2017-02-25 | Discharge: 2017-02-25 | Disposition: A | Discharge status: Home / Self Care | Payer: BC Managed Care – PPO | Source: Ambulatory Visit | Attending: Family Medicine | Admitting: Family Medicine

## 2017-02-25 ENCOUNTER — Other Ambulatory Visit: Payer: Self-pay | Admitting: Family Medicine

## 2017-02-25 DIAGNOSIS — Z01411 Encounter for gynecological examination (general) (routine) with abnormal findings: Secondary | ICD-10-CM | POA: Diagnosis present

## 2017-02-27 LAB — CYTOLOGY - PAP: DIAGNOSIS: NEGATIVE

## 2017-04-02 ENCOUNTER — Other Ambulatory Visit: Payer: Self-pay | Admitting: Interventional Cardiology

## 2017-06-17 ENCOUNTER — Other Ambulatory Visit: Payer: Self-pay | Admitting: Hematology and Oncology

## 2017-06-17 DIAGNOSIS — Z853 Personal history of malignant neoplasm of breast: Secondary | ICD-10-CM

## 2017-06-17 DIAGNOSIS — Z9889 Other specified postprocedural states: Secondary | ICD-10-CM

## 2017-07-01 ENCOUNTER — Ambulatory Visit
Admission: RE | Admit: 2017-07-01 | Discharge: 2017-07-01 | Disposition: A | Discharge status: Home / Self Care | Payer: BC Managed Care – PPO | Source: Ambulatory Visit | Attending: Hematology and Oncology | Admitting: Hematology and Oncology

## 2017-07-01 DIAGNOSIS — Z9889 Other specified postprocedural states: Secondary | ICD-10-CM

## 2017-07-01 DIAGNOSIS — Z853 Personal history of malignant neoplasm of breast: Secondary | ICD-10-CM

## 2017-09-01 HISTORY — PX: BREAST BIOPSY: SHX20

## 2017-09-03 ENCOUNTER — Telehealth: Payer: Self-pay | Admitting: Interventional Cardiology

## 2017-09-03 ENCOUNTER — Other Ambulatory Visit: Payer: Self-pay | Admitting: Hematology and Oncology

## 2017-09-03 DIAGNOSIS — E782 Mixed hyperlipidemia: Secondary | ICD-10-CM

## 2017-09-03 DIAGNOSIS — I2510 Atherosclerotic heart disease of native coronary artery without angina pectoris: Secondary | ICD-10-CM

## 2017-09-03 DIAGNOSIS — C50111 Malignant neoplasm of central portion of right female breast: Secondary | ICD-10-CM

## 2017-09-03 NOTE — Telephone Encounter (Signed)
Patient made aware that CMET and fasting LIPIDS ordered for 01/04/18. Patient verbalized understanding and thanked me for the call.

## 2017-09-03 NOTE — Telephone Encounter (Signed)
Mrs.Moro is calling to get labs done before she sees Dr. Irish Lack on 01/11/18. Please Call . Thanks

## 2017-12-01 ENCOUNTER — Other Ambulatory Visit: Payer: Self-pay | Admitting: Hematology and Oncology

## 2017-12-01 DIAGNOSIS — C50111 Malignant neoplasm of central portion of right female breast: Secondary | ICD-10-CM

## 2018-01-04 ENCOUNTER — Other Ambulatory Visit: Payer: 59 | Admitting: *Deleted

## 2018-01-04 DIAGNOSIS — I2510 Atherosclerotic heart disease of native coronary artery without angina pectoris: Secondary | ICD-10-CM

## 2018-01-04 DIAGNOSIS — E782 Mixed hyperlipidemia: Secondary | ICD-10-CM

## 2018-01-04 LAB — LIPID PANEL
CHOL/HDL RATIO: 2.4 ratio (ref 0.0–4.4)
Cholesterol, Total: 115 mg/dL (ref 100–199)
HDL: 47 mg/dL (ref >39)
LDL Calculated: 54 mg/dL (ref 0–99)
TRIGLYCERIDES: 68 mg/dL (ref 0–149)
VLDL CHOLESTEROL CAL: 14 mg/dL (ref 5–40)

## 2018-01-04 LAB — COMPREHENSIVE METABOLIC PANEL
ALT: 23 IU/L (ref 0–32)
AST: 28 IU/L (ref 0–40)
Albumin/Globulin Ratio: 2 (ref 1.2–2.2)
Albumin: 4.4 g/dL (ref 3.5–5.5)
Alkaline Phosphatase: 60 IU/L (ref 39–117)
BUN/Creatinine Ratio: 10 (ref 9–23)
BUN: 8 mg/dL (ref 6–24)
Bilirubin Total: 0.3 mg/dL (ref 0.0–1.2)
CO2: 20 mmol/L (ref 20–29)
CREATININE: 0.79 mg/dL (ref 0.57–1.00)
Calcium: 9.7 mg/dL (ref 8.7–10.2)
Chloride: 105 mmol/L (ref 96–106)
GFR, EST AFRICAN AMERICAN: 95 mL/min/{1.73_m2} (ref >59)
GFR, EST NON AFRICAN AMERICAN: 83 mL/min/{1.73_m2} (ref >59)
GLUCOSE: 116 mg/dL — AB (ref 65–99)
Globulin, Total: 2.2 g/dL (ref 1.5–4.5)
POTASSIUM: 4.6 mmol/L (ref 3.5–5.2)
Sodium: 141 mmol/L (ref 134–144)
TOTAL PROTEIN: 6.6 g/dL (ref 6.0–8.5)

## 2018-01-08 NOTE — Progress Notes (Signed)
Cardiology Office Note   Date:  01/11/2018   ID:  Amber [name], DOB 07-10-1961, MRN [id number]  PCP:  Amber Ada, MD    No chief complaint on file.  CAD  Wt Readings from Last 3 Encounters:  01/11/18 148 lb (67.1 kg)  02/09/17 146 lb 1.6 oz (66.3 kg)  01/02/17 144 lb (65.3 kg)       History of Present Illness: Amber [name] is a 57 y.o. female  Who had an anterior MI in 2001. She had a stent placed by Dr. Leonia [name].     She had hip replacement in 12/16.   No heart issues at the time of the surgery.    At the time of her heart attack in 2001, she had nausea, diaphoresis and chest pain. SHe knew it was a heart attack.    She has stopped smoking in Feb 2018.   She has been bradycardic in the past.  Since the last visit, she has maintained smoking cessation.  SHe walks regularly.    Denies : Chest pain. Dizziness. Leg edema. Nitroglycerin use. Orthopnea. Palpitations. Paroxysmal nocturnal dyspnea. Shortness of breath. Syncope.   No sx like her prior MI.      Past Medical History:  Diagnosis Date  . Allergic rhinitis   . Anginal pain ([place]) 2001  . Breast cancer ([place])    invasive ductal carcinoma right breast; ER, PR + and HER 2 -  . Chronic neck pain    hx of  . DJD (degenerative joint disease) of hip    bil hip  . GERD (gastroesophageal reflux disease)    hx of   . Heart attack [facility]) 2001   Dr. Scarlette [name]  . History of radiation therapy 09/16/12- 10/19/12   right breast  . Hyperlipidemia   . Hypertension    Dr. Vic[facility]  . Hypothyroidism   . Migraines    "last one was 1990's" (08/17/2012)  . Neuromuscular disorder ([place])    carpal tunnel right hand  . Osteoporosis   . Pernicious anemia   . Sickle cell trait ([place])   . Sleep apnea    does use a cpap  . Tobacco abuse   . Wears glasses     Past Surgical History:  Procedure Laterality Date  . BREAST BIOPSY  06/2012   "right" (08/17/2012)  . COLONOSCOPY      . CORONARY ANGIOPLASTY WITH STENT PLACEMENT  2001   "1" (08/17/2012)  . DORSAL COMPARTMENT RELEASE Left 08/18/2014   Procedure: LEFT FIRST RELEASE DORSAL COMPARTMENT ([place]);  Surgeon: Amber Dibble, MD;  Location: [place];  Service: Orthopedics;  Laterality: Left;  . NASAL SINUS SURGERY  1990  . PARTIAL MASTECTOMY WITH NEEDLE LOCALIZATION AND AXILLARY SENTINEL LYMPH NODE BX  07/15/2012   Procedure: PARTIAL MASTECTOMY WITH NEEDLE LOCALIZATION AND AXILLARY SENTINEL LYMPH NODE BX;  Surgeon: Amber Hector, MD;  Location: [place];  Service: General;  Laterality: Right;  . TOTAL HIP ARTHROPLASTY  08/17/2012   "right" (08/17/2012)  . TOTAL HIP ARTHROPLASTY  08/17/2012   Procedure: TOTAL HIP ARTHROPLASTY ANTERIOR APPROACH;  Surgeon: Amber Dibble, MD;  Location: [facility];  Service: Orthopedics;  Laterality: Right;  . TOTAL HIP ARTHROPLASTY Left 08/21/2015  . TOTAL HIP ARTHROPLASTY Left 08/21/2015   Procedure: TOTAL HIP ARTHROPLASTY ANTERIOR APPROACH AND RIGHT TRIGGER THUMB RELEASE;  Surgeon: Amber Nakayama, MD;  Location: [place];  Service: Orthopedics;  Laterality: Left;  Current Outpatient Medications  Medication Sig Dispense Refill  . alendronate (FOSAMAX) 70 MG tablet TAKE ONE TABLET BY MOUTH ONCE A WEEK ON  SATURDAY 4 tablet 0  . amoxicillin (AMOXIL) 500 MG capsule Take 500 mg by mouth as directed. Takes for dental cleaning or any type of procedures may have    . anastrozole (ARIMIDEX) 1 MG tablet TAKE 1 TABLET BY MOUTH ONCE DAILY 90 tablet 0  . aspirin EC 81 MG tablet Take 81 mg by mouth daily.    Marland Kitchen atenolol (TENORMIN) 25 MG tablet TAKE ONE TABLET BY MOUTH ONCE DAILY 90 tablet 3  . BIOTIN PO Take 2 tablets by mouth daily.    . cholecalciferol (VITAMIN D) 1000 units tablet Take s 1000 units alternating with 2000 units every other day.    . Coenzyme Q10 (COQ10 PO) Take 1 tablet by mouth daily.    . cyanocobalamin (,VITAMIN B-12,) 1000 MCG/ML injection Inject  1,000 mcg into the muscle every 30 (thirty) days. Reported on 02/11/2016    . cyclobenzaprine (FLEXERIL) 5 MG tablet Take 5 mg by mouth 3 (three) times daily as needed for muscle spasms. Reported on 02/11/2016    . ezetimibe (ZETIA) 10 MG tablet Take 1 tablet (10 mg total) by mouth daily. 90 tablet 3  . GLUCOSAMINE-CHONDROITIN PO Take 1 tablet by mouth daily.    Marland Kitchen ibuprofen (ADVIL,MOTRIN) 800 MG tablet Take 800 mg by mouth as needed for pain.    . IRON PO Take 1 tablet by mouth at bedtime as needed. When feeling tired    . levothyroxine (SYNTHROID, LEVOTHROID) 100 MCG tablet Take 100 mcg by mouth daily before breakfast.    . lisinopril (PRINIVIL,ZESTRIL) 5 MG tablet TAKE ONE TABLET BY MOUTH ONCE DAILY 90 tablet 3  . methocarbamol (ROBAXIN) 500 MG tablet Take 1 tablet (500 mg total) by mouth every 6 (six) hours as needed for muscle spasms. 50 tablet 0  . mometasone (NASONEX) 50 MCG/ACT nasal spray Place 2 sprays into the nose daily as needed. For allergies    . nitroGLYCERIN (NITROSTAT) 0.4 MG SL tablet Place 0.4 mg under the tongue every 5 (five) minutes as needed for chest pain. Reported on 02/11/2016    . Omega-3 Fatty Acids (FISH OIL PO) Take 1 capsule by mouth daily.    . penicillin v potassium (VEETID) 500 MG tablet Per pt to take before dental appt and after    . rosuvastatin (CRESTOR) 20 MG tablet Take 20 mg by mouth every other day.    . triamcinolone (KENALOG) 0.1 % paste Use as directed 1 application in the mouth or throat as needed (FOR GUMS).     . valACYclovir (VALTREX) 500 MG tablet Take 500 mg by mouth daily as needed (Cold sore). Reported on 02/11/2016    . zolpidem (AMBIEN) 10 MG tablet Take 10 mg by mouth at bedtime as needed for sleep. Reported on 02/11/2016     No current facility-administered medications for this visit.     Allergies:   Tramadol; Benzoyl peroxide; and Betadine [povidone iodine]    Social History:  The patient  reports that she has been smoking cigarettes.  She  has a 35.00 pack-year smoking history. She has never used smokeless tobacco. She reports that she drinks alcohol. She reports that she does not use drugs.   Family History:  The patient's family history includes Cancer in her cousin and maternal grandmother; Diabetes in her mother; Heart disease in her mother; Parkinson's disease in her  maternal grandfather.    ROS:  Please see the history of present illness.   Otherwise, review of systems are positive for mild weight gain after smoking cessation- working to lose it.   All other systems are reviewed and negative.    PHYSICAL EXAM: VS:  BP (!) 112/50   Pulse (!) 48   Ht 5\' 5"  (1.651 m)   Wt 148 lb (67.1 kg)   SpO2 96%   BMI 24.63 kg/m  , BMI Body mass index is 24.63 kg/m. GEN: Well nourished, well developed, in no acute distress  HEENT: normal  Neck: no JVD, carotid bruits, or masses Cardiac: bradycardic; no murmurs, rubs, or gallops,no edema  Respiratory:  clear to auscultation bilaterally, normal work of breathing GI: soft, nontender, nondistended, + BS MS: no deformity or atrophy  Skin: warm and dry, no rash Neuro:  Strength and sensation are intact Psych: euthymic mood, full affect   EKG:   The ekg ordered today demonstrates sinus bradycardia, no ST segment changes   Recent Labs: 02/09/2017: HGB 12.8; Platelets 218 01/04/2018: ALT 23; BUN 8; Creatinine, Ser 0.79; Potassium 4.6; Sodium 141   Lipid Panel    Component Value Date/Time   CHOL 115 01/04/2018 0754   TRIG 68 01/04/2018 0754   HDL 47 01/04/2018 0754   CHOLHDL 2.4 01/04/2018 0754   CHOLHDL 2 11/02/2014 0806   VLDL 16.4 11/02/2014 0806   LDLCALC 54 01/04/2018 0754     Other studies Reviewed: Additional studies/ records that were reviewed today with results demonstrating: Glucose 116 in May 2019.  LDL 54, HDL 47, triglycerides 68.   ASSESSMENT AND PLAN:  1. CAD/Old MI: No angina on medical therapy.  Bradycardia unchanged from 2018.  COntinue regular  exercise. 2. Hyperlipidemia: LDL 54 in May 2019.  Continue Crestor. 3. HTN: Well controlled.  Continue current meds. 4. H/o tobacco abuse: She is maintained cessation of tobacco since February 2018. 5. Borderline glucose: She would benefit from checking a hemoglobin A1c.  Fasting blood sugar was improved on most recent check in 2019.   Current medicines are reviewed at length with the patient today.  The patient concerns regarding her medicines were addressed.  The following changes have been made:  No change  Labs/ tests ordered today include:  No orders of the defined types were placed in this encounter.   Recommend 150 minutes/week of aerobic exercise Low fat, low carb, high fiber diet recommended  Disposition:   FU in 1 year   Signed, Larae Grooms, MD  01/11/2018 8:19 AM    [facility] [address], [place], [place]  [address] Phone: [phone]; Fax: [phone]

## 2018-01-11 ENCOUNTER — Ambulatory Visit (INDEPENDENT_AMBULATORY_CARE_PROVIDER_SITE_OTHER): Payer: 59 | Admitting: Interventional Cardiology

## 2018-01-11 ENCOUNTER — Encounter: Payer: Self-pay | Admitting: Interventional Cardiology

## 2018-01-11 VITALS — BP 112/50 | HR 48 | Ht 65.0 in | Wt 148.0 lb

## 2018-01-11 DIAGNOSIS — I10 Essential (primary) hypertension: Secondary | ICD-10-CM

## 2018-01-11 DIAGNOSIS — E782 Mixed hyperlipidemia: Secondary | ICD-10-CM

## 2018-01-11 DIAGNOSIS — I25118 Atherosclerotic heart disease of native coronary artery with other forms of angina pectoris: Secondary | ICD-10-CM

## 2018-01-11 DIAGNOSIS — I252 Old myocardial infarction: Secondary | ICD-10-CM

## 2018-01-11 MED ORDER — LISINOPRIL 5 MG PO TABS: 5.0000 mg | ORAL_TABLET | Freq: Every day | ORAL | 3 refills | Status: DC

## 2018-01-11 MED ORDER — ROSUVASTATIN CALCIUM 20 MG PO TABS: 20.0000 mg | ORAL_TABLET | ORAL | 3 refills | Status: DC

## 2018-01-11 MED ORDER — ATENOLOL 25 MG PO TABS: 25.0000 mg | ORAL_TABLET | Freq: Every day | ORAL | 3 refills | Status: DC

## 2018-01-11 MED ORDER — EZETIMIBE 10 MG PO TABS
10.0000 mg | ORAL_TABLET | Freq: Every day | ORAL | 3 refills | Status: DC
Start: 2018-01-11 — End: 2018-12-29

## 2018-01-11 NOTE — Patient Instructions (Signed)

## 2018-03-13 ENCOUNTER — Other Ambulatory Visit: Payer: Self-pay | Admitting: Hematology and Oncology

## 2018-03-13 DIAGNOSIS — C50111 Malignant neoplasm of central portion of right female breast: Secondary | ICD-10-CM

## 2018-04-22 ENCOUNTER — Encounter: Payer: 59 | Attending: Family Medicine | Admitting: Dietician

## 2018-04-22 DIAGNOSIS — E119 Type 2 diabetes mellitus without complications: Secondary | ICD-10-CM

## 2018-04-22 DIAGNOSIS — Z713 Dietary counseling and surveillance: Secondary | ICD-10-CM | POA: Diagnosis not present

## 2018-04-29 ENCOUNTER — Encounter: Payer: Self-pay | Admitting: Dietician

## 2018-04-29 ENCOUNTER — Encounter: Payer: 59 | Admitting: Dietician

## 2018-04-29 DIAGNOSIS — E119 Type 2 diabetes mellitus without complications: Secondary | ICD-10-CM

## 2018-04-29 DIAGNOSIS — Z713 Dietary counseling and surveillance: Secondary | ICD-10-CM | POA: Diagnosis not present

## 2018-04-29 NOTE — Progress Notes (Signed)
Patient was seen on 04/29/18 for the first of a series of three diabetes self-management courses at the [facility].  Patient Education Plan per assessed needs and concerns is to attend three course education program for Diabetes Self Management Education.  The following learning objectives were met by the patient during this class:  Describe diabetes  State some common risk factors for diabetes  Defines the role of glucose and insulin  Identifies type of diabetes and pathophysiology  Describe the relationship between diabetes and cardiovascular risk  State the members of the Healthcare Team  States the rationale for glucose monitoring  State when to test glucose  State their individual Target Range  State the importance of logging glucose readings  Describe how to interpret glucose readings  Identifies A1C target  Explain the correlation between A1c and eAG values  State symptoms and treatment of high blood glucose  State symptoms and treatment of low blood glucose  Explain proper technique for glucose testing  Identifies proper sharps disposal  Handouts given during class include:  ADA Diabetes You Take Control   Carb Counting and Meal Planning book  Meal Plan Card  Meal planning worksheet  Low Sodium Flavoring Tips  Types of Fats  The diabetes portion plate  A1c to eAG Conversion Chart  Diabetes Recommended Care Schedule  Support Group  Diabetes Success Plan  Core Class Satisfaction Survey   Follow-Up Plan:  Attend core 2   

## 2018-04-29 NOTE — Progress Notes (Signed)
Patient was seen on 04/29/18 for the second of a series of three diabetes self-management courses at the [facility]. The following learning objectives were met by the patient during this class:   Describe the role of different macronutrients on glucose  Explain how carbohydrates affect blood glucose  State what foods contain the most carbohydrates  Demonstrate carbohydrate counting  Demonstrate how to read Nutrition Facts food label  Describe effects of various fats on heart health  Describe the importance of good nutrition for health and healthy eating strategies  Describe techniques for managing your shopping, cooking and meal planning  List strategies to follow meal plan when dining out  Describe the effects of alcohol on glucose and how to use it safely  Goals:  Follow Diabetes Meal Plan as instructed  Aim to spread carbs evenly throughout the day  Aim for 3 meals per day and snacks as needed Include lean protein foods to meals/snacks  Monitor glucose levels as instructed by your doctor   Follow-Up Plan:  Attend Core 3  Work towards following your personal food plan.   

## 2018-05-06 ENCOUNTER — Encounter: Payer: Self-pay | Admitting: Dietician

## 2018-05-06 ENCOUNTER — Encounter: Payer: 59 | Attending: Family Medicine | Admitting: Dietician

## 2018-05-06 DIAGNOSIS — E119 Type 2 diabetes mellitus without complications: Secondary | ICD-10-CM | POA: Diagnosis not present

## 2018-05-06 DIAGNOSIS — Z713 Dietary counseling and surveillance: Secondary | ICD-10-CM | POA: Diagnosis not present

## 2018-05-06 NOTE — Progress Notes (Signed)
Patient was seen on 05/06/18 for the third of a series of three diabetes self-management courses at the [facility].   Catalina [place] the amount of activity recommended for healthy living . Describe activities suitable for individual needs . Identify ways to regularly incorporate activity into daily life . Identify barriers to activity and ways to over come these barriers  Identify diabetes medications being personally used and their primary action for lowering glucose and possible side effects . Describe role of stress on blood glucose and develop strategies to address psychosocial issues . Identify diabetes complications and ways to prevent them  Explain how to manage diabetes during illness . Evaluate success in meeting personal goal . Establish 2-3 goals that they will plan to diligently work on  Goals:   I will use the portion plate at my meals  I will be active 60 minutes or more 3 times a week  I will take my diabetes medications as scheduled  I will test my glucose at least 1 times a day, 7 days a week  I will look at patterns in my record book at least 3 days a month  I will look for ways to get more/better sleep  To help manage stress I will work out at least 3 times a week  To help manage stress I will practice deep breathing  Your patient has identified these potential barriers to change:  Finances Stress  Your patient has identified their diabetes self-care support plan as  On-line Resources   American Diabetes Association Website    Plan:  Attend Support Group as desired

## 2018-05-26 ENCOUNTER — Other Ambulatory Visit: Payer: Self-pay | Admitting: Hematology and Oncology

## 2018-05-26 ENCOUNTER — Telehealth: Payer: Self-pay

## 2018-05-26 ENCOUNTER — Other Ambulatory Visit: Payer: Self-pay

## 2018-05-26 DIAGNOSIS — E2839 Other primary ovarian failure: Secondary | ICD-10-CM

## 2018-05-26 DIAGNOSIS — Z1231 Encounter for screening mammogram for malignant neoplasm of breast: Secondary | ICD-10-CM

## 2018-05-26 NOTE — Progress Notes (Signed)
Bone density order per pt request.

## 2018-07-02 ENCOUNTER — Ambulatory Visit: Payer: 59

## 2018-07-19 ENCOUNTER — Other Ambulatory Visit: Payer: 59

## 2018-07-19 ENCOUNTER — Ambulatory Visit: Payer: 59

## 2018-09-08 ENCOUNTER — Ambulatory Visit
Admission: RE | Admit: 2018-09-08 | Discharge: 2018-09-08 | Disposition: A | Discharge status: Home / Self Care | Payer: BC Managed Care – PPO | Source: Ambulatory Visit | Attending: Hematology and Oncology | Admitting: Hematology and Oncology

## 2018-09-08 DIAGNOSIS — E2839 Other primary ovarian failure: Secondary | ICD-10-CM

## 2018-09-08 DIAGNOSIS — Z1231 Encounter for screening mammogram for malignant neoplasm of breast: Secondary | ICD-10-CM

## 2018-09-14 ENCOUNTER — Telehealth: Payer: Self-pay | Admitting: Interventional Cardiology

## 2018-09-14 NOTE — Telephone Encounter (Signed)
New message   Patient states that she had blood work done on last week at PCP and wants to know if it is necessary to have blood work done before Appt in May. Please advise.

## 2018-09-14 NOTE — Telephone Encounter (Signed)
Left detailed message letting the patient know that we are able to see her labs recently done in January in [facility] and that she does not need a lab appointment prior to her OV in May. Instructed for the patient to call back with any questions.

## 2018-10-12 ENCOUNTER — Encounter: Payer: Self-pay | Admitting: Neurology

## 2018-12-23 ENCOUNTER — Ambulatory Visit: Payer: BC Managed Care – PPO | Admitting: Neurology

## 2018-12-24 ENCOUNTER — Ambulatory Visit: Payer: BC Managed Care – PPO | Admitting: Neurology

## 2018-12-28 ENCOUNTER — Telehealth: Payer: Self-pay

## 2018-12-28 NOTE — Telephone Encounter (Signed)
Virtual Visit Pre-Appointment Phone Call  TELEPHONE CALL NOTE  Amber [name] has been deemed a candidate for a follow-up tele-health visit to limit community exposure during the Covid-19 pandemic. I spoke with the patient via phone to ensure availability of phone/video source, confirm preferred email & phone number, and discuss instructions and expectations.  I reminded Amber [name] to be prepared with any vital sign and/or heart rhythm information that could potentially be obtained via home monitoring, at the time of her visit. I reminded Amber [name] to expect a phone call prior to her visit.  Patient agrees to consent below.  Cleon Gustin, RN 12/28/2018 3:17 PM   FULL LENGTH CONSENT FOR TELE-HEALTH VISIT   I hereby voluntarily request, consent and authorize [facility] and its employed or contracted physicians, physician assistants, nurse practitioners or other licensed health care professionals (the Practitioner), to provide me with telemedicine health care services (the "Services") as deemed necessary by the treating Practitioner. I acknowledge and consent to receive the Services by the Practitioner via telemedicine. I understand that the telemedicine visit will involve communicating with the Practitioner through live audiovisual communication technology and the disclosure of certain medical information by electronic transmission. I acknowledge that I have been given the opportunity to request an in-person assessment or other available alternative prior to the telemedicine visit and am voluntarily participating in the telemedicine visit.  I understand that I have the right to withhold or withdraw my consent to the use of telemedicine in the course of my care at any time, without affecting my right to future care or treatment, and that the Practitioner or I may terminate the telemedicine visit at any time. I understand that I have the right to inspect all information  obtained and/or recorded in the course of the telemedicine visit and may receive copies of available information for a reasonable fee.  I understand that some of the potential risks of receiving the Services via telemedicine include:  Marland Kitchen Delay or interruption in medical evaluation due to technological equipment failure or disruption; . Information transmitted may not be sufficient (e.g. poor resolution of images) to allow for appropriate medical decision making by the Practitioner; and/or  . In rare instances, security protocols could fail, causing a breach of personal health information.  Furthermore, I acknowledge that it is my responsibility to provide information about my medical history, conditions and care that is complete and accurate to the best of my ability. I acknowledge that Practitioner's advice, recommendations, and/or decision may be based on factors not within their control, such as incomplete or inaccurate data provided by me or distortions of diagnostic images or specimens that may result from electronic transmissions. I understand that the practice of medicine is not an exact science and that Practitioner makes no warranties or guarantees regarding treatment outcomes. I acknowledge that I will receive a copy of this consent concurrently upon execution via email to the email address I last provided but may also request a printed copy by calling the office of [facility].    I understand that my insurance will be billed for this visit.   I have read or had this consent read to me. . I understand the contents of this consent, which adequately explains the benefits and risks of the Services being provided via telemedicine.  . I have been provided ample opportunity to ask questions regarding this consent and the Services and have had my questions answered to my satisfaction. Marland Kitchen I  give my informed consent for the services to be provided through the use of telemedicine in my medical care   By participating in this telemedicine visit I agree to the above.

## 2018-12-29 ENCOUNTER — Encounter: Payer: Self-pay | Admitting: Physician Assistant

## 2018-12-29 ENCOUNTER — Telehealth (INDEPENDENT_AMBULATORY_CARE_PROVIDER_SITE_OTHER): Payer: BC Managed Care – PPO | Admitting: Physician Assistant

## 2018-12-29 ENCOUNTER — Other Ambulatory Visit: Payer: Self-pay

## 2018-12-29 VITALS — Ht 65.0 in | Wt 145.0 lb

## 2018-12-29 DIAGNOSIS — I10 Essential (primary) hypertension: Secondary | ICD-10-CM | POA: Diagnosis not present

## 2018-12-29 DIAGNOSIS — R001 Bradycardia, unspecified: Secondary | ICD-10-CM

## 2018-12-29 DIAGNOSIS — Z9989 Dependence on other enabling machines and devices: Secondary | ICD-10-CM

## 2018-12-29 DIAGNOSIS — G4733 Obstructive sleep apnea (adult) (pediatric): Secondary | ICD-10-CM

## 2018-12-29 DIAGNOSIS — E785 Hyperlipidemia, unspecified: Secondary | ICD-10-CM | POA: Diagnosis not present

## 2018-12-29 DIAGNOSIS — I2510 Atherosclerotic heart disease of native coronary artery without angina pectoris: Secondary | ICD-10-CM | POA: Diagnosis not present

## 2018-12-29 DIAGNOSIS — I252 Old myocardial infarction: Secondary | ICD-10-CM | POA: Diagnosis not present

## 2018-12-29 DIAGNOSIS — Z955 Presence of coronary angioplasty implant and graft: Secondary | ICD-10-CM

## 2018-12-29 MED ORDER — ASPIRIN EC 81 MG PO TBEC: 81.0000 mg | DELAYED_RELEASE_TABLET | Freq: Every day | ORAL | 3 refills | Status: AC

## 2018-12-29 MED ORDER — ATENOLOL 25 MG PO TABS: 25.0000 mg | ORAL_TABLET | Freq: Every day | ORAL | 3 refills | Status: DC

## 2018-12-29 MED ORDER — EZETIMIBE 10 MG PO TABS: 10.0000 mg | ORAL_TABLET | Freq: Every day | ORAL | 3 refills | Status: DC

## 2018-12-29 MED ORDER — ROSUVASTATIN CALCIUM 20 MG PO TABS: 20.0000 mg | ORAL_TABLET | ORAL | 3 refills | Status: DC

## 2018-12-29 MED ORDER — LISINOPRIL 5 MG PO TABS: 5.0000 mg | ORAL_TABLET | Freq: Every day | ORAL | 3 refills | Status: DC

## 2018-12-29 NOTE — Patient Instructions (Signed)

## 2018-12-29 NOTE — Progress Notes (Signed)
Virtual Visit via Video Note   This visit type was conducted due to national recommendations for restrictions regarding the COVID-19 Pandemic (e.g. social distancing) in an effort to limit this patient's exposure and mitigate transmission in our community.  Due to her co-morbid illnesses, this patient is at least at moderate risk for complications without adequate follow up.  This format is felt to be most appropriate for this patient at this time.  All issues noted in this document were discussed and addressed.  A limited physical exam was performed with this format.  Please refer to the patient's chart for her consent to telehealth for [facility]. Converted to phone visit since audio wasn't working.  Evaluation Performed:  Follow-up visit  Date:  12/29/2018   ID:  Amber [name], DOB [birth year], MRN [id number]  Patient Location: Home Provider Location: Home  PCP:  Jettie Booze, MD  Cardiologist:  Larae Grooms, MD  Electrophysiologist:  None   Chief Complaint:  f/u  History of Present Illness:    Amber [name] is a 58 y.o. female with history of CAD S/P anterior MI treated with DES 2001, history of bradycardia, quit smoking 2018, HTN, HLD, OSA on CPAP.  Last saw Dr. Irish Lack 01/11/18 and doing well.   Denies chest pain, shortness of breath, dizziness, edema, presyncope, using CPAP-but would like an upgrade. Works at a [employer].Walked a mile today.  The patient does not have symptoms concerning for COVID-19 infection (fever, chills, cough, or new shortness of breath).    Past Medical History:  Diagnosis Date  . Allergic rhinitis   . Anginal pain ([place]) 2001  . Breast cancer ([place])    invasive ductal carcinoma right breast; ER, PR + and HER 2 -  . Chronic neck pain    hx of  . Diabetes mellitus without complication ([place])   . DJD (degenerative joint disease) of hip    bil hip  . GERD (gastroesophageal reflux disease)    hx of   . Heart attack [facility])  2001   Dr. Scarlette Calico  . History of radiation therapy 09/16/12- 10/19/12   right breast  . Hyperlipidemia   . Hypertension    Dr. Vic[facility]  . Hypothyroidism   . Migraines    "last one was 1990's" (08/17/2012)  . Neuromuscular disorder ([place])    carpal tunnel right hand  . Osteoporosis   . Pernicious anemia   . Sickle cell trait ([place])   . Sleep apnea    does use a cpap  . Stroke ([place])   . Tobacco abuse   . Wears glasses    Past Surgical History:  Procedure Laterality Date  . BREAST BIOPSY  06/2012   "right" (08/17/2012)  . COLONOSCOPY    . CORONARY ANGIOPLASTY WITH STENT PLACEMENT  2001   "1" (08/17/2012)  . DORSAL COMPARTMENT RELEASE Left 08/18/2014   Procedure: LEFT FIRST RELEASE DORSAL COMPARTMENT ([place]);  Surgeon: Hessie Dibble, MD;  Location: [facility];  Service: Orthopedics;  Laterality: Left;  . NASAL SINUS SURGERY  1990  . PARTIAL MASTECTOMY WITH NEEDLE LOCALIZATION AND AXILLARY SENTINEL LYMPH NODE BX  07/15/2012   Procedure: PARTIAL MASTECTOMY WITH NEEDLE LOCALIZATION AND AXILLARY SENTINEL LYMPH NODE BX;  Surgeon: Adin Hector, MD;  Location: [facility];  Service: General;  Laterality: Right;  . TOTAL HIP ARTHROPLASTY  08/17/2012   "right" (08/17/2012)  . TOTAL HIP ARTHROPLASTY  08/17/2012   Procedure: TOTAL HIP ARTHROPLASTY ANTERIOR  APPROACH;  Surgeon: Hessie Dibble, MD;  Location: [facility];  Service: Orthopedics;  Laterality: Right;  . TOTAL HIP ARTHROPLASTY Left 08/21/2015  . TOTAL HIP ARTHROPLASTY Left 08/21/2015   Procedure: TOTAL HIP ARTHROPLASTY ANTERIOR APPROACH AND RIGHT TRIGGER THUMB RELEASE;  Surgeon: Melrose Nakayama, MD;  Location: [facility];  Service: Orthopedics;  Laterality: Left;     Current Meds  Medication Sig  . alendronate (FOSAMAX) 70 MG tablet TAKE ONE TABLET BY MOUTH ONCE A WEEK ON  SATURDAY  . amoxicillin (AMOXIL) 500 MG capsule Take 500 mg by mouth as directed. Takes for dental cleaning or any type  of procedures may have  . aspirin EC 81 MG tablet Take 1 tablet (81 mg total) by mouth daily.  Marland Kitchen atenolol (TENORMIN) 25 MG tablet Take 1 tablet (25 mg total) by mouth daily.  Marland Kitchen BIOTIN PO Take 2 tablets by mouth daily.  . cholecalciferol (VITAMIN D) 1000 units tablet Take 1,000 Units by mouth daily.   . Coenzyme Q10 (COQ10 PO) Take 1 tablet by mouth daily.  . cyanocobalamin (,VITAMIN B-12,) 1000 MCG/ML injection Inject 1,000 mcg into the muscle every 30 (thirty) days. Reported on 02/11/2016  . cyclobenzaprine (FLEXERIL) 5 MG tablet Take 5 mg by mouth 3 (three) times daily as needed for muscle spasms. Reported on 02/11/2016  . ezetimibe (ZETIA) 10 MG tablet Take 1 tablet (10 mg total) by mouth daily.  . IRON PO Take 1 tablet by mouth at bedtime as needed. When feeling tired  . levothyroxine (SYNTHROID) 88 MCG tablet Take 88 mcg by mouth daily before breakfast.  . lisinopril (ZESTRIL) 5 MG tablet Take 1 tablet (5 mg total) by mouth daily.  . mometasone (NASONEX) 50 MCG/ACT nasal spray Place 2 sprays into the nose daily as needed. For allergies  . nitroGLYCERIN (NITROSTAT) 0.4 MG SL tablet Place 0.4 mg under the tongue every 5 (five) minutes as needed for chest pain. Reported on 02/11/2016  . Omega-3 Fatty Acids (FISH OIL PO) Take 1 capsule by mouth daily.  . penicillin v potassium (VEETID) 500 MG tablet Per pt to take before dental appt and after  . rosuvastatin (CRESTOR) 20 MG tablet Take 1 tablet (20 mg total) by mouth every other day.  . triamcinolone (KENALOG) 0.1 % paste Use as directed 1 application in the mouth or throat as needed (FOR GUMS).   . valACYclovir (VALTREX) 500 MG tablet Take 500 mg by mouth daily as needed (Cold sore). Reported on 02/11/2016  . zolpidem (AMBIEN) 10 MG tablet Take 10 mg by mouth at bedtime as needed for sleep. Reported on 02/11/2016  . [DISCONTINUED] aspirin EC 81 MG tablet Take 81 mg by mouth daily.  . [DISCONTINUED] atenolol (TENORMIN) 25 MG tablet Take 1 tablet (25  mg total) by mouth daily.  . [DISCONTINUED] ezetimibe (ZETIA) 10 MG tablet Take 1 tablet (10 mg total) by mouth daily.  . [DISCONTINUED] lisinopril (PRINIVIL,ZESTRIL) 5 MG tablet Take 1 tablet (5 mg total) by mouth daily.  . [DISCONTINUED] rosuvastatin (CRESTOR) 20 MG tablet Take 1 tablet (20 mg total) by mouth every other day.     Allergies:   Tramadol; Benzoyl peroxide; and Betadine [povidone iodine]   Social History   Tobacco Use  . Smoking status: Current Some Day Smoker    Packs/day: 1.00    Years: 35.00    Pack years: 35.00    Types: Cigarettes  . Smokeless tobacco: Never Used  Substance Use Topics  . Alcohol use: Yes  Comment: 08/17/2012 "couple mixed drinks; couple times/month; sometimes it's beer"  . Drug use: No     Family Hx: The patient's family history includes Cancer in her cousin and maternal grandmother; Diabetes in her mother; Heart disease in her mother; Parkinson's disease in her maternal grandfather.  ROS:   Please see the history of present illness.    Review of Systems  Constitution: Negative.  HENT: Negative.   Eyes: Negative.   Cardiovascular: Negative.   Respiratory: Negative.   Hematologic/Lymphatic: Negative.   Musculoskeletal: Negative.  Negative for joint pain.  Gastrointestinal: Negative.   Genitourinary: Negative.   Neurological: Negative.     All other systems reviewed and are negative.   Prior CV studies:   The following studies were reviewed today:    Labs/Other Tests and Data Reviewed:    EKG:  An ECG dated 01/11/18 was personally reviewed today and demonstrated:  Sinus bradycardia at 48/m with nonspecific TW abnormality  Recent Labs: 01/04/2018: ALT 23; BUN 8; Creatinine, Ser 0.79; Potassium 4.6; Sodium 141   Recent Lipid Panel Lab Results  Component Value Date/Time   CHOL 115 01/04/2018 07:54 AM   TRIG 68 01/04/2018 07:54 AM   HDL 47 01/04/2018 07:54 AM   CHOLHDL 2.4 01/04/2018 07:54 AM   CHOLHDL 2 11/02/2014 08:06 AM    LDLCALC 54 01/04/2018 07:54 AM    Wt Readings from Last 3 Encounters:  12/29/18 145 lb (65.8 kg)  01/11/18 148 lb (67.1 kg)  02/09/17 146 lb 1.6 oz (66.3 kg)     Objective:    Vital Signs:  Ht 5\' 5"  (1.651 m)   Wt 145 lb (65.8 kg)   BMI 24.13 kg/m    VITAL SIGNS:  reviewed GEN:  no acute distress RESPIRATORY:  normal respiratory effort, symmetric expansion  ASSESSMENT & PLAN:    1. CAD S/P Stent LAD 2001-no angina 2. Essential HTN-hasn't had it checked in a long time 3. HLD-LDL 63 09/2018 4. History of bradycardia-asymptomatic 5. OSA on CPAP-wants an upgrade. Checking with PCP to see who ordered it originally. Not being followed by anyone. Can refer to Dr. In our group if needed.  COVID-19 Education: The signs and symptoms of COVID-19 were discussed with the patient and how to seek care for testing (follow up with PCP or arrange E-visit).   The importance of social distancing was discussed today.  Time:   Today, I have spent 15  minutes with the patient with telehealth technology discussing the above problems.     Medication Adjustments/Labs and Tests Ordered: Current medicines are reviewed at length with the patient today.  Concerns regarding medicines are outlined above.   Tests Ordered: No orders of the defined types were placed in this encounter.   Medication Changes: Meds ordered this encounter  Medications  . aspirin EC 81 MG tablet    Sig: Take 1 tablet (81 mg total) by mouth daily.    Dispense:  90 tablet    Refill:  3  . atenolol (TENORMIN) 25 MG tablet    Sig: Take 1 tablet (25 mg total) by mouth daily.    Dispense:  90 tablet    Refill:  3  . ezetimibe (ZETIA) 10 MG tablet    Sig: Take 1 tablet (10 mg total) by mouth daily.    Dispense:  90 tablet    Refill:  3  . lisinopril (ZESTRIL) 5 MG tablet    Sig: Take 1 tablet (5 mg total) by mouth daily.  Dispense:  90 tablet    Refill:  3  . rosuvastatin (CRESTOR) 20 MG tablet    Sig: Take 1 tablet  (20 mg total) by mouth every other day.    Dispense:  45 tablet    Refill:  3    Disposition:  Follow up in 1 year(s) Dr. Irish Lack  Signed, Ermalinda Barrios, PA-C  12/29/2018 2:51 PM    [place]

## 2019-01-19 ENCOUNTER — Ambulatory Visit: Payer: BC Managed Care – PPO | Admitting: Interventional Cardiology

## 2019-02-21 NOTE — Progress Notes (Signed)
NEUROLOGY CONSULTATION NOTE  Amber [name] MRN: [id number] DOB: Oct 26, 1960  Referring provider: [name], MD Primary care provider: [name], MD  Reason for consult:  paresthesias  HISTORY OF PRESENT ILLNESS: Amber [name] is a 58 year old right-handed black woman with CAD, type 2 diabetes mellitus, HTN, hyperlipidemia, tobacco use disorder and history of stroke and breast cancer (no chemotherapy) who presents for paresthesias.  For a couple of years, she experienced intermittent discomfort (tingling/burning) in feet and legs, particularly at night.  Last year, she was diagnosed with diabetes.  Since then, she has been having neuropathy (right foot worse than left foot).  It feels like pins and needles in the feet as well as burning.  Big toe on the right feels tender.  Notes some numbness.  No falls.  She feels that the symptoms are unchanged.    Her blood sugars have been good.  Typically morning readings are less than 130 and second reading 150.  Occasionally in the 170s.  Hgb A1c from 09/09/18 was 6.3.  She also has history of B12 deficiency/pernicious anemia for which she takes injections and over the counter pills.  Level from 09/09/18 was >1525.   She was told to stop the oral pills and symptoms worsened.  She has history of hypothyroidism well controlled.  TSH from 09/09/18 was 2.76.    PAST MEDICAL HISTORY: Past Medical History:  Diagnosis Date  . Allergic rhinitis   . Anginal pain ([place]) 2001  . Breast cancer ([place])    invasive ductal carcinoma right breast; ER, PR + and HER 2 -  . Chronic neck pain    hx of  . Diabetes mellitus without complication ([place])   . DJD (degenerative joint disease) of hip    bil hip  . GERD (gastroesophageal reflux disease)    hx of   . Heart attack [facility]) 2001   Dr. Scarlette Calico  . History of radiation therapy 09/16/12- 10/19/12   right breast  . Hyperlipidemia   . Hypertension    Dr. [facility]  . Hypothyroidism    . Migraines    "last one was 1990's" (08/17/2012)  . Neuromuscular disorder ([place])    carpal tunnel right hand  . Osteoporosis   . Pernicious anemia   . Sickle cell trait ([place])   . Sleep apnea    does use a cpap  . Stroke ([place])   . Tobacco abuse   . Wears glasses     PAST SURGICAL HISTORY: Past Surgical History:  Procedure Laterality Date  . BREAST BIOPSY  06/2012   "right" (08/17/2012)  . COLONOSCOPY    . CORONARY ANGIOPLASTY WITH STENT PLACEMENT  2001   "1" (08/17/2012)  . DORSAL COMPARTMENT RELEASE Left 08/18/2014   Procedure: LEFT FIRST RELEASE DORSAL COMPARTMENT ([place]);  Surgeon: Hessie Dibble, MD;  Location: [place];  Service: Orthopedics;  Laterality: Left;  . NASAL SINUS SURGERY  1990  . PARTIAL MASTECTOMY WITH NEEDLE LOCALIZATION AND AXILLARY SENTINEL LYMPH NODE BX  07/15/2012   Procedure: PARTIAL MASTECTOMY WITH NEEDLE LOCALIZATION AND AXILLARY SENTINEL LYMPH NODE BX;  Surgeon: Adin Hector, MD;  Location: [place];  Service: General;  Laterality: Right;  . TOTAL HIP ARTHROPLASTY  08/17/2012   "right" (08/17/2012)  . TOTAL HIP ARTHROPLASTY  08/17/2012   Procedure: TOTAL HIP ARTHROPLASTY ANTERIOR APPROACH;  Surgeon: Hessie Dibble, MD;  Location: [place];  Service: Orthopedics;  Laterality: Right;  . TOTAL HIP ARTHROPLASTY Left  08/21/2015  . TOTAL HIP ARTHROPLASTY Left 08/21/2015   Procedure: TOTAL HIP ARTHROPLASTY ANTERIOR APPROACH AND RIGHT TRIGGER THUMB RELEASE;  Surgeon: Melrose Nakayama, MD;  Location: [place];  Service: Orthopedics;  Laterality: Left;    MEDICATIONS: Current Outpatient Medications on File Prior to Visit  Medication Sig Dispense Refill  . alendronate (FOSAMAX) 70 MG tablet TAKE ONE TABLET BY MOUTH ONCE A WEEK ON  SATURDAY 4 tablet 0  . amoxicillin (AMOXIL) 500 MG capsule Take 500 mg by mouth as directed. Takes for dental cleaning or any type of procedures may have    . aspirin EC 81 MG tablet Take 1 tablet (81 mg total)  by mouth daily. 90 tablet 3  . atenolol (TENORMIN) 25 MG tablet Take 1 tablet (25 mg total) by mouth daily. 90 tablet 3  . BIOTIN PO Take 2 tablets by mouth daily.    . cholecalciferol (VITAMIN D) 1000 units tablet Take 1,000 Units by mouth daily.     . Coenzyme Q10 (COQ10 PO) Take 1 tablet by mouth daily.    . cyanocobalamin (,VITAMIN B-12,) 1000 MCG/ML injection Inject 1,000 mcg into the muscle every 30 (thirty) days. Reported on 02/11/2016    . cyclobenzaprine (FLEXERIL) 5 MG tablet Take 5 mg by mouth 3 (three) times daily as needed for muscle spasms. Reported on 02/11/2016    . ezetimibe (ZETIA) 10 MG tablet Take 1 tablet (10 mg total) by mouth daily. 90 tablet 3  . IRON PO Take 1 tablet by mouth at bedtime as needed. When feeling tired    . levothyroxine (SYNTHROID) 88 MCG tablet Take 88 mcg by mouth daily before breakfast.    . lisinopril (ZESTRIL) 5 MG tablet Take 1 tablet (5 mg total) by mouth daily. 90 tablet 3  . mometasone (NASONEX) 50 MCG/ACT nasal spray Place 2 sprays into the nose daily as needed. For allergies    . nitroGLYCERIN (NITROSTAT) 0.4 MG SL tablet Place 0.4 mg under the tongue every 5 (five) minutes as needed for chest pain. Reported on 02/11/2016    . Omega-3 Fatty Acids (FISH OIL PO) Take 1 capsule by mouth daily.    . penicillin v potassium (VEETID) 500 MG tablet Per pt to take before dental appt and after    . rosuvastatin (CRESTOR) 20 MG tablet Take 1 tablet (20 mg total) by mouth every other day. 45 tablet 3  . triamcinolone (KENALOG) 0.1 % paste Use as directed 1 application in the mouth or throat as needed (FOR GUMS).     . valACYclovir (VALTREX) 500 MG tablet Take 500 mg by mouth daily as needed (Cold sore). Reported on 02/11/2016    . zolpidem (AMBIEN) 10 MG tablet Take 10 mg by mouth at bedtime as needed for sleep. Reported on 02/11/2016     No current facility-administered medications on file prior to visit.     ALLERGIES: Allergies  Allergen Reactions  .  Tramadol Nausea And Vomiting    "couldn't stop vomiting" (08/17/2012)  . Benzoyl Peroxide Itching and Rash  . Betadine [Povidone Iodine] Rash    FAMILY HISTORY: Family History  Problem Relation Age of Onset  . Diabetes Mother   . Heart disease Mother   . Cancer Maternal Grandmother        pancreatic  . Cancer Cousin        pancreatic  . Parkinson's disease Maternal Grandfather    SOCIAL HISTORY: Social History   Socioeconomic History  . Marital status: Single  Spouse name: Not on file  . Number of children: Not on file  . Years of education: Not on file  . Highest education level: Not on file  Occupational History  . Not on file  Social Needs  . Financial resource strain: Not on file  . Food insecurity    Worry: Not on file    Inability: Not on file  . Transportation needs    Medical: Not on file    Non-medical: Not on file  Tobacco Use  . Smoking status: Current Some Day Smoker    Packs/day: 1.00    Years: 35.00    Pack years: 35.00    Types: Cigarettes  . Smokeless tobacco: Never Used  Substance and Sexual Activity  . Alcohol use: Yes    Comment: 08/17/2012 "couple mixed drinks; couple times/month; sometimes it's beer"  . Drug use: No  . Sexual activity: Not Currently  Lifestyle  . Physical activity    Days per week: Not on file    Minutes per session: Not on file  . Stress: Not on file  Relationships  . Social Herbalist on phone: Not on file    Gets together: Not on file    Attends religious service: Not on file    Active member of club or organization: Not on file    Attends meetings of clubs or organizations: Not on file    Relationship status: Not on file  . Intimate partner violence    Fear of current or ex partner: Not on file    Emotionally abused: Not on file    Physically abused: Not on file    Forced sexual activity: Not on file  Other Topics Concern  . Not on file  Social History Narrative  . Not on file    REVIEW OF  SYSTEMS: Constitutional: No fevers, chills, or sweats, no generalized fatigue, change in appetite Eyes: No visual changes, double vision, eye pain Ear, nose and throat: No hearing loss, ear pain, nasal congestion, sore throat Cardiovascular: No chest pain, palpitations Respiratory:  No shortness of breath at rest or with exertion, wheezes GastrointestinaI: No nausea, vomiting, diarrhea, abdominal pain, fecal incontinence Genitourinary:  No dysuria, urinary retention or frequency Musculoskeletal:  No neck pain, back pain Integumentary: No rash, pruritus, skin lesions Neurological: as above Psychiatric: No depression, insomnia, anxiety Endocrine: No palpitations, fatigue, diaphoresis, mood swings, change in appetite, change in weight, increased thirst Hematologic/Lymphatic:  No purpura, petechiae. Allergic/Immunologic: no itchy/runny eyes, nasal congestion, recent allergic reactions, rashes  PHYSICAL EXAM: Blood pressure 112/64, pulse 74, weight 152 lb (68.9 kg), SpO2 97 %. General: No acute distress.  Patient appears well-groomed.  Head:  Normocephalic/atraumatic Eyes:  fundi examined but not visualized Neck: supple, no paraspinal tenderness, full range of motion Back: No paraspinal tenderness Heart: regular rate and rhythm Lungs: Clear to auscultation bilaterally. Vascular: No carotid bruits. Neurological Exam: Mental status: alert and oriented to person, place, and time, recent and remote memory intact, fund of knowledge intact, attention and concentration intact, speech fluent and not dysarthric, language intact. Cranial nerves: CN I: not tested CN II: pupils equal, round and reactive to light, visual fields intact CN III, IV, VI:  full range of motion, no nystagmus, no ptosis CN V: facial sensation intact CN VII: upper and lower face symmetric CN VIII: hearing intact CN IX, X: gag intact, uvula midline CN XI: sternocleidomastoid and trapezius muscles intact CN XII: tongue  midline Bulk & Tone: normal, no  fasciculations. Motor:  5/5 throughout Sensation:  Pinprick sensation reduced in feet up to ankles; vibration sensation intact. Deep Tendon Reflexes:  2+ throughout except absent in ankles, toes downgoing. Finger to nose testing:  Without dysmetria.  Heel to shin:  Without dysmetria.  Gait:  Normal station and stride.  Able to turn and tandem walk. Romberg negative.  IMPRESSION: Diabetic polyneuropathy  PLAN: 1.  Start gabapentin 100mg  at bedtime, titrating to 300mg  at bedtime.  However, may remain at lowest therapeutic dose.  Advised to contact us for refill and whether we need to increase dose. 2.  Continue glycemic management 3.  Follow up in 4 months.  Thank you for allowing me to take part in the care of this patient.  Metta Clines, DO  CC: [name], MD

## 2019-02-22 ENCOUNTER — Encounter: Payer: Self-pay | Admitting: Neurology

## 2019-02-22 ENCOUNTER — Other Ambulatory Visit: Payer: Self-pay

## 2019-02-22 ENCOUNTER — Ambulatory Visit: Payer: BC Managed Care – PPO | Admitting: Neurology

## 2019-02-22 VITALS — BP 112/64 | HR 74 | Wt 152.0 lb

## 2019-02-22 DIAGNOSIS — E1142 Type 2 diabetes mellitus with diabetic polyneuropathy: Secondary | ICD-10-CM

## 2019-02-22 MED ORDER — GABAPENTIN 100 MG PO CAPS: ORAL_CAPSULE | ORAL | 0 refills | Status: DC

## 2019-02-22 NOTE — Patient Instructions (Signed)
1.  Start gabapentin 100mg  capsule:  Take 1 capsule at bedtime for 7 days  Then 2 capsules at bedtime for 7 days  Then 3 capsules at bedtime  You may remain at lowest therapeutic dose 2.  Contact me for refill and whether we need to increase dose 3.  Follow up in 4 months

## 2019-02-23 ENCOUNTER — Ambulatory Visit: Payer: BC Managed Care – PPO | Admitting: Neurology

## 2019-04-01 ENCOUNTER — Other Ambulatory Visit: Payer: Self-pay | Admitting: Neurology

## 2019-06-21 NOTE — Progress Notes (Deleted)
NEUROLOGY FOLLOW UP OFFICE NOTE  BRILEE TINDEL UF:[id number]  HISTORY OF PRESENT ILLNESS: Amber [name] is a 58 year old right-handed black woman with CAD, type 2 diabetes mellitus, HTN, hyperlipidemia, tobacco use disorder and history of stroke and breast cancer (no chemotherapy) who follows up for diabetic polyneuropathy  UPDATE: In June, she was started on gabapentin, titrated to 300mg  at bedtime.   ***  HISTORY: For a couple of years, she experienced intermittent discomfort (tingling/burning) in feet and legs, particularly at night.  Last year, she was diagnosed with diabetes.  Since then, she has been having neuropathy (right foot worse than left foot).  It feels like pins and needles in the feet as well as burning.  Big toe on the right feels tender.  Notes some numbness.  No falls.  She feels that the symptoms are unchanged.    Her blood sugars have been good.  Typically morning readings are less than 130 and second reading 150.  Occasionally in the 170s.  Hgb A1c from 09/09/18 was 6.3.  She also has history of B12 deficiency/pernicious anemia for which she takes injections and over the counter pills.  Level from 09/09/18 was >1525.   She was told to stop the oral pills and symptoms worsened.  She has history of hypothyroidism well controlled.  TSH from 09/09/18 was 2.76.    PAST MEDICAL HISTORY: Past Medical History:  Diagnosis Date  . Allergic rhinitis   . Anginal pain ([place]) 2001  . Breast cancer ([place])    invasive ductal carcinoma right breast; ER, PR + and HER 2 -  . Chronic neck pain    hx of  . Diabetes mellitus without complication ([place])   . DJD (degenerative joint disease) of hip    bil hip  . GERD (gastroesophageal reflux disease)    hx of   . Heart attack [facility]) 2001   Dr. Scarlette Calico  . History of radiation therapy 09/16/12- 10/19/12   right breast  . Hyperlipidemia   . Hypertension    Dr. Vickey Huger family medicine  . Hypothyroidism   . Migraines    "last  one was 1990's" (08/17/2012)  . Neuromuscular disorder ([place])    carpal tunnel right hand  . Osteoporosis   . Pernicious anemia   . Sickle cell trait ([place])   . Sleep apnea    does use a cpap  . Stroke ([place])   . Tobacco abuse   . Wears glasses     MEDICATIONS: Current Outpatient Medications on File Prior to Visit  Medication Sig Dispense Refill  . alendronate (FOSAMAX) 70 MG tablet TAKE ONE TABLET BY MOUTH ONCE A WEEK ON  SATURDAY 4 tablet 0  . amoxicillin (AMOXIL) 500 MG capsule Take 500 mg by mouth as directed. Takes for dental cleaning or any type of procedures may have    . aspirin EC 81 MG tablet Take 1 tablet (81 mg total) by mouth daily. 90 tablet 3  . atenolol (TENORMIN) 25 MG tablet Take 1 tablet (25 mg total) by mouth daily. 90 tablet 3  . BIOTIN PO Take 2 tablets by mouth daily.    . cholecalciferol (VITAMIN D) 1000 units tablet Take 1,000 Units by mouth daily.     . Coenzyme Q10 (COQ10 PO) Take 1 tablet by mouth daily.    . cyanocobalamin (,VITAMIN B-12,) 1000 MCG/ML injection Inject 1,000 mcg into the muscle every 30 (thirty) days. Reported on 02/11/2016    . ezetimibe (ZETIA) 10  MG tablet Take 1 tablet (10 mg total) by mouth daily. 90 tablet 3  . gabapentin (NEURONTIN) 100 MG capsule TAKE 3 CAPSULES AT BEDTIME 90 capsule 3  . IRON PO Take 1 tablet by mouth at bedtime as needed. When feeling tired    . levothyroxine (SYNTHROID) 88 MCG tablet Take 88 mcg by mouth daily before breakfast.    . lisinopril (ZESTRIL) 5 MG tablet Take 1 tablet (5 mg total) by mouth daily. 90 tablet 3  . metFORMIN (GLUCOPHAGE) 500 MG tablet daily.    . mometasone (NASONEX) 50 MCG/ACT nasal spray Place 2 sprays into the nose daily as needed. For allergies    . nitroGLYCERIN (NITROSTAT) 0.4 MG SL tablet Place 0.4 mg under the tongue every 5 (five) minutes as needed for chest pain. Reported on 02/11/2016    . Omega-3 Fatty Acids (FISH OIL PO) Take 1 capsule by mouth daily.    . rosuvastatin (CRESTOR)  20 MG tablet Take 1 tablet (20 mg total) by mouth every other day. 45 tablet 3  . triamcinolone (KENALOG) 0.1 % paste Use as directed 1 application in the mouth or throat as needed (FOR GUMS).     . valACYclovir (VALTREX) 500 MG tablet Take 500 mg by mouth daily as needed (Cold sore). Reported on 02/11/2016    . zolpidem (AMBIEN) 10 MG tablet Take 10 mg by mouth at bedtime as needed for sleep. Reported on 02/11/2016     No current facility-administered medications on file prior to visit.     ALLERGIES: Allergies  Allergen Reactions  . Tramadol Nausea And Vomiting    "couldn't stop vomiting" (08/17/2012)  . Benzoyl Peroxide Itching and Rash  . Betadine [Povidone Iodine] Rash    FAMILY HISTORY: Family History  Problem Relation Age of Onset  . Diabetes Mother   . Heart disease Mother   . Cancer Maternal Grandmother        pancreatic  . Cancer Cousin        pancreatic  . Parkinson's disease Maternal Grandfather   .  SOCIAL HISTORY: Social History   Socioeconomic History  . Marital status: Single    Spouse name: Not on file  . Number of children: 0  . Years of education: Not on file  . Highest education level: Not on file  Occupational History  . Not on file  Social Needs  . Financial resource strain: Not on file  . Food insecurity    Worry: Not on file    Inability: Not on file  . Transportation needs    Medical: Not on file    Non-medical: Not on file  Tobacco Use  . Smoking status: Current Some Day Smoker    Packs/day: 1.00    Years: 35.00    Pack years: 35.00    Types: Cigarettes  . Smokeless tobacco: Never Used  Substance and Sexual Activity  . Alcohol use: Yes    Comment: 08/17/2012 "couple mixed drinks; couple times/month; sometimes it's beer"  . Drug use: No  . Sexual activity: Not Currently  Lifestyle  . Physical activity    Days per week: Not on file    Minutes per session: Not on file  . Stress: Not on file  Relationships  . Social Product manager on phone: Not on file    Gets together: Not on file    Attends religious service: Not on file    Active member of club or organization: Not on file  Attends meetings of clubs or organizations: Not on file    Relationship status: Not on file  . Intimate partner violence    Fear of current or ex partner: Not on file    Emotionally abused: Not on file    Physically abused: Not on file    Forced sexual activity: Not on file  Other Topics Concern  . Not on file  Social History Narrative   Right handed   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 story home with Stage manager    REVIEW OF SYSTEMS: Constitutional: No fevers, chills, or sweats, no generalized fatigue, change in appetite Eyes: No visual changes, double vision, eye pain Ear, nose and throat: No hearing loss, ear pain, nasal congestion, sore throat Cardiovascular: No chest pain, palpitations Respiratory:  No shortness of breath at rest or with exertion, wheezes GastrointestinaI: No nausea, vomiting, diarrhea, abdominal pain, fecal incontinence Genitourinary:  No dysuria, urinary retention or frequency Musculoskeletal:  No neck pain, back pain Integumentary: No rash, pruritus, skin lesions Neurological: as above Psychiatric: No depression, insomnia, anxiety Endocrine: No palpitations, fatigue, diaphoresis, mood swings, change in appetite, change in weight, increased thirst Hematologic/Lymphatic:  No purpura, petechiae. Allergic/Immunologic: no itchy/runny eyes, nasal congestion, recent allergic reactions, rashes  PHYSICAL EXAM: *** General: No acute distress.  Patient appears well-groomed.   Head:  Normocephalic/atraumatic Eyes:  Fundi examined but not visualized Neck: supple, no paraspinal tenderness, full range of motion Heart:  Regular rate and rhythm Lungs:  Clear to auscultation bilaterally Back: No paraspinal tenderness Neurological Exam: alert and oriented to person, place, and time. Attention span and  concentration intact, recent and remote memory intact, fund of knowledge intact.  Speech fluent and not dysarthric, language intact.  CN II-XII intact. Bulk and tone normal, muscle strength 5/5 throughout.  Sensation to pinprick reduced in the feet up to the ankles.  Vibration sensation intact.  Deep tendon reflexes 2+ throughout except absent in the ankles, toes downgoing.  Finger to nose and heel to shin testing intact.  Gait normal, Romberg negative.  IMPRESSION: Diabetic polyneuropathy  PLAN: ***  Metta Clines, DO  CC: [name], MD

## 2019-06-24 ENCOUNTER — Ambulatory Visit: Payer: BC Managed Care – PPO | Admitting: Neurology

## 2019-06-29 ENCOUNTER — Ambulatory Visit: Payer: BC Managed Care – PPO | Admitting: Neurology

## 2019-06-29 ENCOUNTER — Other Ambulatory Visit: Payer: Self-pay

## 2019-06-29 ENCOUNTER — Encounter: Payer: Self-pay | Admitting: Neurology

## 2019-06-29 VITALS — BP 144/79 | HR 52 | Ht 65.0 in | Wt 145.6 lb

## 2019-06-29 DIAGNOSIS — E1142 Type 2 diabetes mellitus with diabetic polyneuropathy: Secondary | ICD-10-CM | POA: Diagnosis not present

## 2019-06-29 MED ORDER — GABAPENTIN 300 MG PO CAPS: 300.0000 mg | ORAL_CAPSULE | Freq: Every day | ORAL | 5 refills | Status: DC

## 2019-06-29 NOTE — Progress Notes (Signed)
NEUROLOGY FOLLOW UP OFFICE NOTE  Amber [name] [id number]  HISTORY OF PRESENT ILLNESS: Amber [name] is a 58 year old right-handed black woman with CAD, type 2 diabetes mellitus, HTN, hyperlipidemia, tobacco use disorder and history of stroke and breast cancer (no chemotherapy) who follows up for diabetic polyneuropathy  UPDATE: In June, she was started on gabapentin, titrated to 300mg  at bedtime.   She is doing well.  Pain is controlled.  However, she reports that the big toe on her right foot is sensitive.  Sometimes she reports hyperesthesia to touch.  HISTORY: For a couple of years, she experienced intermittent discomfort (tingling/burning) in feet and legs, particularly at night.  Last year, she was diagnosed with diabetes.  Since then, she has been having neuropathy (right foot worse than left foot).  It feels like pins and needles in the feet as well as burning.  Big toe on the right feels tender.  Notes some numbness.  No falls.  She feels that the symptoms are unchanged.    Her blood sugars have been good.  Typically morning readings are less than 130 and second reading 150.  Occasionally in the 170s.  Hgb A1c from 09/09/18 was 6.3.  She also has history of B12 deficiency/pernicious anemia for which she takes injections and over the counter pills.  Level from 09/09/18 was >1525.   She was told to stop the oral pills and symptoms worsened.  She has history of hypothyroidism well controlled.  TSH from 09/09/18 was 2.76.    PAST MEDICAL HISTORY: Past Medical History:  Diagnosis Date  . Allergic rhinitis   . Anginal pain ([place]) 2001  . Breast cancer ([place])    invasive ductal carcinoma right breast; ER, PR + and HER 2 -  . Chronic neck pain    hx of  . Diabetes mellitus without complication ([place])   . DJD (degenerative joint disease) of hip    bil hip  . GERD (gastroesophageal reflux disease)    hx of   . Heart attack [facility]) 2001   Dr. Scarlette Calico  . History of radiation therapy  09/16/12- 10/19/12   right breast  . Hyperlipidemia   . Hypertension    Dr. [facility]  . Hypothyroidism   . Migraines    "last one was 1990's" (08/17/2012)  . Neuromuscular disorder ([place])    carpal tunnel right hand  . Osteoporosis   . Pernicious anemia   . Sickle cell trait ([place])   . Sleep apnea    does use a cpap  . Stroke ([place])   . Tobacco abuse   . Wears glasses     MEDICATIONS: Current Outpatient Medications on File Prior to Visit  Medication Sig Dispense Refill  . alendronate (FOSAMAX) 70 MG tablet TAKE ONE TABLET BY MOUTH ONCE A WEEK ON  SATURDAY 4 tablet 0  . amoxicillin (AMOXIL) 500 MG capsule Take 500 mg by mouth as directed. Takes for dental cleaning or any type of procedures may have    . aspirin EC 81 MG tablet Take 1 tablet (81 mg total) by mouth daily. 90 tablet 3  . atenolol (TENORMIN) 25 MG tablet Take 1 tablet (25 mg total) by mouth daily. 90 tablet 3  . BIOTIN PO Take 2 tablets by mouth daily.    . cholecalciferol (VITAMIN D) 1000 units tablet Take 1,000 Units by mouth daily.     . Coenzyme Q10 (COQ10 PO) Take 1 tablet by mouth daily.    Marland Kitchen  cyanocobalamin (,VITAMIN B-12,) 1000 MCG/ML injection Inject 1,000 mcg into the muscle every 30 (thirty) days. Reported on 02/11/2016    . ezetimibe (ZETIA) 10 MG tablet Take 1 tablet (10 mg total) by mouth daily. 90 tablet 3  . gabapentin (NEURONTIN) 100 MG capsule TAKE 3 CAPSULES AT BEDTIME 90 capsule 3  . IRON PO Take 1 tablet by mouth at bedtime as needed. When feeling tired    . levothyroxine (SYNTHROID) 88 MCG tablet Take 88 mcg by mouth daily before breakfast.    . lisinopril (ZESTRIL) 5 MG tablet Take 1 tablet (5 mg total) by mouth daily. 90 tablet 3  . metFORMIN (GLUCOPHAGE) 500 MG tablet daily.    . mometasone (NASONEX) 50 MCG/ACT nasal spray Place 2 sprays into the nose daily as needed. For allergies    . nitroGLYCERIN (NITROSTAT) 0.4 MG SL tablet Place 0.4 mg under the tongue every 5 (five)  minutes as needed for chest pain. Reported on 02/11/2016    . Omega-3 Fatty Acids (FISH OIL PO) Take 1 capsule by mouth daily.    . rosuvastatin (CRESTOR) 20 MG tablet Take 1 tablet (20 mg total) by mouth every other day. 45 tablet 3  . triamcinolone (KENALOG) 0.1 % paste Use as directed 1 application in the mouth or throat as needed (FOR GUMS).     . valACYclovir (VALTREX) 500 MG tablet Take 500 mg by mouth daily as needed (Cold sore). Reported on 02/11/2016    . zolpidem (AMBIEN) 10 MG tablet Take 10 mg by mouth at bedtime as needed for sleep. Reported on 02/11/2016     No current facility-administered medications on file prior to visit.     ALLERGIES: Allergies  Allergen Reactions  . Tramadol Nausea And Vomiting    "couldn't stop vomiting" (08/17/2012)  . Benzoyl Peroxide Itching and Rash  . Betadine [Povidone Iodine] Rash    FAMILY HISTORY: Family History  Problem Relation Age of Onset  . Diabetes Mother   . Heart disease Mother   . Cancer Maternal Grandmother        pancreatic  . Cancer Cousin        pancreatic  . Parkinson's disease Maternal Grandfather   .  SOCIAL HISTORY: Social History   Socioeconomic History  . Marital status: Single    Spouse name: Not on file  . Number of children: 0  . Years of education: Not on file  . Highest education level: Not on file  Occupational History  . Not on file  Social Needs  . Financial resource strain: Not on file  . Food insecurity    Worry: Not on file    Inability: Not on file  . Transportation needs    Medical: Not on file    Non-medical: Not on file  Tobacco Use  . Smoking status: Current Some Day Smoker    Packs/day: 1.00    Years: 35.00    Pack years: 35.00    Types: Cigarettes  . Smokeless tobacco: Never Used  Substance and Sexual Activity  . Alcohol use: Yes    Comment: 08/17/2012 "couple mixed drinks; couple times/month; sometimes it's beer"  . Drug use: No  . Sexual activity: Not Currently  Lifestyle   . Physical activity    Days per week: Not on file    Minutes per session: Not on file  . Stress: Not on file  Relationships  . Social Herbalist on phone: Not on file    Gets  together: Not on file    Attends religious service: Not on file    Active member of club or organization: Not on file    Attends meetings of clubs or organizations: Not on file    Relationship status: Not on file  . Intimate partner violence    Fear of current or ex partner: Not on file    Emotionally abused: Not on file    Physically abused: Not on file    Forced sexual activity: Not on file  Other Topics Concern  . Not on file  Social History Narrative   Right handed   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 story home with Stage manager    REVIEW OF SYSTEMS: Constitutional: No fevers, chills, or sweats, no generalized fatigue, change in appetite Eyes: No visual changes, double vision, eye pain Ear, nose and throat: No hearing loss, ear pain, nasal congestion, sore throat Cardiovascular: No chest pain, palpitations Respiratory:  No shortness of breath at rest or with exertion, wheezes GastrointestinaI: No nausea, vomiting, diarrhea, abdominal pain, fecal incontinence Genitourinary:  No dysuria, urinary retention or frequency Musculoskeletal:  No neck pain, back pain Integumentary: No rash, pruritus, skin lesions Neurological: as above Psychiatric: No depression, insomnia, anxiety Endocrine: No palpitations, fatigue, diaphoresis, mood swings, change in appetite, change in weight, increased thirst Hematologic/Lymphatic:  No purpura, petechiae. Allergic/Immunologic: no itchy/runny eyes, nasal congestion, recent allergic reactions, rashes  PHYSICAL EXAM: Blood pressure (!) 144/79, pulse (!) 52, height 5\' 5"  (1.651 m), weight 145 lb 9.6 oz (66 kg), SpO2 99 %.  General: No acute distress.  Patient appears well-groomed.   Head:  Normocephalic/atraumatic Eyes:  Fundi examined but not visualized  Neck: supple, no paraspinal tenderness, full range of motion Heart:  Regular rate and rhythm Lungs:  Clear to auscultation bilaterally Back: No paraspinal tenderness Neurological Exam: alert and oriented to person, place, and time. Attention span and concentration intact, recent and remote memory intact, fund of knowledge intact.  Speech fluent and not dysarthric, language intact.  CN II-XII intact. Bulk and tone normal, muscle strength 5/5 throughout.  Sensation to pinprick reduced in the feet up to the ankles but with hyperesthesia on the first digit of right foot.  Vibration sensation intact.  Deep tendon reflexes 2+ throughout except absent in the ankles, toes downgoing.  Finger to nose and heel to shin testing intact.  Gait normal, Romberg negative.  IMPRESSION: Diabetic polyneuropathy  PLAN: 1.  Refilled gabapentin 300mg  at bedtime 2.  Follow up in 6 months.  15 minutes spent face to face with patient, over 50% spent discussing management.   Metta Clines, DO  CC: [name], MD

## 2019-06-29 NOTE — Patient Instructions (Signed)
Continue gabapentin 300mg  at bedtime Follow up in 3 months.

## 2019-08-08 ENCOUNTER — Other Ambulatory Visit: Payer: Self-pay | Admitting: Hematology and Oncology

## 2019-08-08 DIAGNOSIS — Z1231 Encounter for screening mammogram for malignant neoplasm of breast: Secondary | ICD-10-CM

## 2019-09-28 ENCOUNTER — Ambulatory Visit
Admission: RE | Admit: 2019-09-28 | Discharge: 2019-09-28 | Disposition: A | Discharge status: Home / Self Care | Payer: BC Managed Care – PPO | Source: Ambulatory Visit | Attending: Hematology and Oncology | Admitting: Hematology and Oncology

## 2019-09-28 ENCOUNTER — Other Ambulatory Visit: Payer: Self-pay

## 2019-09-28 DIAGNOSIS — Z1231 Encounter for screening mammogram for malignant neoplasm of breast: Secondary | ICD-10-CM

## 2019-11-03 ENCOUNTER — Ambulatory Visit: Payer: BC Managed Care – PPO | Admitting: Neurology

## 2019-12-28 NOTE — Progress Notes (Deleted)
Cardiology Office Note   Date:  12/28/2019   ID:  Amber [name], DOB September 11, 1960, MRN [id number]  PCP:  [name], MD    No chief complaint on file.  CAD  Wt Readings from Last 3 Encounters:  06/29/19 145 lb 9.6 oz (66 kg)  02/22/19 152 lb (68.9 kg)  12/29/18 145 lb (65.8 kg)       History of Present Illness: Amber [name] is a 59 y.o. female    Who had an anterior MI in 2001. She had a stent placed by Dr. Leonia Reeves.   She had hip replacement in 12/16.   No heart issues at the time of the surgery.   At the time of her heart attackin 2001, she had nausea, diaphoresis and chest pain. SHe knew it was a heart attack.    She has stopped smoking in Feb 2018. She has been bradycardic in the past.    Past Medical History:  Diagnosis Date  . Allergic rhinitis   . Anginal pain ([place]) 2001  . Breast cancer ([place])    invasive ductal carcinoma right breast; ER, PR + and HER 2 -  . Chronic neck pain    hx of  . Diabetes mellitus without complication ([place])   . DJD (degenerative joint disease) of hip    bil hip  . GERD (gastroesophageal reflux disease)    hx of   . Heart attack [facility]) 2001   Dr. Scarlette Calico  . History of radiation therapy 09/16/12- 10/19/12   right breast  . Hyperlipidemia   . Hypertension    Dr. Vic[facility]  . Hypothyroidism   . Migraines    "last one was 1990's" (08/17/2012)  . Neuromuscular disorder ([place])    carpal tunnel right hand  . Osteoporosis   . Pernicious anemia   . Sickle cell trait ([place])   . Sleep apnea    does use a cpap  . Stroke ([place])   . Tobacco abuse   . Wears glasses     Past Surgical History:  Procedure Laterality Date  . BREAST BIOPSY  06/2012   "right" (08/17/2012)  . COLONOSCOPY    . CORONARY ANGIOPLASTY WITH STENT PLACEMENT  2001   "1" (08/17/2012)  . DORSAL COMPARTMENT RELEASE Left 08/18/2014   Procedure: LEFT FIRST RELEASE DORSAL COMPARTMENT ([place]);  Surgeon: Hessie Dibble, MD;  Location: [place];  Service: Orthopedics;  Laterality: Left;  . NASAL SINUS SURGERY  1990  . PARTIAL MASTECTOMY WITH NEEDLE LOCALIZATION AND AXILLARY SENTINEL LYMPH NODE BX  07/15/2012   Procedure: PARTIAL MASTECTOMY WITH NEEDLE LOCALIZATION AND AXILLARY SENTINEL LYMPH NODE BX;  Surgeon: Adin Hector, MD;  Location: [facility];  Service: General;  Laterality: Right;  . TOTAL HIP ARTHROPLASTY  08/17/2012   "right" (08/17/2012)  . TOTAL HIP ARTHROPLASTY  08/17/2012   Procedure: TOTAL HIP ARTHROPLASTY ANTERIOR APPROACH;  Surgeon: Hessie Dibble, MD;  Location: [place];  Service: Orthopedics;  Laterality: Right;  . TOTAL HIP ARTHROPLASTY Left 08/21/2015  . TOTAL HIP ARTHROPLASTY Left 08/21/2015   Procedure: TOTAL HIP ARTHROPLASTY ANTERIOR APPROACH AND RIGHT TRIGGER THUMB RELEASE;  Surgeon: Melrose Nakayama, MD;  Location: [place];  Service: Orthopedics;  Laterality: Left;     Current Outpatient Medications  Medication Sig Dispense Refill  . alendronate (FOSAMAX) 70 MG tablet TAKE ONE TABLET BY MOUTH ONCE A WEEK ON  SATURDAY 4 tablet 0  . amoxicillin (AMOXIL) 500 MG capsule Take  500 mg by mouth as directed. Takes for dental cleaning or any type of procedures may have    . aspirin EC 81 MG tablet Take 1 tablet (81 mg total) by mouth daily. 90 tablet 3  . atenolol (TENORMIN) 25 MG tablet Take 1 tablet (25 mg total) by mouth daily. 90 tablet 3  . BIOTIN PO Take 2 tablets by mouth daily.    . cholecalciferol (VITAMIN D) 1000 units tablet Take 1,000 Units by mouth daily.     . Coenzyme Q10 (COQ10 PO) Take 1 tablet by mouth daily.    . cyanocobalamin (,VITAMIN B-12,) 1000 MCG/ML injection Inject 1,000 mcg into the muscle every 30 (thirty) days. Reported on 02/11/2016    . ezetimibe (ZETIA) 10 MG tablet Take 1 tablet (10 mg total) by mouth daily. 90 tablet 3  . gabapentin (NEURONTIN) 300 MG capsule Take 1 capsule (300 mg total) by mouth at bedtime. 30 capsule 5  . IRON PO  Take 1 tablet by mouth at bedtime as needed. When feeling tired    . levothyroxine (SYNTHROID) 88 MCG tablet Take 88 mcg by mouth daily before breakfast.    . lisinopril (ZESTRIL) 5 MG tablet Take 1 tablet (5 mg total) by mouth daily. 90 tablet 3  . metFORMIN (GLUCOPHAGE) 500 MG tablet daily.    . mometasone (NASONEX) 50 MCG/ACT nasal spray Place 2 sprays into the nose daily as needed. For allergies    . nitroGLYCERIN (NITROSTAT) 0.4 MG SL tablet Place 0.4 mg under the tongue every 5 (five) minutes as needed for chest pain. Reported on 02/11/2016    . Omega-3 Fatty Acids (FISH OIL PO) Take 1 capsule by mouth daily.    . rosuvastatin (CRESTOR) 20 MG tablet Take 1 tablet (20 mg total) by mouth every other day. 45 tablet 3  . triamcinolone (KENALOG) 0.1 % paste Use as directed 1 application in the mouth or throat as needed (FOR GUMS).     . valACYclovir (VALTREX) 500 MG tablet Take 500 mg by mouth daily as needed (Cold sore). Reported on 02/11/2016    . zolpidem (AMBIEN) 10 MG tablet Take 10 mg by mouth at bedtime as needed for sleep. Reported on 02/11/2016     No current facility-administered medications for this visit.    Allergies:   Tramadol, Benzoyl peroxide, and Betadine [povidone iodine]    Social History:  The patient  reports that she has been smoking cigarettes. She has a 35.00 pack-year smoking history. She has never used smokeless tobacco. She reports current alcohol use. She reports that she does not use drugs.   Family History:  The patient's ***family history includes Cancer in her cousin and maternal grandmother; Diabetes in her mother; Heart disease in her mother; Parkinson's disease in her maternal grandfather.    ROS:  Please see the history of present illness.   Otherwise, review of systems are positive for ***.   All other systems are reviewed and negative.    PHYSICAL EXAM: VS:  There were no vitals taken for this visit. , BMI There is no height or weight on file to  calculate BMI. GEN: Well nourished, well developed, in no acute distress  HEENT: normal  Neck: no JVD, carotid bruits, or masses Cardiac: ***RRR; no murmurs, rubs, or gallops,no edema  Respiratory:  clear to auscultation bilaterally, normal work of breathing GI: soft, nontender, nondistended, + BS MS: no deformity or atrophy  Skin: warm and dry, no rash Neuro:  Strength and sensation  are intact Psych: euthymic mood, full affect   EKG:   The ekg ordered today demonstrates ***   Recent Labs: No results found for requested labs within last 8760 hours.   Lipid Panel    Component Value Date/Time   CHOL 115 01/04/2018 0754   TRIG 68 01/04/2018 0754   HDL 47 01/04/2018 0754   CHOLHDL 2.4 01/04/2018 0754   CHOLHDL 2 11/02/2014 0806   VLDL 16.4 11/02/2014 0806   LDLCALC 54 01/04/2018 0754     Other studies Reviewed: Additional studies/ records that were reviewed today with results demonstrating: ***.   ASSESSMENT AND PLAN:  1. CAD/old MI: Continue aggressive secondary prevention. 2. Hyperlipidemia: Continue Crestor.  Whole food, plant-based diet recommended.  This would also help her previously borderline sugar. 3. Hypertension: 4. History of tobacco abuse: She was able to quit back in 2018.  It appears she was back smoking in 2020.  I encouraged her to continue to abstain.   Current medicines are reviewed at length with the patient today.  The patient concerns regarding her medicines were addressed.  The following changes have been made:  No change***  Labs/ tests ordered today include: *** No orders of the defined types were placed in this encounter.   Recommend 150 minutes/week of aerobic exercise Low fat, low carb, high fiber diet recommended  Disposition:   FU in ***   Signed, Larae Grooms, MD  12/28/2019 8:33 AM    [facility] [address], [place],   [address] Phone: [phone]; Fax: [phone]

## 2019-12-29 ENCOUNTER — Ambulatory Visit: Payer: BC Managed Care – PPO | Admitting: Interventional Cardiology

## 2020-01-02 NOTE — Progress Notes (Signed)
   Due to the COVID-19 crisis, this virtual check-in visit was done via telephone from my office and it was initiated and consent given by this patient and or family.  Due to poor weather conditions, we were unable to connect via video visit.  Telephone (Audio) Visit The purpose of this telephone visit is to provide medical care while limiting exposure to the novel coronavirus.    Consent was obtained for telephone visit and initiated by pt/family:  Yes.   Answered questions that patient had about telehealth interaction:  Yes.   I discussed the limitations, risks, security and privacy concerns of performing an evaluation and management service by telephone. I also discussed with the patient that there may be a patient responsible charge related to this service. The patient expressed understanding and agreed to proceed.  Pt location: Home Physician Location: office Name of referring provider:  [name], MD I connected with .Amber [name] at patients initiation/request on 01/03/2020 at  3:30 PM EDT by telephone and verified that I am speaking with the correct person using two identifiers.  Pt MRN:  [id number] Pt DOB:  20-Jul-1961  Assessment/Plan:   Diabetic polyneuropathy  1.  Gabapentin 300mg  at bedtime (refilled) 2.  Follow up in one year or as needed.  Need for in person visit now:  No.  Subjective:  Amber [name] is a 59 year old right-handed black woman with CAD, type 2 diabetes mellitus, HTN, hyperlipidemia, tobacco use disorder and history of stroke and breast cancer(no chemotherapy)who follows up for diabetic polyneuropathy  UPDATE: Current medication:  Gabapentin 300mg  at bedtime She is doing well.  Pain is controlled.  HISTORY: For a couple of years, she experienced intermittent discomfort (tingling/burning) in feet and legs, particularly at night. Last year, she was diagnosed with diabetes. Since then, she has been having neuropathy (right foot worse than left  foot). It feels like pins and needles in the feet as well as burning. Big toe on the right feels tender. Notes some numbness. No falls. She feels that the symptoms are unchanged.   Her blood sugars have been good. Typically morning readings are less than 130 and second reading 150. Occasionally in the 170s. Hgb A1c from 09/09/18 was 6.3. She also has history of B12 deficiency/pernicious anemiafor which she takes injections and over the counter pills. Level from 09/09/18 was >1525. She was told to stop the oral pills and symptoms worsened.She has history of hypothyroidism well controlled. TSH from 09/09/18 was 2.76.    Objective:   There were no vitals filed for this visit.   Follow Up Instructions:      -I discussed the assessment and treatment plan with the patient. The patient was provided an opportunity to ask questions and all were answered. The patient agreed with the plan and demonstrated an understanding of the instructions.   The patient was advised to call back or seek an in-person evaluation if the symptoms worsen or if the condition fails to improve as anticipated.    Total Time spent in visit with the patient was:  6 minutes  Dudley Major, DO

## 2020-01-03 ENCOUNTER — Other Ambulatory Visit: Payer: Self-pay

## 2020-01-03 ENCOUNTER — Encounter: Payer: Self-pay | Admitting: Neurology

## 2020-01-03 ENCOUNTER — Telehealth (INDEPENDENT_AMBULATORY_CARE_PROVIDER_SITE_OTHER): Payer: BC Managed Care – PPO | Admitting: Neurology

## 2020-01-03 DIAGNOSIS — E1142 Type 2 diabetes mellitus with diabetic polyneuropathy: Secondary | ICD-10-CM | POA: Diagnosis not present

## 2020-01-03 MED ORDER — GABAPENTIN 300 MG PO CAPS: 300.0000 mg | ORAL_CAPSULE | Freq: Every day | ORAL | 3 refills | Status: DC

## 2020-01-13 NOTE — Progress Notes (Signed)
Cardiology Office Note   Date:  01/16/2020   ID:  LATASCHA [name], DOB 1961-06-26, MRN [id number]  PCP:  [name], MD    No chief complaint on file.  CAD  Wt Readings from Last 3 Encounters:  01/16/20 147 lb (66.7 kg)  06/29/19 145 lb 9.6 oz (66 kg)  02/22/19 152 lb (68.9 kg)       History of Present Illness: Amber [name] is a 59 y.o. female  Who had an anterior MI in 2001. She had a stent placed by Dr. Leonia Reeves. Prior to the MI, her Mom had an MI which prompted a stress test in the patient which was normal.   She had hip replacement in 12/16.   No heart issues at the time of the surgery.   At the time of her heart attackin 2001, she had nausea, diaphoresis and chest pain. SHe knew it was a heart attack.  No heart cath since then.  She has stopped smoking in Feb 2018. She has been bradycardic in the past.  Denies : Chest pain. Dizziness. Leg edema.  Orthopnea. Palpitations. Paroxysmal nocturnal dyspnea. Shortness of breath. Syncope.   She used the NTG twice for "feeling funny."  It was thought it might be related to low blood sugar.  It did not feel like angina.  No further episodes recently.  She does walk regularly, but stopped going to the gym with [employer].     Past Medical History:  Diagnosis Date  . Allergic rhinitis   . Anginal pain ([place]) 2001  . Breast cancer ([place])    invasive ductal carcinoma right breast; ER, PR + and HER 2 -  . Chronic neck pain    hx of  . Diabetes mellitus without complication ([place])   . DJD (degenerative joint disease) of hip    bil hip  . GERD (gastroesophageal reflux disease)    hx of   . Heart attack [facility]) 2001   Dr. Scarlette Calico  . History of radiation therapy 09/16/12- 10/19/12   right breast  . Hyperlipidemia   . Hypertension    Dr. [facility]  . Hypothyroidism   . Migraines    "last one was 1990's" (08/17/2012)  . Neuromuscular disorder ([place])    carpal tunnel right hand  .  Osteoporosis   . Pernicious anemia   . Sickle cell trait ([place])   . Sleep apnea    does use a cpap  . Stroke ([place])   . Tobacco abuse   . Wears glasses     Past Surgical History:  Procedure Laterality Date  . BREAST BIOPSY  06/2012   "right" (08/17/2012)  . COLONOSCOPY    . CORONARY ANGIOPLASTY WITH STENT PLACEMENT  2001   "1" (08/17/2012)  . DORSAL COMPARTMENT RELEASE Left 08/18/2014   Procedure: LEFT FIRST RELEASE DORSAL COMPARTMENT ([place]);  Surgeon: Hessie Dibble, MD;  Location: [facility];  Service: Orthopedics;  Laterality: Left;  . NASAL SINUS SURGERY  1990  . PARTIAL MASTECTOMY WITH NEEDLE LOCALIZATION AND AXILLARY SENTINEL LYMPH NODE BX  07/15/2012   Procedure: PARTIAL MASTECTOMY WITH NEEDLE LOCALIZATION AND AXILLARY SENTINEL LYMPH NODE BX;  Surgeon: Adin Hector, MD;  Location: [facility];  Service: General;  Laterality: Right;  . TOTAL HIP ARTHROPLASTY  08/17/2012   "right" (08/17/2012)  . TOTAL HIP ARTHROPLASTY  08/17/2012   Procedure: TOTAL HIP ARTHROPLASTY ANTERIOR APPROACH;  Surgeon: Hessie Dibble, MD;  Location: [facility];  Service: Orthopedics;  Laterality: Right;  . TOTAL HIP ARTHROPLASTY Left 08/21/2015  . TOTAL HIP ARTHROPLASTY Left 08/21/2015   Procedure: TOTAL HIP ARTHROPLASTY ANTERIOR APPROACH AND RIGHT TRIGGER THUMB RELEASE;  Surgeon: Melrose Nakayama, MD;  Location: [facility];  Service: Orthopedics;  Laterality: Left;     Current Outpatient Medications  Medication Sig Dispense Refill  . amoxicillin (AMOXIL) 500 MG capsule Take 500 mg by mouth as directed. Takes for dental cleaning or any type of procedures may have    . aspirin EC 81 MG tablet Take 1 tablet (81 mg total) by mouth daily. 90 tablet 3  . atenolol (TENORMIN) 25 MG tablet Take 1 tablet (25 mg total) by mouth daily. 90 tablet 3  . BIOTIN PO Take 2 tablets by mouth daily.    . cholecalciferol (VITAMIN D) 1000 units tablet Take 1,000 Units by mouth daily.     . Coenzyme Q10 (COQ10  PO) Take 1 tablet by mouth daily.    . cyanocobalamin (,VITAMIN B-12,) 1000 MCG/ML injection Inject 1,000 mcg into the muscle every 30 (thirty) days. Reported on 02/11/2016    . ezetimibe (ZETIA) 10 MG tablet Take 1 tablet (10 mg total) by mouth daily. 90 tablet 3  . gabapentin (NEURONTIN) 300 MG capsule Take 1 capsule (300 mg total) by mouth at bedtime. 90 capsule 3  . IRON PO Take 1 tablet by mouth at bedtime as needed. When feeling tired    . levothyroxine (SYNTHROID) 88 MCG tablet Take 88 mcg by mouth daily before breakfast.    . lisinopril (ZESTRIL) 5 MG tablet Take 1 tablet (5 mg total) by mouth daily. 90 tablet 3  . metFORMIN (GLUCOPHAGE) 500 MG tablet Take 500 mg by mouth 2 (two) times daily with a meal.     . mometasone (NASONEX) 50 MCG/ACT nasal spray Place 2 sprays into the nose daily as needed. For allergies    . nitroGLYCERIN (NITROSTAT) 0.4 MG SL tablet Place 0.4 mg under the tongue every 5 (five) minutes as needed for chest pain. Reported on 02/11/2016    . Omega-3 Fatty Acids (FISH OIL PO) Take 1 capsule by mouth daily.    . rosuvastatin (CRESTOR) 20 MG tablet Take 1 tablet (20 mg total) by mouth every other day. 45 tablet 3  . triamcinolone (KENALOG) 0.1 % paste Use as directed 1 application in the mouth or throat as needed (FOR GUMS).     . valACYclovir (VALTREX) 500 MG tablet Take 500 mg by mouth daily as needed (Cold sore). Reported on 02/11/2016    . zolpidem (AMBIEN) 10 MG tablet Take 10 mg by mouth at bedtime as needed for sleep. Reported on 02/11/2016     No current facility-administered medications for this visit.    Allergies:   Tramadol, Benzoyl peroxide, and Betadine [povidone iodine]    Social History:  The patient  reports that she has been smoking cigarettes. She has a 35.00 pack-year smoking history. She has never used smokeless tobacco. She reports current alcohol use. She reports that she does not use drugs.   Family History:  The patient's family history  includes Cancer in her cousin and maternal grandmother; Diabetes in her mother; Heart disease in her mother; Parkinson's disease in her maternal grandfather.    ROS:  Please see the history of present illness.   Otherwise, review of systems are positive for occasional low blood sugar.   All other systems are reviewed and negative.    PHYSICAL EXAM: VS:  BP  120/68   Pulse (!) 51   Ht 5\' 5"  (1.651 m)   Wt 147 lb (66.7 kg)   SpO2 99%   BMI 24.46 kg/m  , BMI Body mass index is 24.46 kg/m. GEN: Well nourished, well developed, in no acute distress  HEENT: normal  Neck: no JVD, carotid bruits, or masses Cardiac: RRR; no murmurs, rubs, or gallops,no edema  Respiratory:  clear to auscultation bilaterally, normal work of breathing GI: soft, nontender, nondistended, + BS MS: no deformity or atrophy  Skin: warm and dry, no rash Neuro:  Strength and sensation are intact Psych: euthymic mood, full affect   EKG:   The ekg ordered today demonstrates NSR, nonspecific ST changes   Recent Labs: No results found for requested labs within last 8760 hours.   Lipid Panel    Component Value Date/Time   CHOL 115 01/04/2018 0754   TRIG 68 01/04/2018 0754   HDL 47 01/04/2018 0754   CHOLHDL 2.4 01/04/2018 0754   CHOLHDL 2 11/02/2014 0806   VLDL 16.4 11/02/2014 0806   LDLCALC 54 01/04/2018 0754     Other studies Reviewed: Additional studies/ records that were reviewed today with results demonstrating: TC 148 in Jan 2021.  PMD labs reviewed.   ASSESSMENT AND PLAN:  1. CAD/Old MI: Continue aggressive secondary prevention. No angina. We discussed stress testing due to the "funny feelings" but she would like to hold off. Sx have not recurred.  She would consider doing this in the future, or sooner if sx recur.  If she had her typical angina, would plan for cath.  2. Hyperlipidemia: Whole food, plant-based diet recommended.  TC controlled.   3. HTN: Low-salt diet recommended. 4. Tobacco abuse:  She needs to abstain from smoking. 5. Prediabetes: A1C 6.0 on metformin   Current medicines are reviewed at length with the patient today.  The patient concerns regarding her medicines were addressed.  The following changes have been made:  No change  Labs/ tests ordered today include:  No orders of the defined types were placed in this encounter.   Recommend 150 minutes/week of aerobic exercise Low fat, low carb, high fiber diet recommended  Disposition:   FU in 1 year   Signed, Larae Grooms, MD  01/16/2020 8:50 AM    [facility] [address], [place], [place]  [address] Phone: [phone]; Fax: [phone]

## 2020-01-16 ENCOUNTER — Ambulatory Visit: Payer: BC Managed Care – PPO | Admitting: Interventional Cardiology

## 2020-01-16 ENCOUNTER — Other Ambulatory Visit: Payer: Self-pay

## 2020-01-16 ENCOUNTER — Encounter: Payer: Self-pay | Admitting: Interventional Cardiology

## 2020-01-16 VITALS — BP 120/68 | HR 51 | Ht 65.0 in | Wt 147.0 lb

## 2020-01-16 DIAGNOSIS — I10 Essential (primary) hypertension: Secondary | ICD-10-CM

## 2020-01-16 DIAGNOSIS — E785 Hyperlipidemia, unspecified: Secondary | ICD-10-CM | POA: Diagnosis not present

## 2020-01-16 DIAGNOSIS — Z720 Tobacco use: Secondary | ICD-10-CM

## 2020-01-16 DIAGNOSIS — I2510 Atherosclerotic heart disease of native coronary artery without angina pectoris: Secondary | ICD-10-CM | POA: Diagnosis not present

## 2020-01-16 DIAGNOSIS — R7303 Prediabetes: Secondary | ICD-10-CM

## 2020-01-16 NOTE — Patient Instructions (Signed)
Medication Instructions:  Your physician recommends that you continue on your current medications as directed. Please refer to the Current Medication list given to you today.  *If you need a refill on your cardiac medications before your next appointment, please call your pharmacy*   Lab Work: None ordered  If you have labs (blood work) drawn today and your tests are completely normal, you will receive your results only by: . MyChart Message (if you have MyChart) OR . A paper copy in the mail If you have any lab test that is abnormal or we need to change your treatment, we will call you to review the results.   Testing/Procedures: None ordered   Follow-Up: At [facility], you and your health needs are our priority.  As part of our continuing mission to provide you with exceptional heart care, we have created designated Provider Care Teams.  These Care Teams include your primary Cardiologist (physician) and Advanced Practice Providers (APPs -  Physician Assistants and Nurse Practitioners) who all work together to provide you with the care you need, when you need it.  We recommend signing up for the patient portal called "MyChart".  Sign up information is provided on this After Visit Summary.  MyChart is used to connect with patients for Virtual Visits (Telemedicine).  Patients are able to view lab/test results, encounter notes, upcoming appointments, etc.  Non-urgent messages can be sent to your provider as well.   To learn more about what you can do with MyChart, go to [email or web address].    Your next appointment:   12 month(s)  The format for your next appointment:   In Person  Provider:   You may see Jayadeep Varanasi, MD or one of the following Advanced Practice Providers on your designated Care Team:    Dayna Dunn, PA-C  Michele Lenze, PA-C    Other Instructions  High-Fiber Diet Fiber, also called dietary fiber, is a type of carbohydrate that is found in fruits,  vegetables, whole grains, and beans. A high-fiber diet can have many health benefits. Your health care provider may recommend a high-fiber diet to help:  Prevent constipation. Fiber can make your bowel movements more regular.  Lower your cholesterol.  Relieve the following conditions: ? Swelling of veins in the anus (hemorrhoids). ? Swelling and irritation (inflammation) of specific areas of the digestive tract (uncomplicated diverticulosis). ? A problem of the large intestine (colon) that sometimes causes pain and diarrhea (irritable bowel syndrome, IBS).  Prevent overeating as part of a weight-loss plan.  Prevent heart disease, type 2 diabetes, and certain cancers. What is my plan? The recommended daily fiber intake in grams (g) includes:  38 g for men age 50 or younger.  30 g for men over age 50.  25 g for women age 50 or younger.  21 g for women over age 50. You can get the recommended daily intake of dietary fiber by:  Eating a variety of fruits, vegetables, grains, and beans.  Taking a fiber supplement, if it is not possible to get enough fiber through your diet. What do I need to know about a high-fiber diet?  It is better to get fiber through food sources rather than from fiber supplements. There is not a lot of research about how effective supplements are.  Always check the fiber content on the nutrition facts label of any prepackaged food. Look for foods that contain 5 g of fiber or more per serving.  Talk with a diet and nutrition specialist (  dietitian) if you have questions about specific foods that are recommended or not recommended for your medical condition, especially if those foods are not listed below.  Gradually increase how much fiber you consume. If you increase your intake of dietary fiber too quickly, you may have bloating, cramping, or gas.  Drink plenty of water. Water helps you to digest fiber. What are tips for following this plan?  Eat a wide  variety of high-fiber foods.  Make sure that half of the grains that you eat each day are whole grains.  Eat breads and cereals that are made with whole-grain flour instead of refined flour or white flour.  Eat brown rice, bulgur wheat, or millet instead of white rice.  Start the day with a breakfast that is high in fiber, such as a cereal that contains 5 g of fiber or more per serving.  Use beans in place of meat in soups, salads, and pasta dishes.  Eat high-fiber snacks, such as berries, raw vegetables, nuts, and popcorn.  Choose whole fruits and vegetables instead of processed forms like juice or sauce. What foods can I eat?  Fruits Berries. Pears. Apples. Oranges. Avocado. Prunes and raisins. Dried figs. Vegetables Sweet potatoes. Spinach. Kale. Artichokes. Cabbage. Broccoli. Cauliflower. Green peas. Carrots. Squash. Grains Whole-grain breads. Multigrain cereal. Oats and oatmeal. Brown rice. Barley. Bulgur wheat. [place]. Quinoa. Bran muffins. Popcorn. Rye wafer crackers. Meats and other proteins [place], kidney, and pinto beans. Soybeans. Split peas. Lentils. Nuts and seeds. Dairy Fiber-fortified yogurt. Beverages Fiber-fortified soy milk. Fiber-fortified orange juice. Other foods Fiber bars. The items listed above may not be a complete list of recommended foods and beverages. Contact a dietitian for more options. What foods are not recommended? Fruits Fruit juice. Cooked, strained fruit. Vegetables Fried potatoes. Canned vegetables. Well-cooked vegetables. Grains White bread. Pasta made with refined flour. White rice. Meats and other proteins Fatty cuts of meat. Fried chicken or fried fish. Dairy Milk. Yogurt. Cream cheese. Sour cream. Fats and oils Butters. Beverages Soft drinks. Other foods Cakes and pastries. The items listed above may not be a complete list of foods and beverages to avoid. Contact a dietitian for more information. Summary  Fiber is a type of  carbohydrate. It is found in fruits, vegetables, whole grains, and beans.  There are many health benefits of eating a high-fiber diet, such as preventing constipation, lowering blood cholesterol, helping with weight loss, and reducing your risk of heart disease, diabetes, and certain cancers.  Gradually increase your intake of fiber. Increasing too fast can result in cramping, bloating, and gas. Drink plenty of water while you increase your fiber.  The best sources of fiber include whole fruits and vegetables, whole grains, nuts, seeds, and beans. This information is not intended to replace advice given to you by your health care provider. Make sure you discuss any questions you have with your health care provider. Document Revised: 06/22/2017 Document Reviewed: 06/22/2017 Elsevier Patient Education  [place].   Low-Sodium Eating Plan Sodium, which is an element that makes up salt, helps you maintain a healthy balance of fluids in your body. Too much sodium can increase your blood pressure and cause fluid and waste to be held in your body. Your health care provider or dietitian may recommend following this plan if you have high blood pressure (hypertension), kidney disease, liver disease, or heart failure. Eating less sodium can help lower your blood pressure, reduce swelling, and protect your heart, liver, and kidneys. What are tips  for following this plan? General guidelines  Most people on this plan should limit their sodium intake to 1,500-2,000 mg (milligrams) of sodium each day. Reading food labels   The Nutrition Facts label lists the amount of sodium in one serving of the food. If you eat more than one serving, you must multiply the listed amount of sodium by the number of servings.  Choose foods with less than 140 mg of sodium per serving.  Avoid foods with 300 mg of sodium or more per serving. Shopping  Look for lower-sodium products, often labeled as "low-sodium" or  "no salt added."  Always check the sodium content even if foods are labeled as "unsalted" or "no salt added".  Buy fresh foods. ? Avoid canned foods and premade or frozen meals. ? Avoid canned, cured, or processed meats  Buy breads that have less than 80 mg of sodium per slice. Cooking  Eat more home-cooked food and less restaurant, buffet, and fast food.  Avoid adding salt when cooking. Use salt-free seasonings or herbs instead of table salt or sea salt. Check with your health care provider or pharmacist before using salt substitutes.  Cook with plant-based oils, such as canola, sunflower, or olive oil. Meal planning  When eating at a restaurant, ask that your food be prepared with less salt or no salt, if possible.  Avoid foods that contain MSG (monosodium glutamate). MSG is sometimes added to [place] food, bouillon, and some canned foods. What foods are recommended? The items listed may not be a complete list. Talk with your dietitian about what dietary choices are best for you. Grains Low-sodium cereals, including oats, puffed wheat and rice, and shredded wheat. Low-sodium crackers. Unsalted rice. Unsalted pasta. Low-sodium bread. Whole-grain breads and whole-grain pasta. Vegetables Fresh or frozen vegetables. "No salt added" canned vegetables. "No salt added" tomato sauce and paste. Low-sodium or reduced-sodium tomato and vegetable juice. Fruits Fresh, frozen, or canned fruit. Fruit juice. Meats and other protein foods Fresh or frozen (no salt added) meat, poultry, seafood, and fish. Low-sodium canned tuna and salmon. Unsalted nuts. Dried peas, beans, and lentils without added salt. Unsalted canned beans. Eggs. Unsalted nut butters. Dairy Milk. Soy milk. Cheese that is naturally low in sodium, such as ricotta cheese, fresh mozzarella, or Swiss cheese Low-sodium or reduced-sodium cheese. Cream cheese. Yogurt. Fats and oils Unsalted butter. Unsalted margarine with no trans fat.  Vegetable oils such as canola or olive oils. Seasonings and other foods Fresh and dried herbs and spices. Salt-free seasonings. Low-sodium mustard and ketchup. Sodium-free salad dressing. Sodium-free light mayonnaise. Fresh or refrigerated horseradish. Lemon juice. Vinegar. Homemade, reduced-sodium, or low-sodium soups. Unsalted popcorn and pretzels. Low-salt or salt-free chips. What foods are not recommended? The items listed may not be a complete list. Talk with your dietitian about what dietary choices are best for you. Grains Instant hot cereals. Bread stuffing, pancake, and biscuit mixes. Croutons. Seasoned rice or pasta mixes. Noodle soup cups. Boxed or frozen macaroni and cheese. Regular salted crackers. Self-rising flour. Vegetables Sauerkraut, pickled vegetables, and relishes. Olives. [place] fries. Onion rings. Regular canned vegetables (not low-sodium or reduced-sodium). Regular canned tomato sauce and paste (not low-sodium or reduced-sodium). Regular tomato and vegetable juice (not low-sodium or reduced-sodium). Frozen vegetables in sauces. Meats and other protein foods Meat or fish that is salted, canned, smoked, spiced, or pickled. Bacon, ham, sausage, hotdogs, corned beef, chipped beef, packaged lunch meats, salt pork, jerky, pickled herring, anchovies, regular canned tuna, sardines, salted nuts. Dairy Processed cheese and  cheese spreads. Cheese curds. Blue cheese. Feta cheese. String cheese. Regular cottage cheese. Buttermilk. Canned milk. Fats and oils Salted butter. Regular margarine. Ghee. Bacon fat. Seasonings and other foods Onion salt, garlic salt, seasoned salt, table salt, and sea salt. Canned and packaged gravies. Worcestershire sauce. Tartar sauce. Barbecue sauce. Teriyaki sauce. Soy sauce, including reduced-sodium. Steak sauce. Fish sauce. Oyster sauce. Cocktail sauce. Horseradish that you find on the shelf. Regular ketchup and mustard. Meat flavorings and tenderizers.  Bouillon cubes. Hot sauce and Tabasco sauce. Premade or packaged marinades. Premade or packaged taco seasonings. Relishes. Regular salad dressings. Salsa. Potato and tortilla chips. Corn chips and puffs. Salted popcorn and pretzels. Canned or dried soups. Pizza. Frozen entrees and pot pies. Summary  Eating less sodium can help lower your blood pressure, reduce swelling, and protect your heart, liver, and kidneys.  Most people on this plan should limit their sodium intake to 1,500-2,000 mg (milligrams) of sodium each day.  Canned, boxed, and frozen foods are high in sodium. Restaurant foods, fast foods, and pizza are also very high in sodium. You also get sodium by adding salt to food.  Try to cook at home, eat more fresh fruits and vegetables, and eat less fast food, canned, processed, or prepared foods. This information is not intended to replace advice given to you by your health care provider. Make sure you discuss any questions you have with your health care provider. Document Revised: 07/31/2017 Document Reviewed: 08/11/2016 Elsevier Patient Education  2020 [employer].

## 2020-04-11 ENCOUNTER — Other Ambulatory Visit (HOSPITAL_COMMUNITY)
Admission: RE | Admit: 2020-04-11 | Discharge: 2020-04-11 | Disposition: A | Discharge status: Home / Self Care | Payer: BC Managed Care – PPO | Source: Ambulatory Visit | Attending: Family Medicine | Admitting: Family Medicine

## 2020-04-11 DIAGNOSIS — Z124 Encounter for screening for malignant neoplasm of cervix: Secondary | ICD-10-CM | POA: Diagnosis not present

## 2020-04-12 LAB — CYTOLOGY - PAP
Comment: NEGATIVE
Diagnosis: NEGATIVE
High risk HPV: NEGATIVE

## 2020-08-29 ENCOUNTER — Other Ambulatory Visit: Payer: Self-pay | Admitting: Hematology and Oncology

## 2020-08-29 DIAGNOSIS — Z1231 Encounter for screening mammogram for malignant neoplasm of breast: Secondary | ICD-10-CM

## 2020-10-08 ENCOUNTER — Other Ambulatory Visit: Payer: Self-pay

## 2020-10-08 ENCOUNTER — Ambulatory Visit
Admission: RE | Admit: 2020-10-08 | Discharge: 2020-10-08 | Disposition: A | Discharge status: Home / Self Care | Payer: BC Managed Care – PPO | Source: Ambulatory Visit | Attending: Hematology and Oncology | Admitting: Hematology and Oncology

## 2020-10-08 DIAGNOSIS — Z1231 Encounter for screening mammogram for malignant neoplasm of breast: Secondary | ICD-10-CM

## 2020-10-10 ENCOUNTER — Other Ambulatory Visit: Payer: Self-pay | Admitting: Hematology and Oncology

## 2020-10-10 DIAGNOSIS — R928 Other abnormal and inconclusive findings on diagnostic imaging of breast: Secondary | ICD-10-CM

## 2020-10-26 ENCOUNTER — Other Ambulatory Visit: Payer: Self-pay

## 2020-10-26 ENCOUNTER — Ambulatory Visit
Admission: RE | Admit: 2020-10-26 | Discharge: 2020-10-26 | Disposition: A | Discharge status: Home / Self Care | Payer: BC Managed Care – PPO | Source: Ambulatory Visit | Attending: Hematology and Oncology | Admitting: Hematology and Oncology

## 2020-10-26 ENCOUNTER — Other Ambulatory Visit: Payer: Self-pay | Admitting: Hematology and Oncology

## 2020-10-26 DIAGNOSIS — R928 Other abnormal and inconclusive findings on diagnostic imaging of breast: Secondary | ICD-10-CM

## 2020-10-26 DIAGNOSIS — R921 Mammographic calcification found on diagnostic imaging of breast: Secondary | ICD-10-CM

## 2020-11-02 ENCOUNTER — Ambulatory Visit
Admission: RE | Admit: 2020-11-02 | Discharge: 2020-11-02 | Disposition: A | Discharge status: Home / Self Care | Payer: BC Managed Care – PPO | Source: Ambulatory Visit | Attending: Hematology and Oncology | Admitting: Hematology and Oncology

## 2020-11-02 ENCOUNTER — Other Ambulatory Visit: Payer: Self-pay

## 2020-11-02 DIAGNOSIS — R928 Other abnormal and inconclusive findings on diagnostic imaging of breast: Secondary | ICD-10-CM

## 2020-11-02 DIAGNOSIS — R921 Mammographic calcification found on diagnostic imaging of breast: Secondary | ICD-10-CM

## 2020-12-11 ENCOUNTER — Other Ambulatory Visit: Payer: Self-pay | Admitting: Neurology

## 2021-01-03 ENCOUNTER — Ambulatory Visit: Payer: BC Managed Care – PPO | Admitting: Neurology

## 2021-01-13 NOTE — Progress Notes (Signed)
Cardiology Office Note   Date:  01/14/2021   ID:  Amber [name], DOB 1960-09-07, MRN [id number]  PCP:  [name], MD    No chief complaint on file.  CAD  Wt Readings from Last 3 Encounters:  01/14/21 153 lb 6.4 oz (69.6 kg)  01/16/20 147 lb (66.7 kg)  06/29/19 145 lb 9.6 oz (66 kg)       History of Present Illness: Amber [name] is a 60 y.o. female  Who had an anterior MI in 2001. She had a stent placed by Dr. Leonia Reeves. Prior to the MI, her Mom had an MI which prompted a stress test in the patient which was normal.  First hip replacement in 2013. She had second hip (left) replacement in 12/16. - Dr. Rhona Raider.  No heart issues at the time of the surgery.   At the time of her heart attackin 2001, she had nausea, diaphoresis and chest pain. SHe knew it was a heart attack.  No heart cath since then.  She has stopped smoking in Feb 2018.She has been bradycardic in the past.  Denies : Chest pain. Dizziness. Leg edema. Nitroglycerin use. Orthopnea. Palpitations. Paroxysmal nocturnal dyspnea. Shortness of breath. Syncope.     Past Medical History:  Diagnosis Date  . Allergic rhinitis   . Anginal pain ([place]) 2001  . Breast cancer ([place])    invasive ductal carcinoma right breast; ER, PR + and HER 2 -  . Chronic neck pain    hx of  . Diabetes mellitus without complication ([place])   . DJD (degenerative joint disease) of hip    bil hip  . GERD (gastroesophageal reflux disease)    hx of   . Heart attack [facility]) 2001   Dr. Scarlette Calico  . History of radiation therapy 09/16/12- 10/19/12   right breast  . Hyperlipidemia   . Hypertension    Dr. [facility]  . Hypothyroidism   . Migraines    "last one was 1990's" (08/17/2012)  . Neuromuscular disorder ([place])    carpal tunnel right hand  . Osteoporosis   . Pernicious anemia   . Sickle cell trait ([place])   . Sleep apnea    does use a cpap  . Stroke ([place])   . Tobacco abuse   . Wears  glasses     Past Surgical History:  Procedure Laterality Date  . BREAST BIOPSY  06/2012   "right" (08/17/2012)  . COLONOSCOPY    . CORONARY ANGIOPLASTY WITH STENT PLACEMENT  2001   "1" (08/17/2012)  . DORSAL COMPARTMENT RELEASE Left 08/18/2014   Procedure: LEFT FIRST RELEASE DORSAL COMPARTMENT ([place]);  Surgeon: Hessie Dibble, MD;  Location: [place];  Service: Orthopedics;  Laterality: Left;  . NASAL SINUS SURGERY  1990  . PARTIAL MASTECTOMY WITH NEEDLE LOCALIZATION AND AXILLARY SENTINEL LYMPH NODE BX  07/15/2012   Procedure: PARTIAL MASTECTOMY WITH NEEDLE LOCALIZATION AND AXILLARY SENTINEL LYMPH NODE BX;  Surgeon: Adin Hector, MD;  Location: [place];  Service: General;  Laterality: Right;  . TOTAL HIP ARTHROPLASTY  08/17/2012   "right" (08/17/2012)  . TOTAL HIP ARTHROPLASTY  08/17/2012   Procedure: TOTAL HIP ARTHROPLASTY ANTERIOR APPROACH;  Surgeon: Hessie Dibble, MD;  Location: [place];  Service: Orthopedics;  Laterality: Right;  . TOTAL HIP ARTHROPLASTY Left 08/21/2015  . TOTAL HIP ARTHROPLASTY Left 08/21/2015   Procedure: TOTAL HIP ARTHROPLASTY ANTERIOR APPROACH AND RIGHT TRIGGER THUMB RELEASE;  Surgeon: Melrose Nakayama, MD;  Location: [place];  Service: Orthopedics;  Laterality: Left;     Current Outpatient Medications  Medication Sig Dispense Refill  . amoxicillin (AMOXIL) 500 MG capsule Take 500 mg by mouth as directed. Takes for dental cleaning or any type of procedures may have    . aspirin EC 81 MG tablet Take 1 tablet (81 mg total) by mouth daily. 90 tablet 3  . atenolol (TENORMIN) 25 MG tablet Take 1 tablet (25 mg total) by mouth daily. 90 tablet 3  . BIOTIN PO Take 2 tablets by mouth daily.    . cholecalciferol (VITAMIN D) 1000 units tablet Take 1,000 Units by mouth daily.     . Coenzyme Q10 (COQ10 PO) Take 1 tablet by mouth daily.    . cyanocobalamin (,VITAMIN B-12,) 1000 MCG/ML injection Inject 1,000 mcg into the muscle every 30 (thirty)  days. Reported on 02/11/2016    . ezetimibe (ZETIA) 10 MG tablet Take 1 tablet (10 mg total) by mouth daily. 90 tablet 3  . gabapentin (NEURONTIN) 300 MG capsule Take 1 capsule by mouth at bedtime 30 capsule 0  . IRON PO Take 1 tablet by mouth at bedtime as needed. When feeling tired    . levothyroxine (SYNTHROID) 88 MCG tablet Take 88 mcg by mouth daily before breakfast.    . lisinopril (ZESTRIL) 5 MG tablet Take 1 tablet (5 mg total) by mouth daily. 90 tablet 3  . metFORMIN (GLUCOPHAGE) 500 MG tablet Take 1,000 mg by mouth 2 (two) times daily with a meal.    . mometasone (NASONEX) 50 MCG/ACT nasal spray Place 2 sprays into the nose daily as needed. For allergies    . nitroGLYCERIN (NITROSTAT) 0.4 MG SL tablet Place 0.4 mg under the tongue every 5 (five) minutes as needed for chest pain. Reported on 02/11/2016    . Omega-3 Fatty Acids (FISH OIL PO) Take 1 capsule by mouth daily.    . rosuvastatin (CRESTOR) 20 MG tablet Take 1 tablet (20 mg total) by mouth every other day. 45 tablet 3  . triamcinolone (KENALOG) 0.1 % paste Use as directed 1 application in the mouth or throat as needed (FOR GUMS).     . valACYclovir (VALTREX) 500 MG tablet Take 500 mg by mouth daily as needed (Cold sore). Reported on 02/11/2016    . zolpidem (AMBIEN) 10 MG tablet Take 10 mg by mouth at bedtime as needed for sleep. Reported on 02/11/2016     No current facility-administered medications for this visit.    Allergies:   Tramadol, Benzoyl peroxide, and Betadine [povidone iodine]    Social History:  The patient  reports that she has quit smoking. Her smoking use included cigarettes. She has a 35.00 pack-year smoking history. She has never used smokeless tobacco. She reports current alcohol use. She reports that she does not use drugs.   Family History:  The patient's family history includes Cancer in her cousin and maternal grandmother; Diabetes in her mother; Heart disease in her mother; Parkinson's disease in her  maternal grandfather.    ROS:  Please see the history of present illness.   Otherwise, review of systems are positive for craving for cigarettes.   All other systems are reviewed and negative.    PHYSICAL EXAM: VS:  BP (!) 110/58   Pulse (!) 50   Ht 5\' 5"  (1.651 m)   Wt 153 lb 6.4 oz (69.6 kg)   SpO2 98%   BMI 25.53 kg/m  , BMI Body mass index is  25.53 kg/m. GEN: Well nourished, well developed, in no acute distress  HEENT: normal  Neck: no JVD, carotid bruits, or masses Cardiac: bradycardic; no murmurs, rubs, or gallops,no edema  Respiratory:  clear to auscultation bilaterally, normal work of breathing GI: soft, nontender, nondistended, + BS MS: no deformity or atrophy  Skin: warm and dry, no rash Neuro:  Strength and sensation are intact Psych: euthymic mood, full affect   EKG:   The ekg ordered today demonstrates sinus bradycardia, no ST changes   Recent Labs: No results found for requested labs within last 8760 hours.   Lipid Panel    Component Value Date/Time   CHOL 115 01/04/2018 0754   TRIG 68 01/04/2018 0754   HDL 47 01/04/2018 0754   CHOLHDL 2.4 01/04/2018 0754   CHOLHDL 2 11/02/2014 0806   VLDL 16.4 11/02/2014 0806   LDLCALC 54 01/04/2018 0754     Other studies Reviewed: Additional studies/ records that were reviewed today with results demonstrating: labs reviewed.   ASSESSMENT AND PLAN:  1. CAD/Old MI: Continue aggressive secondary prevention.  Exercise target as noted below.  No angina on medical therapy.  2. Hyperlipidemia: Whole food, plant-based diet recommended.  LDL 77. 3. HTN: Low-salt diet. The current medical regimen is effective;  continue present plan and medications.  COntinue lisinopril.   4. Tobacco abuse: She needs to avoid tobacco products.  Quit in 2018. 5. Diabetes: Increase fiber intake.  Increasing metformin. A1C 7 in 2022. 6. Bradycardia: No syncope.     Current medicines are reviewed at length with the patient today.  The  patient concerns regarding her medicines were addressed.  The following changes have been made:  No change  Labs/ tests ordered today include:  No orders of the defined types were placed in this encounter.   Recommend 150 minutes/week of aerobic exercise Low fat, low carb, high fiber diet recommended  Disposition:   FU in 1 year   Signed, Larae Grooms, MD  01/14/2021 8:32 AM    [facility] [address], [place], [place]  [address] Phone: [phone]; Fax: [phone]

## 2021-01-14 ENCOUNTER — Encounter: Payer: Self-pay | Admitting: Interventional Cardiology

## 2021-01-14 ENCOUNTER — Other Ambulatory Visit: Payer: Self-pay

## 2021-01-14 ENCOUNTER — Ambulatory Visit: Payer: BC Managed Care – PPO | Admitting: Interventional Cardiology

## 2021-01-14 VITALS — BP 110/58 | HR 50 | Ht 65.0 in | Wt 153.4 lb

## 2021-01-14 DIAGNOSIS — I10 Essential (primary) hypertension: Secondary | ICD-10-CM

## 2021-01-14 DIAGNOSIS — I2510 Atherosclerotic heart disease of native coronary artery without angina pectoris: Secondary | ICD-10-CM | POA: Diagnosis not present

## 2021-01-14 DIAGNOSIS — Z720 Tobacco use: Secondary | ICD-10-CM

## 2021-01-14 DIAGNOSIS — R7303 Prediabetes: Secondary | ICD-10-CM | POA: Diagnosis not present

## 2021-01-14 DIAGNOSIS — E782 Mixed hyperlipidemia: Secondary | ICD-10-CM

## 2021-01-14 NOTE — Patient Instructions (Signed)
Medication Instructions:  Your physician recommends that you continue on your current medications as directed. Please refer to the Current Medication list given to you today.  *If you need a refill on your cardiac medications before your next appointment, please call your pharmacy*   Lab Work: none If you have labs (blood work) drawn today and your tests are completely normal, you will receive your results only by: . MyChart Message (if you have MyChart) OR . A paper copy in the mail If you have any lab test that is abnormal or we need to change your treatment, we will call you to review the results.   Testing/Procedures: none   Follow-Up: At [facility], you and your health needs are our priority.  As part of our continuing mission to provide you with exceptional heart care, we have created designated Provider Care Teams.  These Care Teams include your primary Cardiologist (physician) and Advanced Practice Providers (APPs -  Physician Assistants and Nurse Practitioners) who all work together to provide you with the care you need, when you need it.  We recommend signing up for the patient portal called "MyChart".  Sign up information is provided on this After Visit Summary.  MyChart is used to connect with patients for Virtual Visits (Telemedicine).  Patients are able to view lab/test results, encounter notes, upcoming appointments, etc.  Non-urgent messages can be sent to your provider as well.   To learn more about what you can do with MyChart, go to [email or web address].    Your next appointment:   12 month(s)  The format for your next appointment:   In Person  Provider:   You may see Jayadeep Varanasi, MD or one of the following Advanced Practice Providers on your designated Care Team:    Dayna Dunn, PA-C  Michele Lenze, PA-C    Other Instructions  High-Fiber Eating Plan Fiber, also called dietary fiber, is a type of carbohydrate. It is found foods such as fruits,  vegetables, whole grains, and beans. A high-fiber diet can have many health benefits. Your health care provider may recommend a high-fiber diet to help:  Prevent constipation. Fiber can make your bowel movements more regular.  Lower your cholesterol.  Relieve the following conditions: ? Inflammation of veins in the anus (hemorrhoids). ? Inflammation of specific areas of the digestive tract (uncomplicated diverticulosis). ? A problem of the large intestine, also called the colon, that sometimes causes pain and diarrhea (irritable bowel syndrome, or IBS).  Prevent overeating as part of a weight-loss plan.  Prevent heart disease, type 2 diabetes, and certain cancers. What are tips for following this plan? Reading food labels  Check the nutrition facts label on food products for the amount of dietary fiber. Choose foods that have 5 grams of fiber or more per serving.  The goals for recommended daily fiber intake include: ? Men (age 50 or younger): 34-38 g. ? Men (over age 50): 28-34 g. ? Women (age 50 or younger): 25-28 g. ? Women (over age 50): 22-25 g. Your daily fiber goal is _____________ g.   Shopping  Choose whole fruits and vegetables instead of processed forms, such as apple juice or applesauce.  Choose a wide variety of high-fiber foods such as avocados, lentils, oats, and kidney beans.  Read the nutrition facts label of the foods you choose. Be aware of foods with added fiber. These foods often have high sugar and sodium amounts per serving. Cooking  Use whole-grain flour for baking and cooking.    Cook with brown rice instead of white rice. Meal planning  Start the day with a breakfast that is high in fiber, such as a cereal that contains 5 g of fiber or more per serving.  Eat breads and cereals that are made with whole-grain flour instead of refined flour or white flour.  Eat brown rice, bulgur wheat, or millet instead of white rice.  Use beans in place of meat in  soups, salads, and pasta dishes.  Be sure that half of the grains you eat each day are whole grains. General information  You can get the recommended daily intake of dietary fiber by: ? Eating a variety of fruits, vegetables, grains, nuts, and beans. ? Taking a fiber supplement if you are not able to take in enough fiber in your diet. It is better to get fiber through food than from a supplement.  Gradually increase how much fiber you consume. If you increase your intake of dietary fiber too quickly, you may have bloating, cramping, or gas.  Drink plenty of water to help you digest fiber.  Choose high-fiber snacks, such as berries, raw vegetables, nuts, and popcorn. What foods should I eat? Fruits Berries. Pears. Apples. Oranges. Avocado. Prunes and raisins. Dried figs. Vegetables Sweet potatoes. Spinach. Kale. Artichokes. Cabbage. Broccoli. Cauliflower. Green peas. Carrots. Squash. Grains Whole-grain breads. Multigrain cereal. Oats and oatmeal. Brown rice. Barley. Bulgur wheat. Millet. Quinoa. Bran muffins. Popcorn. Rye wafer crackers. Meats and other proteins Navy beans, kidney beans, and pinto beans. Soybeans. Split peas. Lentils. Nuts and seeds. Dairy Fiber-fortified yogurt. Beverages Fiber-fortified soy milk. Fiber-fortified orange juice. Other foods Fiber bars. The items listed above may not be a complete list of recommended foods and beverages. Contact a dietitian for more information. What foods should I avoid? Fruits Fruit juice. Cooked, strained fruit. Vegetables Fried potatoes. Canned vegetables. Well-cooked vegetables. Grains White bread. Pasta made with refined flour. White rice. Meats and other proteins Fatty cuts of meat. Fried chicken or fried fish. Dairy Milk. Yogurt. Cream cheese. Sour cream. Fats and oils Butters. Beverages Soft drinks. Other foods Cakes and pastries. The items listed above may not be a complete list of foods and beverages to avoid.  Talk with your dietitian about what choices are best for you. Summary  Fiber is a type of carbohydrate. It is found in foods such as fruits, vegetables, whole grains, and beans.  A high-fiber diet has many benefits. It can help to prevent constipation, lower blood cholesterol, aid weight loss, and reduce your risk of heart disease, diabetes, and certain cancers.  Increase your intake of fiber gradually. Increasing fiber too quickly may cause cramping, bloating, and gas. Drink plenty of water while you increase the amount of fiber you consume.  The best sources of fiber include whole fruits and vegetables, whole grains, nuts, seeds, and beans. This information is not intended to replace advice given to you by your health care provider. Make sure you discuss any questions you have with your health care provider. Document Revised: 12/22/2019 Document Reviewed: 12/22/2019 Elsevier Patient Education  2021 Elsevier Inc.   

## 2021-01-27 ENCOUNTER — Other Ambulatory Visit: Payer: Self-pay | Admitting: Neurology

## 2021-01-29 NOTE — Progress Notes (Signed)
NEUROLOGY FOLLOW UP OFFICE NOTE  Amber [name] [id number]  Assessment/Plan:   Diabetic polyneuropathy  1.  Gabapentin 300mg  at bedtime  2.  Follow up one year  Subjective:  Amber [name] is a 60 year old right-handed black woman with CAD, type 2 diabetes mellitus, HTN, hyperlipidemia, tobacco use disorder and history of stroke and breast cancer(no chemotherapy)who follows up for diabetic polyneuropathy  UPDATE: Current medication:  Gabapentin 300mg  at bedtime She is doing well.  Pain is controlled.   HISTORY: For several years, she experienced intermittent discomfort (tingling/burning) in feet and legs, particularly at night. Last year, she was diagnosed with diabetes. Since then, she has been having neuropathy (right foot worse than left foot). It feels like pins and needles in the feet as well as burning. Big toe on the right feels tender. Notes some numbness. No falls. She feels that the symptoms are unchanged.   Her blood sugars have been good. Typically morning readings are less than 130 and second reading 150. Occasionally in the 170s. Hgb A1c from 09/09/18 was 6.3. She also has history of B12 deficiency/pernicious anemiafor which she takes injections and over the counter pills. Level from 09/09/18 was >1525. She was told to stop the oral pills and symptoms worsened.She has history of hypothyroidism well controlled. TSH from 09/09/18 was 2.76.   PAST MEDICAL HISTORY: Past Medical History:  Diagnosis Date  . Allergic rhinitis   . Anginal pain ([place]) 2001  . Breast cancer ([place])    invasive ductal carcinoma right breast; ER, PR + and HER 2 -  . Chronic neck pain    hx of  . Diabetes mellitus without complication ([place])   . DJD (degenerative joint disease) of hip    bil hip  . GERD (gastroesophageal reflux disease)    hx of   . Heart attack [facility]) 2001   Dr. Scarlette Calico  . History of radiation therapy 09/16/12- 10/19/12   right breast  . Hyperlipidemia    . Hypertension    Dr. [facility]  . Hypothyroidism   . Migraines    "last one was 1990's" (08/17/2012)  . Neuromuscular disorder ([place])    carpal tunnel right hand  . Osteoporosis   . Pernicious anemia   . Sickle cell trait ([place])   . Sleep apnea    does use a cpap  . Stroke ([place])   . Tobacco abuse   . Wears glasses     MEDICATIONS: Current Outpatient Medications on File Prior to Visit  Medication Sig Dispense Refill  . amoxicillin (AMOXIL) 500 MG capsule Take 500 mg by mouth as directed. Takes for dental cleaning or any type of procedures may have    . aspirin EC 81 MG tablet Take 1 tablet (81 mg total) by mouth daily. 90 tablet 3  . atenolol (TENORMIN) 25 MG tablet Take 1 tablet (25 mg total) by mouth daily. 90 tablet 3  . BIOTIN PO Take 2 tablets by mouth daily.    . cholecalciferol (VITAMIN D) 1000 units tablet Take 1,000 Units by mouth daily.     . Coenzyme Q10 (COQ10 PO) Take 1 tablet by mouth daily.    . cyanocobalamin (,VITAMIN B-12,) 1000 MCG/ML injection Inject 1,000 mcg into the muscle every 30 (thirty) days. Reported on 02/11/2016    . ezetimibe (ZETIA) 10 MG tablet Take 1 tablet (10 mg total) by mouth daily. 90 tablet 3  . gabapentin (NEURONTIN) 300 MG capsule Take 1 capsule by mouth  at bedtime 30 capsule 0  . IRON PO Take 1 tablet by mouth at bedtime as needed. When feeling tired    . levothyroxine (SYNTHROID) 88 MCG tablet Take 88 mcg by mouth daily before breakfast.    . lisinopril (ZESTRIL) 5 MG tablet Take 1 tablet (5 mg total) by mouth daily. 90 tablet 3  . metFORMIN (GLUCOPHAGE) 500 MG tablet Take 1,000 mg by mouth 2 (two) times daily with a meal.    . mometasone (NASONEX) 50 MCG/ACT nasal spray Place 2 sprays into the nose daily as needed. For allergies    . nitroGLYCERIN (NITROSTAT) 0.4 MG SL tablet Place 0.4 mg under the tongue every 5 (five) minutes as needed for chest pain. Reported on 02/11/2016    . Omega-3 Fatty Acids (FISH OIL PO)  Take 1 capsule by mouth daily.    . rosuvastatin (CRESTOR) 20 MG tablet Take 1 tablet (20 mg total) by mouth every other day. 45 tablet 3  . triamcinolone (KENALOG) 0.1 % paste Use as directed 1 application in the mouth or throat as needed (FOR GUMS).     . valACYclovir (VALTREX) 500 MG tablet Take 500 mg by mouth daily as needed (Cold sore). Reported on 02/11/2016    . zolpidem (AMBIEN) 10 MG tablet Take 10 mg by mouth at bedtime as needed for sleep. Reported on 02/11/2016     No current facility-administered medications on file prior to visit.    ALLERGIES: Allergies  Allergen Reactions  . Tramadol Nausea And Vomiting    "couldn't stop vomiting" (08/17/2012)  . Benzoyl Peroxide Itching and Rash  . Betadine [Povidone Iodine] Rash    FAMILY HISTORY: Family History  Problem Relation Age of Onset  . Diabetes Mother   . Heart disease Mother   . Cancer Maternal Grandmother        pancreatic  . Cancer Cousin        pancreatic  . Parkinson's disease Maternal Grandfather       Objective:  Blood pressure (!) 107/57, pulse 69, height 5\' 5"  (1.651 m), weight 151 lb (68.5 kg), SpO2 97 %. General: No acute distress.  Patient appears well-groomed.   Head:  Normocephalic/atraumatic Eyes:  Fundi examined but not visualized Neck: supple, no paraspinal tenderness, full range of motion Heart:  Regular rate and rhythm Lungs:  Clear to auscultation bilaterally Back: No paraspinal tenderness Neurological Exam: alert and oriented to person, place, and time. Speech fluent and not dysarthric, language intact.  CN II-XII intact. Bulk and tone normal, muscle strength 5/5 throughout.  Sensation to pinprick and vibration intact.  Deep tendon reflexes 2+ throughout, toes downgoing.  Finger to nose and heel to shin testing intact.  Gait normal, Romberg negative.     Metta Clines, DO  CC: [name], MD

## 2021-01-31 ENCOUNTER — Other Ambulatory Visit: Payer: Self-pay

## 2021-01-31 ENCOUNTER — Encounter: Payer: Self-pay | Admitting: Neurology

## 2021-01-31 ENCOUNTER — Ambulatory Visit (INDEPENDENT_AMBULATORY_CARE_PROVIDER_SITE_OTHER): Payer: BC Managed Care – PPO | Admitting: Neurology

## 2021-01-31 VITALS — BP 107/57 | HR 69 | Ht 65.0 in | Wt 151.0 lb

## 2021-01-31 DIAGNOSIS — E1142 Type 2 diabetes mellitus with diabetic polyneuropathy: Secondary | ICD-10-CM

## 2021-01-31 MED ORDER — GABAPENTIN 300 MG PO CAPS: 1.0000 | ORAL_CAPSULE | Freq: Every day | ORAL | 1 refills | Status: DC

## 2021-01-31 NOTE — Patient Instructions (Signed)
Refilled gabapentin

## 2021-06-07 ENCOUNTER — Other Ambulatory Visit: Payer: Self-pay

## 2021-06-07 ENCOUNTER — Encounter: Payer: Self-pay | Admitting: Podiatry

## 2021-06-07 ENCOUNTER — Ambulatory Visit: Payer: BC Managed Care – PPO | Admitting: Podiatry

## 2021-06-07 DIAGNOSIS — L03031 Cellulitis of right toe: Secondary | ICD-10-CM

## 2021-06-07 DIAGNOSIS — L84 Corns and callosities: Secondary | ICD-10-CM | POA: Diagnosis not present

## 2021-06-07 DIAGNOSIS — M216X9 Other acquired deformities of unspecified foot: Secondary | ICD-10-CM

## 2021-06-07 NOTE — Patient Instructions (Signed)

## 2021-06-07 NOTE — Progress Notes (Signed)
Subjective:   Patient ID: Amber [name], female   DOB: 60 y.o.   MRN: [id number]   HPI Patient presents stating she has had pain with the nail of her right big toe and that there was some clear drainage and also she had irritation between her 2 toes that make it sore.  She is not been seen for a number of years also is concerned about a callus on the bottom of her left foot that gets sore and makes walking difficult.  Does not smoke currently likes to be active   Review of Systems  All other systems reviewed and are negative.      Objective:  Physical Exam Vitals and nursing note reviewed.  Constitutional:      Appearance: She is well-developed.  Pulmonary:     Effort: Pulmonary effort is normal.  Musculoskeletal:        General: Normal range of motion.  Skin:    General: Skin is warm.  Neurological:     Mental Status: She is alert.    Neurovascular status found to be intact muscle strength found to be adequate range of motion adequate.  Lateral border right hallux is incurvated localized to the area no active drainage or erythema noted currently no proximal edema erythema drainage noted.  Patient is noted to have a keratotic lesions of the first metatarsal left that is painful when pressed makes walking difficult at times but is localized to this area with no other pathology and is doing a good job of controlling her sugar     Assessment:  History of paronychia infection right hallux appears to be resolved currently with mild ingrown toenail along with keratotic lesion with plantarflexed first metatarsal left mild cavus deformity     Plan:  H&P reviewed all conditions for the right recommended soaks therapy for the left debrided the lesion and wear cushioned shoes do not recommend further treatment.  Patient had all questions answered today and had different elements discussed concerning diabetes and foot care

## 2021-07-01 ENCOUNTER — Other Ambulatory Visit: Payer: Self-pay | Admitting: Physician Assistant

## 2021-07-01 DIAGNOSIS — I6523 Occlusion and stenosis of bilateral carotid arteries: Secondary | ICD-10-CM

## 2021-07-12 ENCOUNTER — Ambulatory Visit
Admission: RE | Admit: 2021-07-12 | Discharge: 2021-07-12 | Disposition: A | Discharge status: Home / Self Care | Payer: BC Managed Care – PPO | Source: Ambulatory Visit | Attending: Physician Assistant | Admitting: Physician Assistant

## 2021-07-12 DIAGNOSIS — I6523 Occlusion and stenosis of bilateral carotid arteries: Secondary | ICD-10-CM

## 2021-07-15 ENCOUNTER — Other Ambulatory Visit: Payer: Self-pay | Admitting: Neurology

## 2021-07-23 ENCOUNTER — Ambulatory Visit (HOSPITAL_BASED_OUTPATIENT_CLINIC_OR_DEPARTMENT_OTHER): Payer: BC Managed Care – PPO | Admitting: Family

## 2021-07-23 ENCOUNTER — Other Ambulatory Visit: Payer: Self-pay

## 2021-07-23 ENCOUNTER — Encounter (HOSPITAL_BASED_OUTPATIENT_CLINIC_OR_DEPARTMENT_OTHER): Payer: Self-pay | Admitting: Nurse Practitioner

## 2021-07-23 VITALS — BP 120/74 | HR 52 | Ht 65.0 in | Wt 149.0 lb

## 2021-07-23 DIAGNOSIS — I10 Essential (primary) hypertension: Secondary | ICD-10-CM

## 2021-07-23 DIAGNOSIS — I2510 Atherosclerotic heart disease of native coronary artery without angina pectoris: Secondary | ICD-10-CM

## 2021-07-23 DIAGNOSIS — E785 Hyperlipidemia, unspecified: Secondary | ICD-10-CM

## 2021-07-23 DIAGNOSIS — I6523 Occlusion and stenosis of bilateral carotid arteries: Secondary | ICD-10-CM | POA: Diagnosis not present

## 2021-07-23 NOTE — Patient Instructions (Addendum)
Medication Instructions:  Your Physician recommend you continue on your current medication as directed.    *If you need a refill on your cardiac medications before your next appointment, please call your pharmacy*   Lab Work: None ordered today   Testing/Procedures: NOV 2023 Your physician has requested that you have a carotid duplex. This test is an ultrasound of the carotid arteries in your neck. It looks at blood flow through these arteries that supply the brain with blood. Allow one hour for this exam. There are no restrictions or special instructions.   Follow-Up: At [facility], you and your health needs are our priority.  As part of our continuing mission to provide you with exceptional heart care, we have created designated Provider Care Teams.  These Care Teams include your primary Cardiologist (physician) and Advanced Practice Providers (APPs -  Physician Assistants and Nurse Practitioners) who all work together to provide you with the care you need, when you need it.  We recommend signing up for the patient portal called "MyChart".  Sign up information is provided on this After Visit Summary.  MyChart is used to connect with patients for Virtual Visits (Telemedicine).  Patients are able to view lab/test results, encounter notes, upcoming appointments, etc.  Non-urgent messages can be sent to your provider as well.   To learn more about what you can do with MyChart, go to [email or web address].    Your next appointment:   May 2023 ( recall is already in)  The format for your next appointment:   In Person  Provider:   Dr. Irish Lack      Other Instructions Your carotid duplex on 07/12/21 showed a minimal amount of plaque. There was no significant stenosis (blockage). This means that while there is a small amount of plaque there is no restriction in the blood flow. This is a good result!  We will plan to pursue optimal cholesterol control to prevent this from progressing.  Below are our goals for cholesterol and your recent results from 04/2021 with Dr. Tamala Julian.   Type Goal Your 04/2021 result  Total cholesterol <200 130  HDL (good cholesterol) >40 54  LDL (bad cholesterol) <70 60  Triglycerides <150 74

## 2021-07-23 NOTE — Progress Notes (Signed)
Office Visit    Patient Name: Amber [name] Date of Encounter: 07/23/2021  PCP:  [name], [place]  Cardiologist:  Larae Grooms, MD  Advanced Practice Provider:  No care team member to display Electrophysiologist:  None      Chief Complaint    Amber [name] is a 60 y.o. female with a hx of CAD s/p anterior MI 2001 with coronary stenting, previous tobacco use, hyperlipidemia, hypertension, hypothyroidism, OSA on CPAP presents today for concern for carotid stenosis.    Past Medical History    Past Medical History:  Diagnosis Date   Allergic rhinitis    Anginal pain ([place]) 2001   Breast cancer ([place])    invasive ductal carcinoma right breast; ER, PR + and HER 2 -   Chronic neck pain    hx of   Diabetes mellitus without complication (HCC)    DJD (degenerative joint disease) of hip    bil hip   GERD (gastroesophageal reflux disease)    hx of    Heart attack [facility]) 2001   Dr. Scarlette Calico   History of radiation therapy 09/16/12- 10/19/12   right breast   Hyperlipidemia    Hypertension    Dr. Vic[facility] Huger family medicine   Hypothyroidism    Migraines    "last one was 56's" (08/17/2012)   Neuromuscular disorder ([place])    carpal tunnel right hand   Osteoporosis    Pernicious anemia    Sickle cell trait ([place])    Sleep apnea    does use a cpap   Stroke ([place])    Tobacco abuse    Wears glasses    Past Surgical History:  Procedure Laterality Date   BREAST BIOPSY  06/2012   "right" (08/17/2012)   COLONOSCOPY     CORONARY ANGIOPLASTY WITH STENT PLACEMENT  2001   "1" (08/17/2012)   DORSAL COMPARTMENT RELEASE Left 08/18/2014   Procedure: LEFT FIRST RELEASE DORSAL COMPARTMENT ([place]);  Surgeon: Hessie Dibble, MD;  Location: [place];  Service: Orthopedics;  Laterality: Left;   NASAL SINUS SURGERY  1990   PARTIAL MASTECTOMY WITH NEEDLE LOCALIZATION AND AXILLARY SENTINEL LYMPH NODE BX  07/15/2012    Procedure: PARTIAL MASTECTOMY WITH NEEDLE LOCALIZATION AND AXILLARY SENTINEL LYMPH NODE BX;  Surgeon: Adin Hector, MD;  Location: [place];  Service: General;  Laterality: Right;   TOTAL HIP ARTHROPLASTY  08/17/2012   "right" (08/17/2012)   TOTAL HIP ARTHROPLASTY  08/17/2012   Procedure: TOTAL HIP ARTHROPLASTY ANTERIOR APPROACH;  Surgeon: Hessie Dibble, MD;  Location: [place];  Service: Orthopedics;  Laterality: Right;   TOTAL HIP ARTHROPLASTY Left 08/21/2015   TOTAL HIP ARTHROPLASTY Left 08/21/2015   Procedure: TOTAL HIP ARTHROPLASTY ANTERIOR APPROACH AND RIGHT TRIGGER THUMB RELEASE;  Surgeon: Melrose Nakayama, MD;  Location: [place];  Service: Orthopedics;  Laterality: Left;    Allergies  Allergies  Allergen Reactions   Tramadol Nausea And Vomiting    "couldn't stop vomiting" (08/17/2012)   Benzoyl Peroxide Itching and Rash   Betadine [Povidone Iodine] Rash    History of Present Illness    Amber [name] is a 60 y.o. female with a hx of CAD s/p anterior MI 2001 with coronary stenting, previous tobacco use, hyperlipidemia, hypertension, hypothyroidism, OSA on CPAP last seen 01/14/21 by Dr. Irish Lack.  Prior anterior Mi in 2001. She had initial hip replacement in 2013 and second hip replacement in 2016. She stopped smoking  February 2018. She was last seen 12/2020 by Dr. Irish Lack and doing well from cardiac perspective. She had recent dental visit with x-ray concerning for carotid plaque. She had carotid duplex 07/12/21 with minimal amount of bilateral atherosclerotic plaque but no significant stenosis bilaterally.   Presents today for follow up. Very pleasant lady who enjoys working at [employer]. Recent testing reviewed in detail. Discussed optimization of lipid control to prevent progression of carotid artery atherosclerosis. Reports no shortness of breath nor dyspnea on exertion. Reports no chest pain, pressure, or tightness. No edema, orthopnea, PND. Reports no  palpitations.  EKGs/Labs/Other Studies Reviewed:   The following studies were reviewed today:  Carotid u/s 07/12/21 IMPRESSION: Minimal amount of bilateral intimal thickening/atherosclerotic plaque, left greater than right, not resulting in a hemodynamically significant stenosis within either internal carotid artery.  EKG:  EKG ordered today. EKG performed today demonstrates sinus bradycardia 52 bpm with no acute ST/T wave changes.   Recent Labs: No results found for requested labs within last 8760 hours.  Recent Lipid Panel    Component Value Date/Time   CHOL 115 01/04/2018 0754   TRIG 68 01/04/2018 0754   HDL 47 01/04/2018 0754   CHOLHDL 2.4 01/04/2018 0754   CHOLHDL 2 11/02/2014 0806   VLDL 16.4 11/02/2014 0806   LDLCALC 54 01/04/2018 0754    Home Medications   Current Meds  Medication Sig   amoxicillin (AMOXIL) 500 MG capsule Take 500 mg by mouth as directed. Takes for dental cleaning or any type of procedures may have   aspirin EC 81 MG tablet Take 1 tablet (81 mg total) by mouth daily.   atenolol (TENORMIN) 25 MG tablet Take 1 tablet (25 mg total) by mouth daily.   BIOTIN PO Take 2 tablets by mouth daily.   cholecalciferol (VITAMIN D) 1000 units tablet Take 1,000 Units by mouth daily.    Coenzyme Q10 (COQ10 PO) Take 1 tablet by mouth daily.   cyanocobalamin (,VITAMIN B-12,) 1000 MCG/ML injection Inject 1,000 mcg into the muscle every 30 (thirty) days. Reported on 02/11/2016   ezetimibe (ZETIA) 10 MG tablet Take 1 tablet (10 mg total) by mouth daily.   gabapentin (NEURONTIN) 300 MG capsule Take 1 capsule by mouth at bedtime   IRON PO Take 1 tablet by mouth at bedtime as needed. When feeling tired   levothyroxine (SYNTHROID) 88 MCG tablet Take 88 mcg by mouth daily before breakfast.   lisinopril (ZESTRIL) 5 MG tablet Take 1 tablet (5 mg total) by mouth daily.   metFORMIN (GLUCOPHAGE) 500 MG tablet Take 1,000 mg by mouth 2 (two) times daily with a meal.   mometasone  (NASONEX) 50 MCG/ACT nasal spray Place 2 sprays into the nose daily as needed. For allergies   nitroGLYCERIN (NITROSTAT) 0.4 MG SL tablet Place 0.4 mg under the tongue every 5 (five) minutes as needed for chest pain. Reported on 02/11/2016   Omega-3 Fatty Acids (FISH OIL PO) Take 1 capsule by mouth daily.   rosuvastatin (CRESTOR) 20 MG tablet Take 1 tablet (20 mg total) by mouth every other day.   triamcinolone (KENALOG) 0.1 % paste Use as directed 1 application in the mouth or throat as needed (FOR GUMS).    valACYclovir (VALTREX) 500 MG tablet Take 500 mg by mouth daily as needed (Cold sore). Reported on 02/11/2016   zolpidem (AMBIEN) 10 MG tablet Take 10 mg by mouth at bedtime as needed for sleep. Reported on 02/11/2016     Review of Systems  All other systems reviewed and are otherwise negative except as noted above.  Physical Exam    VS:  BP 120/74   Pulse (!) 52   Ht 5\' 5"  (1.651 m)   Wt 149 lb (67.6 kg)   BMI 24.79 kg/m  , BMI Body mass index is 24.79 kg/m.  Wt Readings from Last 3 Encounters:  07/23/21 149 lb (67.6 kg)  01/31/21 151 lb (68.5 kg)  01/14/21 153 lb 6.4 oz (69.6 kg)    GEN: Well nourished, well developed, in no acute distress. HEENT: normal. Neck: Supple, no JVD, carotid bruits, or masses. Cardiac: RRR, no murmurs, rubs, or gallops. No clubbing, cyanosis, edema.  Radials/PT 2+ and equal bilaterally.  Respiratory:  Respirations regular and unlabored, clear to auscultation bilaterally. GI: Soft, nontender, nondistended. MS: No deformity or atrophy. Skin: Warm and dry, no rash. Neuro:  Strength and sensation are intact. Psych: Normal affect.  Assessment & Plan    Carotid artery atherosclerosis - Carotid duplex 07/2021 with mild atherosclerosis and no stenosis. Continue aspirin, rosuvastatin, zetia. Her most recent labs 04/2021 showed LDL 60 which is at goal of <70. Discussed secondary prevention of atherosclerosis through lipid management. No amaurosis  fugax, syncope. Per patient preference will repeat carotid duplex in 1 year for monitoring.   CAD - Stable with no anginal symptoms. No indication for ischemic evaluation.  GDMT includes aspirin, atenolol, zetia, rosuvastatin, PRN nitroglycerin. Heart healthy diet and regular cardiovascular exercise encouraged.    HTN - BP well controlled. Continue current antihypertensive regimen of Lisinopril 5mg  QD, Atenolol 25mg  QD.   DM2 - Continue to follow with PCP.   HLD - 04/2021 total cholesterol 130, LDL 60, triglycerides 74, HDL 54. Continue Rosuvastatin 20mg  QD and Zetia 10mg  QD. Denies myalgias.   Disposition: Follow up  12/2021  with Larae Grooms, MD or APP.  Signed, Loel Dubonnet, NP 07/23/2021, 9:23 AM [place]

## 2021-08-30 ENCOUNTER — Other Ambulatory Visit: Payer: Self-pay | Admitting: Family Medicine

## 2021-08-30 DIAGNOSIS — Z1231 Encounter for screening mammogram for malignant neoplasm of breast: Secondary | ICD-10-CM

## 2021-10-09 ENCOUNTER — Ambulatory Visit
Admission: RE | Admit: 2021-10-09 | Discharge: 2021-10-09 | Disposition: A | Discharge status: Home / Self Care | Payer: BC Managed Care – PPO | Source: Ambulatory Visit | Attending: Family Medicine | Admitting: Family Medicine

## 2021-10-09 DIAGNOSIS — Z1231 Encounter for screening mammogram for malignant neoplasm of breast: Secondary | ICD-10-CM

## 2021-10-17 ENCOUNTER — Ambulatory Visit: Payer: BC Managed Care – PPO | Admitting: Podiatry

## 2021-10-21 ENCOUNTER — Encounter: Payer: Self-pay | Admitting: Podiatry

## 2021-10-21 ENCOUNTER — Ambulatory Visit: Payer: BC Managed Care – PPO | Admitting: Podiatry

## 2021-10-21 ENCOUNTER — Other Ambulatory Visit: Payer: Self-pay

## 2021-10-21 DIAGNOSIS — L600 Ingrowing nail: Secondary | ICD-10-CM

## 2021-10-21 NOTE — Patient Instructions (Signed)

## 2021-10-23 NOTE — Progress Notes (Signed)
Subjective:   Patient ID: Amber [name], female   DOB: 61 y.o.   MRN: [id number]   HPI Patient presents stating that she has had chronic ingrown toenails of both her big toes and tried to trim them tried to soak them and knows that she needs to get them faxed   ROS      Objective:  Physical Exam  Neurovascular status intact with incurvated medial borders of the hallux bilateral that are sore when pressed no erythema no edema with obvious structural changes of the nailbeds     Assessment:  Chronic ingrown toenail deformity hallux bilateral     Plan:  Recommended correction and explained procedure risk to patient patient wants surgery and today I infiltrated the hallux bilateral 60 mg like Marcaine mixture sterile prep done and using sterile instrumentation remove the borders exposed matrix applied phenol 3 applications 30 seconds followed by alcohol lavage sterile dressing gave instructions on soaks and leave dressings on 24 hours but take them off earlier if throbbing were to occur and encouraged her to call with questions concerns

## 2021-10-27 ENCOUNTER — Other Ambulatory Visit: Payer: Self-pay | Admitting: Neurology

## 2021-10-28 ENCOUNTER — Other Ambulatory Visit: Payer: Self-pay | Admitting: Family Medicine

## 2021-10-28 DIAGNOSIS — E2839 Other primary ovarian failure: Secondary | ICD-10-CM

## 2022-01-07 ENCOUNTER — Telehealth: Payer: Self-pay | Admitting: Interventional Cardiology

## 2022-01-07 NOTE — Telephone Encounter (Signed)
Pt c/o Shortness Of Breath: STAT if SOB developed within the last 24 hours or pt is noticeably SOB on the phone ? ?1. Are you currently SOB (can you hear that pt is SOB on the phone)? No ? ?2. How long have you been experiencing SOB? About a month, getting worse ? ?3. Are you SOB when sitting or when up moving around?  When moving around ? ? ?4.  Are you currently experiencing any other symptoms? No- patient wanted an appointment- I made an appointment for Monday- please call to evaluate ?

## 2022-01-07 NOTE — Telephone Encounter (Signed)
I spoke with patient. She reports shortness of breath for the last month or so. Describes as sporadic.  Occurs with exertion such as mowing her yard or moving heavy boxes.  No cough or sinus/cold symptoms.  No chest pain.  Patient has appointment on 5/15 with Fabian Sharp, PA.  ED precautions reviewed with patient.  ?

## 2022-01-09 ENCOUNTER — Other Ambulatory Visit: Payer: Self-pay | Admitting: Neurology

## 2022-01-10 NOTE — Telephone Encounter (Signed)
Enough given until 01/31/2022 with Dr.Jaffe appt ?

## 2022-01-12 NOTE — Progress Notes (Signed)
?Cardiology Office Note:   ? ?Date:  01/13/2022  ? ?ID:  Amber [name], DOB Jun 26, 1961, MRN [id number] ? ?PCP:  [name], MD ?  ?[facility] Providers ?Cardiologist:  Larae Grooms, MD    ? ?Referring MD: [name], MD  ? ?Chief Complaint  ?Patient presents with  ? Follow-up  ?  Dyspnea on exertion  ? ? ?History of Present Illness:   ? ?Amber [name] is a 61 y.o. female with a hx of CAD s/p anterior MI 2001 with stenting, previous tobacco use, hyperlipidemia, hypertension, hypothyroidism, OSA on CPAP, and DM.  Carotid duplex 07/12/2021 with minimal bilateral atherosclerotic plaque but no significant stenosis.  She stopped smoking in February 2018.  She was last seen in clinic by Laurann Montana, NP 07/23/2021 and was doing well at that time. ? ?She called our office reporting dyspnea on exertion and was added to my schedule.  She presents today alone. She reports new DOE and heart pounding with exertion that started about 1 month ago. No chest pain, orthopnea, PND, lower extremity swelling,  ?She is concerned about ischemia. She walks daily, but has a sedentary job at [employer].  ? ? ?Past Medical History:  ?Diagnosis Date  ? Allergic rhinitis   ? Anginal pain ([place]) 2001  ? Breast cancer ([place])   ? invasive ductal carcinoma right breast; ER, PR + and HER 2 -  ? Chronic neck pain   ? hx of  ? Diabetes mellitus without complication ([place])   ? DJD (degenerative joint disease) of hip   ? bil hip  ? GERD (gastroesophageal reflux disease)   ? hx of   ? Heart attack ([place]) 2001  ? Dr. Scarlette Calico  ? History of radiation therapy 09/16/12- 10/19/12  ? right breast  ? Hyperlipidemia   ? Hypertension   ? Dr. [facility]  ? Hypothyroidism   ? Migraines   ? "last one was 1990's" (08/17/2012)  ? Neuromuscular disorder ([place])   ? carpal tunnel right hand  ? Osteoporosis   ? Pernicious anemia   ? Sickle cell trait ([place])   ? Sleep apnea   ? does use a cpap  ? Stroke [facility])   ?  Tobacco abuse   ? Wears glasses   ? ? ?Past Surgical History:  ?Procedure Laterality Date  ? BREAST BIOPSY  06/2012  ? "right" (08/17/2012)  ? COLONOSCOPY    ? CORONARY ANGIOPLASTY WITH STENT PLACEMENT  2001  ? "1" (08/17/2012)  ? DORSAL COMPARTMENT RELEASE Left 08/18/2014  ? Procedure: LEFT FIRST RELEASE DORSAL COMPARTMENT ([place]);  Surgeon: Hessie Dibble, MD;  Location: [place];  Service: Orthopedics;  Laterality: Left;  ? NASAL SINUS SURGERY  1990  ? PARTIAL MASTECTOMY WITH NEEDLE LOCALIZATION AND AXILLARY SENTINEL LYMPH NODE BX  07/15/2012  ? Procedure: PARTIAL MASTECTOMY WITH NEEDLE LOCALIZATION AND AXILLARY SENTINEL LYMPH NODE BX;  Surgeon: Adin Hector, MD;  Location: [place];  Service: General;  Laterality: Right;  ? TOTAL HIP ARTHROPLASTY  08/17/2012  ? "right" (08/17/2012)  ? TOTAL HIP ARTHROPLASTY  08/17/2012  ? Procedure: TOTAL HIP ARTHROPLASTY ANTERIOR APPROACH;  Surgeon: Hessie Dibble, MD;  Location: [place];  Service: Orthopedics;  Laterality: Right;  ? TOTAL HIP ARTHROPLASTY Left 08/21/2015  ? TOTAL HIP ARTHROPLASTY Left 08/21/2015  ? Procedure: TOTAL HIP ARTHROPLASTY ANTERIOR APPROACH AND RIGHT TRIGGER THUMB RELEASE;  Surgeon: Melrose Nakayama, MD;  Location: [place];  Service: Orthopedics;  Laterality: Left;  ? ? ?Current Medications: ?Current Meds  ?Medication Sig  ? amoxicillin (AMOXIL) 500 MG capsule Take 500 mg by mouth as directed. Takes for dental cleaning or any type of procedures may have  ? aspirin EC 81 MG tablet Take 1 tablet (81 mg total) by mouth daily.  ? atenolol (TENORMIN) 25 MG tablet Take 1 tablet (25 mg total) by mouth daily.  ? BIOTIN PO Take 2 tablets by mouth daily.  ? cholecalciferol (VITAMIN D) 1000 units tablet Take 1,000 Units by mouth daily.   ? Coenzyme Q10 (COQ10 PO) Take 1 tablet by mouth daily.  ? cyanocobalamin (,VITAMIN B-12,) 1000 MCG/ML injection Inject 1,000 mcg into the muscle every 30 (thirty) days. Reported on 02/11/2016  ? ezetimibe  (ZETIA) 10 MG tablet Take 1 tablet (10 mg total) by mouth daily.  ? gabapentin (NEURONTIN) 300 MG capsule Take 1 capsule by mouth at bedtime  ? IRON PO Take 1 tablet by mouth at bedtime as needed. When feeling tired  ? levothyroxine (SYNTHROID) 88 MCG tablet Take 88 mcg by mouth daily before breakfast.  ? lisinopril (ZESTRIL) 5 MG tablet Take 1 tablet (5 mg total) by mouth daily.  ? metFORMIN (GLUCOPHAGE) 500 MG tablet Take 1,000 mg by mouth 2 (two) times daily with a meal.  ? mometasone (NASONEX) 50 MCG/ACT nasal spray Place 2 sprays into the nose daily as needed. For allergies  ? nitroGLYCERIN (NITROSTAT) 0.4 MG SL tablet Place 0.4 mg under the tongue every 5 (five) minutes as needed for chest pain. Reported on 02/11/2016  ? Omega-3 Fatty Acids (FISH OIL PO) Take 1 capsule by mouth daily.  ? rosuvastatin (CRESTOR) 20 MG tablet Take 1 tablet (20 mg total) by mouth every other day.  ? triamcinolone (KENALOG) 0.1 % paste Use as directed 1 application in the mouth or throat as needed (FOR GUMS).   ? valACYclovir (VALTREX) 500 MG tablet Take 500 mg by mouth daily as needed (Cold sore). Reported on 02/11/2016  ? zolpidem (AMBIEN) 10 MG tablet Take 10 mg by mouth at bedtime as needed for sleep. Reported on 02/11/2016  ?  ? ?Allergies:   Tramadol, Benzoyl peroxide, and Betadine [povidone iodine]  ? ?Social History  ? ?Socioeconomic History  ? Marital status: Single  ?  Spouse name: Not on file  ? Number of children: 0  ? Years of education: Not on file  ? Highest education level: Not on file  ?Occupational History  ? Not on file  ?Tobacco Use  ? Smoking status: Former  ?  Packs/day: 1.00  ?  Years: 35.00  ?  Pack years: 35.00  ?  Types: Cigarettes  ? Smokeless tobacco: Never  ?Substance and Sexual Activity  ? Alcohol use: Yes  ?  Comment: 08/17/2012 "couple mixed drinks; couple times/month; sometimes it's beer"  ? Drug use: No  ? Sexual activity: Not Currently  ?Other Topics Concern  ? Not on file  ?Social History Narrative   ? Right handed  ? 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 story 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  ? Insurance underwriter  ? ?Social Determinants of Health  ? ?Financial Resource Strain: Not on file  ?Food Insecurity: Not on file  ?Transportation Needs: Not on file  ?Physical Activity: Not on file  ?Stress: Not on file  ?Social Connections: Not on file  ?  ? ?Family History: ?The patient's family history includes Cancer in her cousin and maternal grandmother; Diabetes in her mother; Heart disease in her mother;  Parkinson's disease in her maternal grandfather. ? ?ROS:   ?Please see the history of present illness.    ? All other systems reviewed and are negative. ? ?EKGs/Labs/Other Studies Reviewed:   ? ?The following studies were reviewed today: ? ?Carotid u/s 07/12/21 ?IMPRESSION: ?Minimal amount of bilateral intimal thickening/atherosclerotic ?plaque, left greater than right, not resulting in a hemodynamically ?significant stenosis within either internal carotid artery. ? ?EKG:  EKG is  ordered today.  The ekg ordered today demonstrates sinus bradycardia with HR 44 ? ?Recent Labs: ?No results found for requested labs within last 8760 hours.  ?Recent Lipid Panel ?   ?Component Value Date/Time  ? CHOL 115 01/04/2018 0754  ? TRIG 68 01/04/2018 0754  ? HDL 47 01/04/2018 0754  ? CHOLHDL 2.4 01/04/2018 0754  ? CHOLHDL 2 11/02/2014 0806  ? VLDL 16.4 11/02/2014 0806  ? [place] 54 01/04/2018 0754  ? ? ? ?Risk Assessment/Calculations:   ?  ? ?    ? ?Physical Exam:   ? ?VS:  BP 110/70   Pulse (!) 44   Ht '5\' 5"'$  (1.651 m)   Wt 149 lb 12.8 oz (67.9 kg)   SpO2 98%   BMI 24.93 kg/m?    ? ?Wt Readings from Last 3 Encounters:  ?01/13/22 149 lb 12.8 oz (67.9 kg)  ?07/23/21 149 lb (67.6 kg)  ?01/31/21 151 lb (68.5 kg)  ?  ? ?GEN:  Well nourished, well developed in no acute distress ?HEENT: Normal ?NECK: No JVD; No carotid bruits ?LYMPHATICS: No lymphadenopathy ?CARDIAC: regular rhythm, bradycardic rate, no murmur ?RESPIRATORY:  Clear to auscultation without rales,  wheezing or rhonchi  ?ABDOMEN: Soft, non-tender, non-distended ?MUSCULOSKELETAL:  No edema; No deformity  ?SKIN: Warm and dry ?NEUROLOGIC:  Alert and oriented x 3 ?PSYCHIATRIC:  Normal affect  ? ?ASSESSMENT:

## 2022-01-13 ENCOUNTER — Encounter: Payer: Self-pay | Admitting: Physician Assistant

## 2022-01-13 ENCOUNTER — Ambulatory Visit: Payer: BC Managed Care – PPO | Admitting: Physician Assistant

## 2022-01-13 ENCOUNTER — Telehealth: Payer: Self-pay

## 2022-01-13 VITALS — BP 110/70 | HR 44 | Ht 65.0 in | Wt 149.8 lb

## 2022-01-13 DIAGNOSIS — R079 Chest pain, unspecified: Secondary | ICD-10-CM

## 2022-01-13 DIAGNOSIS — R001 Bradycardia, unspecified: Secondary | ICD-10-CM

## 2022-01-13 DIAGNOSIS — E118 Type 2 diabetes mellitus with unspecified complications: Secondary | ICD-10-CM

## 2022-01-13 DIAGNOSIS — E785 Hyperlipidemia, unspecified: Secondary | ICD-10-CM

## 2022-01-13 DIAGNOSIS — R0602 Shortness of breath: Secondary | ICD-10-CM | POA: Diagnosis not present

## 2022-01-13 DIAGNOSIS — I10 Essential (primary) hypertension: Secondary | ICD-10-CM

## 2022-01-13 DIAGNOSIS — I6523 Occlusion and stenosis of bilateral carotid arteries: Secondary | ICD-10-CM | POA: Diagnosis not present

## 2022-01-13 DIAGNOSIS — I2510 Atherosclerotic heart disease of native coronary artery without angina pectoris: Secondary | ICD-10-CM

## 2022-01-13 NOTE — Patient Instructions (Addendum)
Medication Instructions:  ?Atenolol 12.5 mg daily as directed  ? ?*If you need a refill on your cardiac medications before your next appointment, please call your pharmacy* ? ? ?Lab Work: ?NONE ordered at this time of appointment  ? ?If you have labs (blood work) drawn today and your tests are completely normal, you will receive your results only by: ?MyChart Message (if you have MyChart) OR ?A paper copy in the mail ?If you have any lab test that is abnormal or we need to change your treatment, we will call you to review the results. ? ? ?Testing/Procedures: ?Your physician has requested that you have an echocardiogram. Echocardiography is a painless test that uses sound waves to create images of your heart. It provides your doctor with information about the size and shape of your heart and how well your heart?s chambers and valves are working. This procedure takes approximately one hour. There are no restrictions for this procedure.  ? ? ?Follow-Up: ?At [facility], you and your health needs are our priority.  As part of our continuing mission to provide you with exceptional heart care, we have created designated Provider Care Teams.  These Care Teams include your primary Cardiologist (physician) and Advanced Practice Providers (APPs -  Physician Assistants and Nurse Practitioners) who all work together to provide you with the care you need, when you need it. ? ?We recommend signing up for the patient portal called "MyChart".  Sign up information is provided on this After Visit Summary.  MyChart is used to connect with patients for Virtual Visits (Telemedicine).  Patients are able to view lab/test results, encounter notes, upcoming appointments, etc.  Non-urgent messages can be sent to your provider as well.   ?To learn more about what you can do with MyChart, go to [email or web address].   ? ?Your next appointment:   ?3 month(s) ? ?The format for your next appointment:   ?In Person ? ?Provider:   ?Larae Grooms, MD   ? ? ?Other Instructions ? ? ?Important Information About Sugar ? ? ? ? ? ? ?

## 2022-01-13 NOTE — Telephone Encounter (Signed)
Lmom, to discuss Decreasing Atenolol 25 mg daily to Atenolol 12.5 mg daily per Fabian Sharp, PA. Pt was seen in out office this morning 01/13/2022.  ? ?Pt returned call and is in agreement with decreasing the Atenolol 12.5 mg. Pt will call back with any questions or concerns.  ?

## 2022-01-23 ENCOUNTER — Other Ambulatory Visit: Payer: Self-pay | Admitting: Neurology

## 2022-01-30 NOTE — Progress Notes (Unsigned)
NEUROLOGY FOLLOW UP OFFICE NOTE  Amber [name] [id number]  Assessment/Plan:    Diabetic polyneuropathy   1.  Gabapentin '300mg'$  at bedtime 2.  Follow up one year   Subjective:  Amber [name] is a 61 year old right-handed woman with CAD, type 2 diabetes mellitus, HTN, hyperlipidemia, tobacco use disorder and history of stroke and breast cancer (no chemotherapy) who follows up for diabetic polyneuropathy   UPDATE: Current medication:  Gabapentin '300mg'$  at bedtime She is doing well.  Pain is controlled.    HISTORY: For several years, she experienced intermittent discomfort (tingling/burning) in feet and legs, particularly at night.  She was diagnosed with diabetes in 2019.  Since then, she has been having neuropathy (right foot worse than left foot).  It feels like pins and needles in the feet as well as burning.  Big toe on the right feels tender.  Notes some numbness.  No falls.  She feels that the symptoms are unchanged.     Her blood sugars have been good.  Typically morning readings are less than 130 and second reading 150.  Occasionally in the 170s.  Hgb A1c from 09/09/18 was 6.3.  She also has history of B12 deficiency/pernicious anemia for which she takes injections and over the counter pills.  Level from 09/09/18 was >1525.   She was told to stop the oral pills and symptoms worsened.  She has history of hypothyroidism well controlled.  TSH from 09/09/18 was 2.76.    PAST MEDICAL HISTORY: Past Medical History:  Diagnosis Date   Allergic rhinitis    Anginal pain ([place]) 2001   Breast cancer ([place])    invasive ductal carcinoma right breast; ER, PR + and HER 2 -   Chronic neck pain    hx of   Diabetes mellitus without complication (HCC)    DJD (degenerative joint disease) of hip    bil hip   GERD (gastroesophageal reflux disease)    hx of    Heart attack [facility]) 2001   Dr. Scarlette Calico   History of radiation therapy 09/16/12- 10/19/12   right breast   Hyperlipidemia    Hypertension     [facility]. [facility]   Hypothyroidism    Migraines    "last one was 22's" (08/17/2012)   Neuromuscular disorder ([place])    carpal tunnel right hand   Osteoporosis    Pernicious anemia    Sickle cell trait ([place])    Sleep apnea    does use a cpap   Stroke ([place])    Tobacco abuse    Wears glasses     MEDICATIONS: Current Outpatient Medications on File Prior to Visit  Medication Sig Dispense Refill   amoxicillin (AMOXIL) 500 MG capsule Take 500 mg by mouth as directed. Takes for dental cleaning or any type of procedures may have     aspirin EC 81 MG tablet Take 1 tablet (81 mg total) by mouth daily. 90 tablet 3   atenolol (TENORMIN) 25 MG tablet Take 1 tablet (25 mg total) by mouth daily. (Patient taking differently: Take 12.5 mg by mouth daily.) 90 tablet 3   BIOTIN PO Take 2 tablets by mouth daily.     cholecalciferol (VITAMIN D) 1000 units tablet Take 1,000 Units by mouth daily.      Coenzyme Q10 (COQ10 PO) Take 1 tablet by mouth daily.     cyanocobalamin (,VITAMIN B-12,) 1000 MCG/ML injection Inject 1,000 mcg into the muscle every 30 (thirty) days. Reported  on 02/11/2016     ezetimibe (ZETIA) 10 MG tablet Take 1 tablet (10 mg total) by mouth daily. 90 tablet 3   gabapentin (NEURONTIN) 300 MG capsule Take 1 capsule by mouth at bedtime 11 capsule 0   IRON PO Take 1 tablet by mouth at bedtime as needed. When feeling tired     levothyroxine (SYNTHROID) 88 MCG tablet Take 88 mcg by mouth daily before breakfast.     lisinopril (ZESTRIL) 5 MG tablet Take 1 tablet (5 mg total) by mouth daily. 90 tablet 3   metFORMIN (GLUCOPHAGE) 500 MG tablet Take 1,000 mg by mouth 2 (two) times daily with a meal.     mometasone (NASONEX) 50 MCG/ACT nasal spray Place 2 sprays into the nose daily as needed. For allergies     nitroGLYCERIN (NITROSTAT) 0.4 MG SL tablet Place 0.4 mg under the tongue every 5 (five) minutes as needed for chest pain. Reported on 02/11/2016     Omega-3 Fatty  Acids (FISH OIL PO) Take 1 capsule by mouth daily.     rosuvastatin (CRESTOR) 20 MG tablet Take 1 tablet (20 mg total) by mouth every other day. 45 tablet 3   triamcinolone (KENALOG) 0.1 % paste Use as directed 1 application in the mouth or throat as needed (FOR GUMS).      valACYclovir (VALTREX) 500 MG tablet Take 500 mg by mouth daily as needed (Cold sore). Reported on 02/11/2016     zolpidem (AMBIEN) 10 MG tablet Take 10 mg by mouth at bedtime as needed for sleep. Reported on 02/11/2016     No current facility-administered medications on file prior to visit.    ALLERGIES: Allergies  Allergen Reactions   Tramadol Nausea And Vomiting    "couldn't stop vomiting" (08/17/2012)   Benzoyl Peroxide Itching and Rash   Betadine [Povidone Iodine] Rash    FAMILY HISTORY: Family History  Problem Relation Age of Onset   Diabetes Mother    Heart disease Mother    Cancer Maternal Grandmother        pancreatic   Cancer Cousin        pancreatic   Parkinson's disease Maternal Grandfather       Objective:  Blood pressure (!) 119/54, pulse (!) 49, height '5\' 4"'$  (1.626 m), weight 150 lb 12.8 oz (68.4 kg), SpO2 99 %. General: No acute distress.  Patient appears well-groomed.   Head:  Normocephalic/atraumatic Eyes:  Fundi examined but not visualized Heart:  Regular rate and rhythm Neurological Exam: alert and oriented to person, place, and time.  Speech fluent and not dysarthric, language intact.  CN II-XII intact. Bulk and tone normal, muscle strength 5/5 throughout.  Sensation to pinprick and vibration intact.  Deep tendon reflexes 2+ throughout, toes downgoing.  Finger to nose testing intact.  Gait normal, Romberg negative.   Metta Clines, DO  CC: [name], MD

## 2022-01-31 ENCOUNTER — Encounter: Payer: Self-pay | Admitting: Neurology

## 2022-01-31 ENCOUNTER — Ambulatory Visit: Payer: BC Managed Care – PPO | Admitting: Neurology

## 2022-01-31 VITALS — BP 119/54 | HR 49 | Ht 64.0 in | Wt 150.8 lb

## 2022-01-31 DIAGNOSIS — E1142 Type 2 diabetes mellitus with diabetic polyneuropathy: Secondary | ICD-10-CM

## 2022-01-31 MED ORDER — GABAPENTIN 300 MG PO CAPS: 300.0000 mg | ORAL_CAPSULE | Freq: Every day | ORAL | 3 refills | Status: DC

## 2022-01-31 NOTE — Patient Instructions (Signed)
Gabapentin '300mg'$  at bedtime.   Follow up in one year

## 2022-02-03 ENCOUNTER — Ambulatory Visit (HOSPITAL_COMMUNITY): Payer: BC Managed Care – PPO | Attending: Cardiovascular Disease

## 2022-02-03 DIAGNOSIS — R0602 Shortness of breath: Secondary | ICD-10-CM | POA: Insufficient documentation

## 2022-02-03 LAB — ECHOCARDIOGRAM COMPLETE
Area-P 1/2: 2.72 cm2
S' Lateral: 3.2 cm

## 2022-02-04 NOTE — Addendum Note (Signed)
Addended by: Minette Brine on: 02/04/2022 05:06 PM   Modules accepted: Orders

## 2022-02-05 ENCOUNTER — Encounter (HOSPITAL_COMMUNITY): Payer: Self-pay | Admitting: Physician Assistant

## 2022-02-06 ENCOUNTER — Telehealth (HOSPITAL_COMMUNITY): Payer: Self-pay

## 2022-02-06 NOTE — Telephone Encounter (Signed)
Detailed instructions left on the patient's answering machine. Asked to call back with any questions. S.Taiyana Kissler EMTP 

## 2022-02-11 ENCOUNTER — Ambulatory Visit (HOSPITAL_COMMUNITY): Payer: BC Managed Care – PPO | Attending: Cardiology

## 2022-02-11 DIAGNOSIS — R079 Chest pain, unspecified: Secondary | ICD-10-CM | POA: Diagnosis not present

## 2022-02-11 DIAGNOSIS — R0602 Shortness of breath: Secondary | ICD-10-CM | POA: Insufficient documentation

## 2022-02-11 DIAGNOSIS — E785 Hyperlipidemia, unspecified: Secondary | ICD-10-CM | POA: Diagnosis not present

## 2022-02-11 DIAGNOSIS — I2510 Atherosclerotic heart disease of native coronary artery without angina pectoris: Secondary | ICD-10-CM | POA: Diagnosis not present

## 2022-02-11 LAB — MYOCARDIAL PERFUSION IMAGING
LV dias vol: 52 mL (ref 46–106)
LV sys vol: 21 mL
Nuc Stress EF: 60 %
Peak HR: 100 {beats}/min
Rest HR: 55 {beats}/min
Rest Nuclear Isotope Dose: 10.1 mCi
SDS: 0
SRS: 0
SSS: 0
Stress Nuclear Isotope Dose: 31.7 mCi
TID: 1.02

## 2022-02-11 MED ORDER — TECHNETIUM TC 99M TETROFOSMIN IV KIT
10.1000 | PACK | Freq: Once | INTRAVENOUS | Status: AC | PRN
  Administered 2022-02-11: 10.1 via INTRAVENOUS

## 2022-02-11 MED ORDER — REGADENOSON 0.4 MG/5ML IV SOLN
0.4000 mg | Freq: Once | INTRAVENOUS | Status: AC
  Administered 2022-02-11: 0.4 mg via INTRAVENOUS

## 2022-02-11 MED ORDER — TECHNETIUM TC 99M TETROFOSMIN IV KIT
31.7000 | PACK | Freq: Once | INTRAVENOUS | Status: AC | PRN
  Administered 2022-02-11: 31.7 via INTRAVENOUS

## 2022-04-17 ENCOUNTER — Ambulatory Visit: Payer: BC Managed Care – PPO | Admitting: Interventional Cardiology

## 2022-04-17 ENCOUNTER — Encounter: Payer: Self-pay | Admitting: Interventional Cardiology

## 2022-04-17 VITALS — BP 130/80 | HR 61 | Ht 65.0 in | Wt 149.4 lb

## 2022-04-17 DIAGNOSIS — I2510 Atherosclerotic heart disease of native coronary artery without angina pectoris: Secondary | ICD-10-CM | POA: Diagnosis not present

## 2022-04-17 DIAGNOSIS — E782 Mixed hyperlipidemia: Secondary | ICD-10-CM

## 2022-04-17 DIAGNOSIS — I6523 Occlusion and stenosis of bilateral carotid arteries: Secondary | ICD-10-CM | POA: Diagnosis not present

## 2022-04-17 DIAGNOSIS — R7303 Prediabetes: Secondary | ICD-10-CM | POA: Diagnosis not present

## 2022-04-17 DIAGNOSIS — E118 Type 2 diabetes mellitus with unspecified complications: Secondary | ICD-10-CM | POA: Diagnosis not present

## 2022-04-17 DIAGNOSIS — Z720 Tobacco use: Secondary | ICD-10-CM

## 2022-04-17 DIAGNOSIS — R0602 Shortness of breath: Secondary | ICD-10-CM

## 2022-04-17 MED ORDER — LISINOPRIL 5 MG PO TABS: 5.0000 mg | ORAL_TABLET | Freq: Every day | ORAL | 3 refills | Status: AC

## 2022-04-17 MED ORDER — ROSUVASTATIN CALCIUM 20 MG PO TABS: 20.0000 mg | ORAL_TABLET | ORAL | 3 refills | Status: AC

## 2022-04-17 MED ORDER — NITROGLYCERIN 0.4 MG SL SUBL: 0.4000 mg | SUBLINGUAL_TABLET | SUBLINGUAL | 2 refills | Status: AC | PRN

## 2022-04-17 MED ORDER — EZETIMIBE 10 MG PO TABS: 10.0000 mg | ORAL_TABLET | Freq: Every day | ORAL | 3 refills | Status: AC

## 2022-04-17 MED ORDER — ATENOLOL 25 MG PO TABS: 25.0000 mg | ORAL_TABLET | Freq: Every day | ORAL | 3 refills | Status: DC

## 2022-04-17 NOTE — Progress Notes (Signed)
Cardiology Office Note   Date:  04/17/2022   ID:  Amber [name], DOB Jun 20, 1961, MRN [id number]  PCP:  [name], MD    No chief complaint on file.  CAD  Wt Readings from Last 3 Encounters:  04/17/22 149 lb 6.4 oz (67.8 kg)  02/11/22 150 lb (68 kg)  01/31/22 150 lb 12.8 oz (68.4 kg)       History of Present Illness: Amber [name] is a 61 y.o. female   Who had an anterior MI in 2001.  She had a stent placed by Dr. Leonia Reeves.  She had chest pain with that MI. Prior to the MI, her Mom had an MI which prompted a stress test in the patient which was normal.   First hip replacement in 2013.  She had second hip (left) replacement in 12/16. - Dr. Rhona Raider.   No heart issues at the time of the surgery.     At the time of her heart attack in 2001, she had nausea, diaphoresis and chest pain. SHe knew it was a heart attack.    No heart cath since then.   She has stopped smoking in Feb 2018.   She has been bradycardic in the past.   Had dyspnea on exertion back in May 2023.  Echocardiogram was done.  Normal LV/RV, normal valvular function.    Negative nuclear study as well.   Atenolol was cut to 12.5 mg daily due to sinus bradycardia.  Denies : Chest pain. Dizziness. Leg edema. Nitroglycerin use. Orthopnea. Palpitations. Paroxysmal nocturnal dyspnea. . Syncope.    Quit smoking 4 years ago.   Past Medical History:  Diagnosis Date   Allergic rhinitis    Anginal pain ([place]) 2001   Breast cancer ([place])    invasive ductal carcinoma right breast; ER, PR + and HER 2 -   Chronic neck pain    hx of   Diabetes mellitus without complication (HCC)    DJD (degenerative joint disease) of hip    bil hip   GERD (gastroesophageal reflux disease)    hx of    Heart attack [facility]) 2001   Dr. Scarlette Calico   History of radiation therapy 09/16/12- 10/19/12   right breast   Hyperlipidemia    Hypertension    Dr. [facility]   Hypothyroidism    Migraines    "last  one was 26's" (08/17/2012)   Neuromuscular disorder ([place])    carpal tunnel right hand   Osteoporosis    Pernicious anemia    Sickle cell trait ([place])    Sleep apnea    does use a cpap   Stroke ([place])    Tobacco abuse    Wears glasses     Past Surgical History:  Procedure Laterality Date   BREAST BIOPSY  06/2012   "right" (08/17/2012)   COLONOSCOPY     CORONARY ANGIOPLASTY WITH STENT PLACEMENT  2001   "1" (08/17/2012)   DORSAL COMPARTMENT RELEASE Left 08/18/2014   Procedure: LEFT FIRST RELEASE DORSAL COMPARTMENT ([place]);  Surgeon: Hessie Dibble, MD;  Location: [place] [place];  Service: Orthopedics;  Laterality: Left;   NASAL SINUS SURGERY  1990   PARTIAL MASTECTOMY WITH NEEDLE LOCALIZATION AND AXILLARY SENTINEL LYMPH NODE BX  07/15/2012   Procedure: PARTIAL MASTECTOMY WITH NEEDLE LOCALIZATION AND AXILLARY SENTINEL LYMPH NODE BX;  Surgeon: Adin Hector, MD;  Location: [place];  Service: General;  Laterality: Right;   TOTAL HIP ARTHROPLASTY  08/17/2012   "  right" (08/17/2012)   TOTAL HIP ARTHROPLASTY  08/17/2012   Procedure: TOTAL HIP ARTHROPLASTY ANTERIOR APPROACH;  Surgeon: Hessie Dibble, MD;  Location: [place];  Service: Orthopedics;  Laterality: Right;   TOTAL HIP ARTHROPLASTY Left 08/21/2015   TOTAL HIP ARTHROPLASTY Left 08/21/2015   Procedure: TOTAL HIP ARTHROPLASTY ANTERIOR APPROACH AND RIGHT TRIGGER THUMB RELEASE;  Surgeon: Melrose Nakayama, MD;  Location: [place];  Service: Orthopedics;  Laterality: Left;     Current Outpatient Medications  Medication Sig Dispense Refill   amoxicillin (AMOXIL) 500 MG capsule Take 500 mg by mouth as directed. Takes for dental cleaning or any type of procedures may have     aspirin EC 81 MG tablet Take 1 tablet (81 mg total) by mouth daily. 90 tablet 3   atenolol (TENORMIN) 25 MG tablet Take 1 tablet (25 mg total) by mouth daily. (Patient taking differently: Take 12.5 mg by mouth daily.) 90 tablet 3   BIOTIN PO Take 2  tablets by mouth daily.     cholecalciferol (VITAMIN D) 1000 units tablet Take 1,000 Units by mouth daily.      Coenzyme Q10 (COQ10 PO) Take 1 tablet by mouth daily.     cyanocobalamin (,VITAMIN B-12,) 1000 MCG/ML injection Inject 1,000 mcg into the muscle every 30 (thirty) days. Reported on 02/11/2016     ezetimibe (ZETIA) 10 MG tablet Take 1 tablet (10 mg total) by mouth daily. 90 tablet 3   gabapentin (NEURONTIN) 300 MG capsule Take 1 capsule (300 mg total) by mouth at bedtime. 90 capsule 3   IRON PO Take 1 tablet by mouth at bedtime as needed. When feeling tired     levothyroxine (SYNTHROID) 88 MCG tablet Take 88 mcg by mouth daily before breakfast.     lisinopril (ZESTRIL) 5 MG tablet Take 1 tablet (5 mg total) by mouth daily. 90 tablet 3   metFORMIN (GLUCOPHAGE) 500 MG tablet Take 1,000 mg by mouth 2 (two) times daily with a meal.     mometasone (NASONEX) 50 MCG/ACT nasal spray Place 2 sprays into the nose daily as needed. For allergies     nitroGLYCERIN (NITROSTAT) 0.4 MG SL tablet Place 0.4 mg under the tongue every 5 (five) minutes as needed for chest pain. Reported on 02/11/2016     Omega-3 Fatty Acids (FISH OIL PO) Take 1 capsule by mouth daily.     rosuvastatin (CRESTOR) 20 MG tablet Take 1 tablet (20 mg total) by mouth every other day. 45 tablet 3   triamcinolone (KENALOG) 0.1 % paste Use as directed 1 application in the mouth or throat as needed (FOR GUMS).      valACYclovir (VALTREX) 500 MG tablet Take 500 mg by mouth daily as needed (Cold sore). Reported on 02/11/2016     zolpidem (AMBIEN) 10 MG tablet Take 10 mg by mouth at bedtime as needed for sleep. Reported on 02/11/2016     No current facility-administered medications for this visit.    Allergies:   Tramadol, Benzoyl peroxide, and Betadine [povidone iodine]    Social History:  The patient  reports that she has quit smoking. Her smoking use included cigarettes. She has a 35.00 pack-year smoking history. She has never used  smokeless tobacco. She reports current alcohol use. She reports that she does not use drugs.   Family History:  The patient's family history includes Cancer in her cousin and maternal grandmother; Diabetes in her mother; Heart disease in her mother; Parkinson's disease in her maternal grandfather.  ROS:  Please see the history of present illness.   Otherwise, review of systems are positive for DOE.   All other systems are reviewed and negative.    PHYSICAL EXAM: VS:  BP 130/80   Pulse 61   Ht '5\' 5"'$  (1.651 m)   Wt 149 lb 6.4 oz (67.8 kg)   SpO2 97%   BMI 24.86 kg/m  , BMI Body mass index is 24.86 kg/m. GEN: Well nourished, well developed, in no acute distress HEENT: normal Neck: no JVD, carotid bruits, or masses Cardiac: RRR; no murmurs, rubs, or gallops,no edema  Respiratory:  clear to auscultation bilaterally, normal work of breathing GI: soft, nontender, nondistended, + BS MS: no deformity or atrophy Skin: warm and dry, no rash Neuro:  Strength and sensation are intact Psych: euthymic mood, full affect   EKG:   The ekg ordered in 5/23 demonstrates sinus bradycardia, no ST changes   Recent Labs: No results found for requested labs within last 365 days.   Lipid Panel    Component Value Date/Time   CHOL 115 01/04/2018 0754   TRIG 68 01/04/2018 0754   HDL 47 01/04/2018 0754   CHOLHDL 2.4 01/04/2018 0754   CHOLHDL 2 11/02/2014 0806   VLDL 16.4 11/02/2014 0806   LDLCALC 54 01/04/2018 0754     Other studies Reviewed: Additional studies/ records that were reviewed today with results demonstrating:labs reviewed .   ASSESSMENT AND PLAN:  CAD/Old MI/DOE: Negative stress test on normal echo in May 2023 .  check pulmonary function tests with DLCO to evaluate for pulmonary cause of the shortness of breath.  Chest x-ray ordered as well. .if pulmonary function tests are unremarkable, could consider heart catheterization since its been since 2001 since a catheterization has  been done Hyperlipidemia: LDL 60. COntinue rosuvastatin 20 mg daily.  HTN: The current medical regimen is effective;  continue present plan and medications. Tobacco abuse: quit smoking with Chantix.  DM: A1c of 7.1.  Whole food, plant-based diet.  High-fiber diet. Bradycardia: Improved with decreased atenolol.   Current medicines are reviewed at length with the patient today.  The patient concerns regarding her medicines were addressed.  The following changes have been made:  No change  Labs/ tests ordered today include: Chest x-ray, pulmonary function tests No orders of the defined types were placed in this encounter.   Recommend 150 minutes/week of aerobic exercise Low fat, low carb, high fiber diet recommended  Disposition:   FU in 1 year or sooner if sx persist.   Signed, Larae Grooms, MD  04/17/2022 5:00 PM    [address], [place], [place]  [address] Phone: [phone]; Fax: [phone]

## 2022-04-17 NOTE — Patient Instructions (Signed)
Medication Instructions:  Your physician recommends that you continue on your current medications as directed. Please refer to the Current Medication list given to you today.  *If you need a refill on your cardiac medications before your next appointment, please call your pharmacy*  Testing/Procedures: Chest xray at [address]  Pulmonary Function test at [facility] pulmonary- They will call to schedule   Follow-Up: At [facility], you and your health needs are our priority.  As part of our continuing mission to provide you with exceptional heart care, we have created designated Provider Care Teams.  These Care Teams include your primary Cardiologist (physician) and Advanced Practice Providers (APPs -  Physician Assistants and Nurse Practitioners) who all work together to provide you with the care you need, when you need it.  We recommend signing up for the patient portal called "MyChart".  Sign up information is provided on this After Visit Summary.  MyChart is used to connect with patients for Virtual Visits (Telemedicine).  Patients are able to view lab/test results, encounter notes, upcoming appointments, etc.  Non-urgent messages can be sent to your provider as well.   To learn more about what you can do with MyChart, go to [email or web address].    Your next appointment:   1 year(s)  The format for your next appointment:   In Person  Provider:   Larae Grooms, MD {

## 2022-04-18 NOTE — Addendum Note (Signed)
Addended by: Thora Lance on: 04/18/2022 08:50 AM   Modules accepted: Orders

## 2022-04-23 ENCOUNTER — Encounter: Payer: Self-pay | Admitting: Interventional Cardiology

## 2022-05-06 ENCOUNTER — Ambulatory Visit
Admission: RE | Admit: 2022-05-06 | Discharge: 2022-05-06 | Disposition: A | Discharge status: Home / Self Care | Payer: BC Managed Care – PPO | Source: Ambulatory Visit | Attending: Interventional Cardiology | Admitting: Interventional Cardiology

## 2022-05-06 DIAGNOSIS — R0602 Shortness of breath: Secondary | ICD-10-CM

## 2022-05-06 DIAGNOSIS — I2510 Atherosclerotic heart disease of native coronary artery without angina pectoris: Secondary | ICD-10-CM

## 2022-05-20 ENCOUNTER — Encounter: Payer: Self-pay | Admitting: Interventional Cardiology

## 2022-06-04 ENCOUNTER — Other Ambulatory Visit: Payer: Self-pay | Admitting: Family Medicine

## 2022-06-04 DIAGNOSIS — N6310 Unspecified lump in the right breast, unspecified quadrant: Secondary | ICD-10-CM

## 2022-06-04 DIAGNOSIS — E2839 Other primary ovarian failure: Secondary | ICD-10-CM

## 2022-06-10 ENCOUNTER — Other Ambulatory Visit: Payer: BC Managed Care – PPO

## 2022-06-17 ENCOUNTER — Ambulatory Visit
Admission: RE | Admit: 2022-06-17 | Discharge: 2022-06-17 | Disposition: A | Discharge status: Home / Self Care | Payer: BC Managed Care – PPO | Source: Ambulatory Visit | Attending: Family Medicine | Admitting: Family Medicine

## 2022-06-17 ENCOUNTER — Other Ambulatory Visit: Payer: Self-pay | Admitting: Family Medicine

## 2022-06-17 DIAGNOSIS — N6310 Unspecified lump in the right breast, unspecified quadrant: Secondary | ICD-10-CM

## 2022-06-17 DIAGNOSIS — Z1231 Encounter for screening mammogram for malignant neoplasm of breast: Secondary | ICD-10-CM

## 2022-06-26 ENCOUNTER — Encounter: Payer: BC Managed Care – PPO | Admitting: Internal Medicine

## 2022-06-26 DIAGNOSIS — R0602 Shortness of breath: Secondary | ICD-10-CM

## 2022-06-26 DIAGNOSIS — I2510 Atherosclerotic heart disease of native coronary artery without angina pectoris: Secondary | ICD-10-CM

## 2022-06-26 LAB — PULMONARY FUNCTION TEST
DL/VA % pred: 72 %
DL/VA: 3.05 ml/min/mmHg/L
DLCO cor % pred: 62 %
DLCO cor: 12.49 ml/min/mmHg
DLCO unc % pred: 62 %
DLCO unc: 12.49 ml/min/mmHg
FEF 25-75 Post: 0.91 L/sec
FEF 25-75 Pre: 1.06 L/sec
FEF2575-%Change-Post: -14 %
FEF2575-%Pred-Post: 41 %
FEF2575-%Pred-Pre: 47 %
FEV1-%Change-Post: -4 %
FEV1-%Pred-Post: 65 %
FEV1-%Pred-Pre: 68 %
FEV1-Post: 1.62 L
FEV1-Pre: 1.69 L
FEV1FVC-%Change-Post: -1 %
FEV1FVC-%Pred-Pre: 92 %
FEV6-%Change-Post: -2 %
FEV6-%Pred-Post: 73 %
FEV6-%Pred-Pre: 75 %
FEV6-Post: 2.29 L
FEV6-Pre: 2.35 L
FEV6FVC-%Change-Post: 0 %
FEV6FVC-%Pred-Post: 103 %
FEV6FVC-%Pred-Pre: 103 %
FVC-%Change-Post: -3 %
FVC-%Pred-Post: 70 %
FVC-%Pred-Pre: 73 %
FVC-Post: 2.29 L
FVC-Pre: 2.36 L
Post FEV1/FVC ratio: 71 %
Post FEV6/FVC ratio: 100 %
Pre FEV1/FVC ratio: 71 %
Pre FEV6/FVC Ratio: 100 %
RV % pred: 92 %
RV: 1.9 L
TLC % pred: 83 %
TLC: 4.24 L

## 2022-06-27 ENCOUNTER — Other Ambulatory Visit: Payer: Self-pay | Admitting: *Deleted

## 2022-06-27 DIAGNOSIS — J439 Emphysema, unspecified: Secondary | ICD-10-CM

## 2022-06-27 DIAGNOSIS — R0602 Shortness of breath: Secondary | ICD-10-CM

## 2022-07-17 ENCOUNTER — Ambulatory Visit: Payer: BC Managed Care – PPO | Admitting: Pulmonary Disease

## 2022-07-17 ENCOUNTER — Encounter: Payer: Self-pay | Admitting: Pulmonary Disease

## 2022-07-17 VITALS — BP 130/68 | HR 57 | Temp 98.1°F | Ht 63.0 in | Wt 149.6 lb

## 2022-07-17 DIAGNOSIS — R0602 Shortness of breath: Secondary | ICD-10-CM | POA: Diagnosis not present

## 2022-07-17 MED ORDER — ALBUTEROL SULFATE HFA 108 (90 BASE) MCG/ACT IN AERS: 2.0000 | INHALATION_SPRAY | RESPIRATORY_TRACT | 5 refills | Status: DC | PRN

## 2022-07-17 MED ORDER — ANORO ELLIPTA 62.5-25 MCG/ACT IN AEPB: 1.0000 | INHALATION_SPRAY | Freq: Every day | RESPIRATORY_TRACT | 5 refills | Status: DC

## 2022-07-17 NOTE — Progress Notes (Signed)
Amber [name]    [id number]    09-14-1960  Primary Care Physician:Smith, Hal Hope, MD  Referring Physician: Jettie Booze, MD [phone] [address] [place],  [place] [address]  Chief complaint: Patient being seen for shortness of breath  HPI:  Shortness of breath worsening over the last 3 to 4 months Does not recollect having any acute episodes of illness that led to worsening shortness of breath  Reformed smoker, quit in 2018 was smoking about a pack a day for about 40 years, 40-pack-year smoking history  She does have a history of coronary artery disease history of hypothyroidism, history of diabetes  She feels she may be able to walk a mile if she takes a time but usual chores does sometimes make her short of breath  Denies any cough Denies any wheezing Denies any shortness of breath at rest No chest pains or chest discomfort  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   amoxicillin (AMOXIL) 500 MG capsule Take 500 mg by mouth as directed. Takes for dental cleaning or any type of procedures may have   aspirin EC 81 MG tablet Take 1 tablet (81 mg total) by mouth daily.   atenolol (TENORMIN) 25 MG tablet Take 1 tablet (25 mg total) by mouth daily.   BIOTIN PO Take 2 tablets by mouth daily.   cholecalciferol (VITAMIN D) 1000 units tablet Take 1,000 Units by mouth daily.    clobetasol ointment (TEMOVATE) 0.05 % Apply topically.   Coenzyme Q10 (COQ10 PO) Take 1 tablet by  mouth daily.   cyanocobalamin (,VITAMIN B-12,) 1000 MCG/ML injection Inject 1,000 mcg into the muscle every 30 (thirty) days. Reported on 02/11/2016   ezetimibe (ZETIA) 10 MG tablet Take 1 tablet (10 mg total) by mouth daily.   gabapentin (NEURONTIN) 300 MG capsule Take 1 capsule (300 mg total) by mouth at bedtime.   IRON PO Take 1 tablet by mouth at bedtime as needed. When feeling tired   levothyroxine (SYNTHROID) 88 MCG tablet Take 88 mcg by mouth daily before breakfast.   lisinopril (ZESTRIL) 5 MG tablet Take 1 tablet (5 mg total) by mouth daily.   metFORMIN (GLUCOPHAGE) 500 MG tablet Take 1,000 mg by mouth 2 (two) times daily with a meal.   mometasone (NASONEX) 50 MCG/ACT nasal spray Place 2 sprays into the nose daily as needed. For allergies   nitroGLYCERIN (NITROSTAT) 0.4 MG SL tablet Place 1 tablet (0.4 mg total) under the tongue every 5 (five) minutes as needed for chest pain. Reported on 02/11/2016   Omega-3 Fatty Acids (FISH OIL PO) Take 1 capsule by mouth daily.   rosuvastatin (CRESTOR) 20 MG tablet Take 1 tablet (20 mg total) by mouth every other day.   sertraline (ZOLOFT) 50 MG tablet Take 75 mg by mouth daily.   SODIUM FLUORIDE 5000 SENSITIVE 1.1-5 % GEL Take by mouth as directed.   triamcinolone (KENALOG) 0.1 % paste Use as directed 1  application in the mouth or throat as needed (FOR GUMS).    umeclidinium-vilanterol (ANORO ELLIPTA) 62.5-25 MCG/ACT AEPB Inhale 1 puff into the lungs daily.   valACYclovir (VALTREX) 500 MG tablet Take 500 mg by mouth daily as needed (Cold sore). Reported on 02/11/2016   zolpidem (AMBIEN) 10 MG tablet Take 10 mg by mouth at bedtime as needed for sleep. Reported on 02/11/2016   No facility-administered encounter medications on file as of 07/17/2022.    Allergies as of 07/17/2022 - Review Complete 07/17/2022  Allergen Reaction Noted   Tramadol Nausea And Vomiting 06/22/2012   Benzoyl peroxide Itching and Rash 11/01/2013   Betadine [povidone iodine]  Rash 08/10/2012    Past Medical History:  Diagnosis Date   Allergic rhinitis    Anginal pain ([place]) 2001   Breast cancer ([facility])    invasive ductal carcinoma right breast; ER, PR + and HER 2 -   Chronic neck pain    hx of   Diabetes mellitus without complication (HCC)    DJD (degenerative joint disease) of hip    bil hip   GERD (gastroesophageal reflux disease)    hx of    Heart attack [facility]) 2001   Dr. Scarlette Calico   History of radiation therapy 09/16/12- 10/19/12   right breast   Hyperlipidemia    Hypertension    Dr. [facility]   Hypothyroidism    Migraines    "last one was 1990's" (08/17/2012)   Neuromuscular disorder ([facility])    carpal tunnel right hand   Osteoporosis    Pernicious anemia    Sickle cell trait ([place])    Sleep apnea    does use a cpap   Stroke ([facility])    Tobacco abuse    Wears glasses     Past Surgical History:  Procedure Laterality Date   BREAST BIOPSY  06/2012   "right" (08/17/2012)   COLONOSCOPY     CORONARY ANGIOPLASTY WITH STENT PLACEMENT  2001   "1" (08/17/2012)   DORSAL COMPARTMENT RELEASE Left 08/18/2014   Procedure: LEFT FIRST RELEASE DORSAL COMPARTMENT (DEQUERVAIN);  Surgeon: Hessie Dibble, MD;  Location: [facility];  Service: Orthopedics;  Laterality: Left;   NASAL SINUS SURGERY  1990   PARTIAL MASTECTOMY WITH NEEDLE LOCALIZATION AND AXILLARY SENTINEL LYMPH NODE BX  07/15/2012   Procedure: PARTIAL MASTECTOMY WITH NEEDLE LOCALIZATION AND AXILLARY SENTINEL LYMPH NODE BX;  Surgeon: Adin Hector, MD;  Location: [facility];  Service: General;  Laterality: Right;   TOTAL HIP ARTHROPLASTY  08/17/2012   "right" (08/17/2012)   TOTAL HIP ARTHROPLASTY  08/17/2012   Procedure: TOTAL HIP ARTHROPLASTY ANTERIOR APPROACH;  Surgeon: Hessie Dibble, MD;  Location: [facility];  Service: Orthopedics;  Laterality: Right;   TOTAL HIP ARTHROPLASTY Left 08/21/2015   TOTAL HIP ARTHROPLASTY Left 08/21/2015   Procedure: TOTAL HIP  ARTHROPLASTY ANTERIOR APPROACH AND RIGHT TRIGGER THUMB RELEASE;  Surgeon: Melrose Nakayama, MD;  Location: [facility];  Service: Orthopedics;  Laterality: Left;    Family History  Problem Relation Age of Onset   Diabetes Mother    Heart disease Mother    Cancer Maternal Grandmother        pancreatic   Cancer Cousin        pancreatic   Parkinson's disease Maternal Grandfather     Social History   Socioeconomic History   Marital status: Single    Spouse name: Not on file   Number of children: 0   Years  of education: Not on file   Highest education level: Not on file  Occupational History   Not on file  Tobacco Use   Smoking status: Former    Packs/day: 1.00    Years: 35.00    Total pack years: 35.00    Types: Cigarettes   Smokeless tobacco: Never  Substance and Sexual Activity   Alcohol use: Yes    Comment: 08/17/2012 "couple mixed drinks; couple times/month; sometimes it's beer"   Drug use: No   Sexual activity: Not Currently  Other Topics Concern   Not on file  Social History Narrative   Right handed   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 story home with [place]   Social Determinants of Health   Financial Resource Strain: Not on file  Food Insecurity: Not on file  Transportation Needs: Not on file  Physical Activity: Not on file  Stress: Not on file  Social Connections: Not on file  Intimate Partner Violence: Not on file    Review of Systems  Respiratory:  Positive for shortness of breath.     Vitals:   07/17/22 0859  BP: 130/68  Pulse: (!) 57  Temp: 98.1 F (36.7 C)  SpO2: 97%     Physical Exam Constitutional:      Appearance: Normal appearance.  HENT:     Head: Normocephalic.     Mouth/Throat:     Mouth: Mucous membranes are moist.  Cardiovascular:     Rate and Rhythm: Normal rate and regular rhythm.     Heart sounds: No murmur heard.    No friction rub.  Pulmonary:     Effort: No respiratory distress.     Breath sounds: No stridor. No  wheezing or rhonchi.  Musculoskeletal:     Cervical back: No rigidity or tenderness.  Neurological:     Mental Status: She is alert.  Psychiatric:        Mood and Affect: Mood normal.     Data Reviewed: Recent echocardiogram 11/03/2021-within normal limits with normal ejection fraction normal right-sided pressures  Chest x-ray 05/06/2022-no acute abnormality  Pulmonary function tests 06/26/2022-reviewed by myself with the patient shows mild obstructive disease with no significant bronchodilator response, no restriction, mild reduction in diffusing capacity  Assessment:  61 year old lady with worsening shortness of breath over the last few months, past history of smoking, history of heart disease but stable, history of diabetes-stable  .  Mild obstructive airway disease  .  With history of extensive smoking in the past, likely has mild COPD  .  Deconditioning from decreasing activity may also be playing a role    Plan/Recommendations:  Recommended graded exercise as tolerated  We will start Anoro to be used daily Start albuterol to be used as needed -Inhaler technique was reviewed with the patient  Encouraged to continue to stay active  Encouraged to give [place] a call with any significant concerns  Tentative follow-up in about 3 months  The importance of staying off cigarettes was discussed  Sherrilyn Rist MD [facility] 07/17/2022, 9:45 AM  CC: Jettie Booze, MD

## 2022-07-17 NOTE — Patient Instructions (Signed)
I will see you back in about 3 months  Graded exercises as tolerated  Inhalers as we reviewed in the office today will include a maintenance inhaler that you are going to use daily regardless of how you are feeling and a rescue inhaler to be used up to 4 times a day as needed  What will help you the best is graded exercises to improve activity levels  Call with significant concerns

## 2022-07-21 ENCOUNTER — Telehealth: Payer: Self-pay | Admitting: *Deleted

## 2022-07-21 NOTE — Telephone Encounter (Signed)
Patient called and states she would like to see if she can get a different prescription due to expense. Uses [facility] pharmacy in [place]   Please call [phone]

## 2022-07-21 NOTE — Telephone Encounter (Signed)
Called and spoke to patient and she states that the Anoro inhaler is too expensive for her. She states it is almost $90.   Can we run a ticket to see if anything is cheaper for her  Thank you   Please advise

## 2022-07-23 ENCOUNTER — Other Ambulatory Visit (HOSPITAL_COMMUNITY): Payer: Self-pay

## 2022-07-23 ENCOUNTER — Ambulatory Visit (HOSPITAL_COMMUNITY)
Admission: RE | Admit: 2022-07-23 | Discharge: 2022-07-23 | Disposition: A | Discharge status: Home / Self Care | Payer: BC Managed Care – PPO | Source: Ambulatory Visit | Attending: Family | Admitting: Family

## 2022-07-23 ENCOUNTER — Telehealth: Payer: Self-pay | Admitting: Pulmonary Disease

## 2022-07-23 DIAGNOSIS — I2510 Atherosclerotic heart disease of native coronary artery without angina pectoris: Secondary | ICD-10-CM

## 2022-07-23 NOTE — Telephone Encounter (Signed)
Called and spoke to patient and went over that the $90 was a co pay for 3 month of the Anoro inhaler. I called her pharmacy to confirm the cost of her medication for 3 months. They are getting medication ready for her. Called patient back to let her know I did confirm the cost for 3 month supply and it is $90. She states that she is ok with that cost and will go get her inhaler today sometime. Nothing further needed

## 2022-07-23 NOTE — Telephone Encounter (Signed)
Ladies,  I was unable to read the message from the last pharmacy lady. Can anyone tell me what inhalers are covered for patient under her insurance. She is stating the Anoro is too expensive.   Thank you

## 2022-07-23 NOTE — Telephone Encounter (Signed)
Patient called again to inform the doctor that the medication, Amber [name] is too expensive and she said she cannot afford it even with her insurance.  She would like to know if there is a alternative medication that she can get.  Please call patient to let her know at [phone]

## 2022-07-23 NOTE — Telephone Encounter (Signed)
Per benefits investigation it seems that the co-pay of $90.00 is for a 3 month supply. Test claim shows 1 month of Anoro at $30.00. The manufacture for Amber [name] is currently covering part of the cost of the patients co-pay coming to $9.99 per month to still achieve double therapy.

## 2022-10-15 ENCOUNTER — Ambulatory Visit
Admission: RE | Admit: 2022-10-15 | Discharge: 2022-10-15 | Disposition: A | Discharge status: Home / Self Care | Payer: BC Managed Care – PPO | Source: Ambulatory Visit | Attending: Family Medicine | Admitting: Family Medicine

## 2022-10-15 ENCOUNTER — Other Ambulatory Visit: Payer: Self-pay | Admitting: Family Medicine

## 2022-10-15 DIAGNOSIS — M545 Low back pain, unspecified: Secondary | ICD-10-CM

## 2022-11-05 ENCOUNTER — Encounter: Payer: Self-pay | Admitting: Pulmonary Disease

## 2022-11-05 ENCOUNTER — Ambulatory Visit: Payer: BC Managed Care – PPO | Admitting: Pulmonary Disease

## 2022-11-05 VITALS — BP 128/76 | HR 59 | Ht 65.0 in | Wt 150.6 lb

## 2022-11-05 DIAGNOSIS — R0602 Shortness of breath: Secondary | ICD-10-CM | POA: Diagnosis not present

## 2022-11-05 NOTE — Patient Instructions (Signed)
Continue using Anoro daily  Rescue inhaler use as needed  Graded exercises as tolerated  I will see you back in 6 months  Call us with any significant concerns

## 2022-11-05 NOTE — Progress Notes (Signed)
Amber [name]    UF:[id number]    December 04, 1960  Primary Care Physician:Smith, Hal Hope, MD  Referring Physician: [name], [place],  [place] [address]  Chief complaint: Patient being seen for shortness of breath Stable since her last visit  HPI:  Breathing has been more stable with Anoro use on a daily basis Rarely uses rescue inhaler  Has been trying to stay active, exercising regularly  Quit smoking in 2018 was smoking about a pack a day for 40 years  Has not had any acute exacerbations of shortness of breath or any protracted wheezing  Denies a cough, denies any chest pain or chest discomfort 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   aspirin EC 81 MG tablet Take 1 tablet (81 mg total) by mouth daily.   atenolol (TENORMIN) 25 MG tablet Take 1 tablet (25 mg total) by mouth daily.   BIOTIN PO Take 2 tablets by mouth daily.   cholecalciferol (VITAMIN D) 1000 units tablet Take 1,000 Units by mouth daily.    clobetasol ointment (TEMOVATE) 0.05 % Apply topically.   Coenzyme Q10 (COQ10 PO) Take 1 tablet by mouth daily.   cyanocobalamin (,VITAMIN B-12,) 1000 MCG/ML injection Inject 1,000 mcg into the muscle every 30 (thirty) days. Reported on 02/11/2016   ezetimibe (ZETIA) 10 MG tablet Take 1 tablet (10 mg total) by mouth daily.   gabapentin (NEURONTIN) 300 MG capsule Take 1 capsule (300 mg total) by mouth at bedtime.   IRON PO Take 1 tablet by mouth at bedtime as needed. When feeling  tired   levothyroxine (SYNTHROID) 88 MCG tablet Take 88 mcg by mouth daily before breakfast.   lisinopril (ZESTRIL) 5 MG tablet Take 1 tablet (5 mg total) by mouth daily.   meloxicam (MOBIC) 15 MG tablet Take 15 mg by mouth daily.   metFORMIN (GLUCOPHAGE) 500 MG tablet Take 1,000 mg by mouth 2 (two) times daily with a meal.   mometasone (NASONEX) 50 MCG/ACT nasal spray Place 2 sprays into the nose daily as needed. For allergies   nitroGLYCERIN (NITROSTAT) 0.4 MG SL tablet Place 1 tablet (0.4 mg total) under the tongue every 5 (five) minutes as needed for chest pain. Reported on 02/11/2016   Omega-3 Fatty Acids (FISH OIL PO) Take 1 capsule by mouth daily.   rosuvastatin (CRESTOR) 20 MG tablet Take 1 tablet (20 mg total) by mouth every other day.   sertraline (ZOLOFT) 50 MG tablet Take 75 mg by mouth daily.   SODIUM FLUORIDE 5000 SENSITIVE 1.1-5 % GEL Take by mouth as directed.   triamcinolone (KENALOG) 0.1 % paste Use as directed 1 application in the mouth or throat as needed (FOR GUMS).    umeclidinium-vilanterol (ANORO ELLIPTA) 62.5-25 MCG/ACT AEPB Inhale 1 puff into the lungs daily.   valACYclovir (VALTREX) 500 MG tablet Take 500 mg by mouth daily as needed (Cold sore). Reported on 02/11/2016   zolpidem (AMBIEN) 10 MG tablet Take 10 mg by  mouth at bedtime as needed for sleep. Reported on 02/11/2016   amoxicillin (AMOXIL) 500 MG capsule Take 500 mg by mouth as directed. Takes for dental cleaning or any type of procedures may have (Patient not taking: Reported on 11/05/2022)   No facility-administered encounter medications on file as of 11/05/2022.    Allergies as of 11/05/2022 - Review Complete 11/05/2022  Allergen Reaction Noted   Tramadol Nausea And Vomiting 06/22/2012   Benzoyl peroxide Itching and Rash 11/01/2013   Betadine [povidone iodine] Rash 08/10/2012    Past Medical History:  Diagnosis Date   Allergic rhinitis    Anginal pain ([facility]) 2001   Breast cancer ([facility])    invasive ductal  carcinoma right breast; ER, PR + and HER 2 -   Chronic neck pain    hx of   Diabetes mellitus without complication (HCC)    DJD (degenerative joint disease) of hip    bil hip   GERD (gastroesophageal reflux disease)    hx of    Heart attack [facility]) 2001   Dr. Scarlette Calico   History of radiation therapy 09/16/12- 10/19/12   right breast   Hyperlipidemia    Hypertension    Dr. [facility]   Hypothyroidism    Migraines    "last one was 1990's" (08/17/2012)   Neuromuscular disorder ([facility])    carpal tunnel right hand   Osteoporosis    Pernicious anemia    Sickle cell trait ([facility])    Sleep apnea    does use a cpap   Stroke ([facility])    Tobacco abuse    Wears glasses     Past Surgical History:  Procedure Laterality Date   BREAST BIOPSY  06/2012   "right" (08/17/2012)   COLONOSCOPY     CORONARY ANGIOPLASTY WITH STENT PLACEMENT  2001   "1" (08/17/2012)   DORSAL COMPARTMENT RELEASE Left 08/18/2014   Procedure: LEFT FIRST RELEASE DORSAL COMPARTMENT (DEQUERVAIN);  Surgeon: Hessie Dibble, MD;  Location: [facility];  Service: Orthopedics;  Laterality: Left;   NASAL SINUS SURGERY  1990   PARTIAL MASTECTOMY WITH NEEDLE LOCALIZATION AND AXILLARY SENTINEL LYMPH NODE BX  07/15/2012   Procedure: PARTIAL MASTECTOMY WITH NEEDLE LOCALIZATION AND AXILLARY SENTINEL LYMPH NODE BX;  Surgeon: Adin Hector, MD;  Location: [facility];  Service: General;  Laterality: Right;   TOTAL HIP ARTHROPLASTY  08/17/2012   "right" (08/17/2012)   TOTAL HIP ARTHROPLASTY  08/17/2012   Procedure: TOTAL HIP ARTHROPLASTY ANTERIOR APPROACH;  Surgeon: Hessie Dibble, MD;  Location: [facility];  Service: Orthopedics;  Laterality: Right;   TOTAL HIP ARTHROPLASTY Left 08/21/2015   TOTAL HIP ARTHROPLASTY Left 08/21/2015   Procedure: TOTAL HIP ARTHROPLASTY ANTERIOR APPROACH AND RIGHT TRIGGER THUMB RELEASE;  Surgeon: Melrose Nakayama, MD;  Location: [facility];  Service: Orthopedics;  Laterality: Left;     Family History  Problem Relation Age of Onset   Diabetes Mother    Heart disease Mother    Cancer Maternal Grandmother        pancreatic   Cancer Cousin        pancreatic   Parkinson's disease Maternal Grandfather     Social History   Socioeconomic History   Marital status: Single    Spouse name: Not on file   Number of children: 0   Years of education: Not on file   Highest education level: Not on file  Occupational History   Not on file  Tobacco Use  Smoking status: Former    Packs/day: 1.00    Years: 35.00    Total pack years: 35.00    Types: Cigarettes   Smokeless tobacco: Never  Substance and Sexual Activity   Alcohol use: Yes    Comment: 08/17/2012 "couple mixed drinks; couple times/month; sometimes it's beer"   Drug use: No   Sexual activity: Not Currently  Other Topics Concern   Not on file  Social History Narrative   Right handed   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 story home with [place]   Social Determinants of Health   Financial Resource Strain: Not on file  Food Insecurity: Not on file  Transportation Needs: Not on file  Physical Activity: Not on file  Stress: Not on file  Social Connections: Not on file  Intimate Partner Violence: Not on file    Review of Systems  Respiratory:  Positive for shortness of breath.     Vitals:   11/05/22 0835  BP: 128/76  Pulse: (!) 59  SpO2: 98%     Physical Exam Constitutional:      Appearance: Normal appearance.  HENT:     Head: Normocephalic.     Mouth/Throat:     Mouth: Mucous membranes are moist.  Cardiovascular:     Rate and Rhythm: Normal rate and regular rhythm.     Heart sounds: No murmur heard.    No friction rub.  Pulmonary:     Effort: No respiratory distress.     Breath sounds: No stridor. No wheezing or rhonchi.  Musculoskeletal:     Cervical back: No rigidity or tenderness.  Neurological:     Mental Status: She is alert.  Psychiatric:        Mood and Affect:  Mood normal.     Data Reviewed: Recent echocardiogram 11/03/2021-within normal limits with normal ejection fraction normal right-sided pressures  Chest x-ray 05/06/2022-no acute abnormality  Pulmonary function tests 06/26/2022-reviewed by myself with the patient shows mild obstructive disease with no significant bronchodilator response, no restriction, mild reduction in diffusing capacity  Assessment:   62 year old lady with shortness of breath, past history of smoking Has been stable Does have a history of heart disease, history of diabetes-stable History of obstructive sleep apnea-compliant with CPAP stable  Mild obstructive airway disease/COPD  Deconditioning -This is improving Has been staying very active  She is compliant with inhaler use   Plan/Recommendations:  Encourage graded exercise as tolerated  Continue Anoro  Rescue inhaler use as needed  Follow-up in about 6 months  Encouraged to continue using CPAP therapy   Sherrilyn Rist MD [facility] 11/05/2022, 8:43 AM  CC: [name], MD

## 2022-11-26 ENCOUNTER — Ambulatory Visit
Admission: RE | Admit: 2022-11-26 | Discharge: 2022-11-26 | Disposition: A | Discharge status: Home / Self Care | Payer: BC Managed Care – PPO | Source: Ambulatory Visit | Attending: Family Medicine | Admitting: Family Medicine

## 2022-11-26 DIAGNOSIS — E2839 Other primary ovarian failure: Secondary | ICD-10-CM

## 2022-11-26 DIAGNOSIS — Z1231 Encounter for screening mammogram for malignant neoplasm of breast: Secondary | ICD-10-CM

## 2022-11-26 HISTORY — DX: Personal history of irradiation: Z92.3

## 2023-02-02 NOTE — Progress Notes (Unsigned)
NEUROLOGY FOLLOW UP OFFICE NOTE  Amber [name] [id number]  Assessment/Plan:    Diabetic polyneuropathy   1.  Gabapentin 300mg  at bedtime 2.  Follow up one year   Subjective:  Amber [name] is a 62 year old right-handed woman with CAD, type 2 diabetes mellitus, HTN, hyperlipidemia, tobacco use disorder and history of stroke and breast cancer (no chemotherapy) who follows up for diabetic polyneuropathy   UPDATE: Current medication:  Gabapentin 300mg  at bedtime She is doing well.  Pain is controlled.    HISTORY: For several years, she experienced intermittent discomfort (tingling/burning) in feet and legs, particularly at night.  She was diagnosed with diabetes in 2019.  Since then, she has been having neuropathy (right foot worse than left foot).  It feels like pins and needles in the feet as well as burning.  Big toe on the right feels tender.  Notes some numbness.  No falls.  She feels that the symptoms are unchanged.     Her blood sugars have been good.  Typically morning readings are less than 130 and second reading 150.  Occasionally in the 170s.  Hgb A1c from 09/09/18 was 6.3.  She also has history of B12 deficiency/pernicious anemia for which she takes injections and over the counter pills.  Level from 09/09/18 was >1525.   She was told to stop the oral pills and symptoms worsened.  She has history of hypothyroidism well controlled.  TSH from 09/09/18 was 2.76.    PAST MEDICAL HISTORY: Past Medical History:  Diagnosis Date   Allergic rhinitis    Anginal pain (HCC) 2001   Breast cancer (HCC)    invasive ductal carcinoma right breast; ER, PR + and HER 2 -   Chronic neck pain    hx of   Diabetes mellitus without complication (HCC)    DJD (degenerative joint disease) of hip    bil hip   GERD (gastroesophageal reflux disease)    hx of    Heart attack (HCC) 2001   Dr. Isabel Caprice   History of radiation therapy 09/16/12- 10/19/12   right breast   Hyperlipidemia    Hypertension     [facility]   Hypothyroidism    Migraines    "last one was 1990's" (08/17/2012)   Neuromuscular disorder (HCC)    carpal tunnel right hand   Osteoporosis    Pernicious anemia    Personal history of radiation therapy    Sickle cell trait (HCC)    Sleep apnea    does use a cpap   Stroke (HCC)    Tobacco abuse    Wears glasses     MEDICATIONS: Current Outpatient Medications on File Prior to Visit  Medication Sig Dispense Refill   ACCU-CHEK GUIDE test strip USE 1 STRIP TO CHECK GLUCOSE ONCE DAILY FOR 100 DAYS     Accu-Chek Softclix Lancets lancets SMARTSIG:Topical     albuterol (VENTOLIN HFA) 108 (90 Base) MCG/ACT inhaler Inhale 2 puffs into the lungs every 4 (four) hours as needed for wheezing or shortness of breath. 1 each 5   amoxicillin (AMOXIL) 500 MG capsule Take 500 mg by mouth as directed. Takes for dental cleaning or any type of procedures may have (Patient not taking: Reported on 11/05/2022)     aspirin EC 81 MG tablet Take 1 tablet (81 mg total) by mouth daily. 90 tablet 3   atenolol (TENORMIN) 25 MG tablet Take 1 tablet (25 mg total) by mouth daily. 90  tablet 3   BIOTIN PO Take 2 tablets by mouth daily.     cholecalciferol (VITAMIN D) 1000 units tablet Take 1,000 Units by mouth daily.      clobetasol ointment (TEMOVATE) 0.05 % Apply topically.     Coenzyme Q10 (COQ10 PO) Take 1 tablet by mouth daily.     cyanocobalamin (,VITAMIN B-12,) 1000 MCG/ML injection Inject 1,000 mcg into the muscle every 30 (thirty) days. Reported on 02/11/2016     ezetimibe (ZETIA) 10 MG tablet Take 1 tablet (10 mg total) by mouth daily. 90 tablet 3   gabapentin (NEURONTIN) 300 MG capsule Take 1 capsule (300 mg total) by mouth at bedtime. 90 capsule 3   IRON PO Take 1 tablet by mouth at bedtime as needed. When feeling tired     levothyroxine (SYNTHROID) 88 MCG tablet Take 88 mcg by mouth daily before breakfast.     lisinopril (ZESTRIL) 5 MG tablet Take 1 tablet (5 mg  total) by mouth daily. 90 tablet 3   meloxicam (MOBIC) 15 MG tablet Take 15 mg by mouth daily.     metFORMIN (GLUCOPHAGE) 500 MG tablet Take 1,000 mg by mouth 2 (two) times daily with a meal.     mometasone (NASONEX) 50 MCG/ACT nasal spray Place 2 sprays into the nose daily as needed. For allergies     nitroGLYCERIN (NITROSTAT) 0.4 MG SL tablet Place 1 tablet (0.4 mg total) under the tongue every 5 (five) minutes as needed for chest pain. Reported on 02/11/2016 25 tablet 2   Omega-3 Fatty Acids (FISH OIL PO) Take 1 capsule by mouth daily.     rosuvastatin (CRESTOR) 20 MG tablet Take 1 tablet (20 mg total) by mouth every other day. 45 tablet 3   sertraline (ZOLOFT) 50 MG tablet Take 75 mg by mouth daily.     SODIUM FLUORIDE 5000 SENSITIVE 1.1-5 % GEL Take by mouth as directed.     triamcinolone (KENALOG) 0.1 % paste Use as directed 1 application in the mouth or throat as needed (FOR GUMS).      umeclidinium-vilanterol (ANORO ELLIPTA) 62.5-25 MCG/ACT AEPB Inhale 1 puff into the lungs daily. 60 each 5   valACYclovir (VALTREX) 500 MG tablet Take 500 mg by mouth daily as needed (Cold sore). Reported on 02/11/2016     zolpidem (AMBIEN) 10 MG tablet Take 10 mg by mouth at bedtime as needed for sleep. Reported on 02/11/2016     No current facility-administered medications on file prior to visit.    ALLERGIES: Allergies  Allergen Reactions   Tramadol Nausea And Vomiting    "couldn't stop vomiting" (08/17/2012)   Benzoyl Peroxide Itching and Rash   Betadine [Povidone Iodine] Rash    FAMILY HISTORY: Family History  Problem Relation Age of Onset   Diabetes Mother    Heart disease Mother    Cancer Maternal Grandmother        pancreatic   Cancer Cousin        pancreatic   Parkinson's disease Maternal Grandfather       Objective:  Blood pressure 127/60, pulse 64, height 5\' 3"  (1.6 m), weight 143 lb (64.9 kg), SpO2 96 %.. General: No acute distress.  Patient appears well-groomed.   Head:   Normocephalic/atraumatic Eyes:  Fundi examined but not visualized Heart:  Regular rate and rhythm Neurological Exam: alert and oriented.  Speech fluent and not dysarthric, language intact.  CN II-XII intact. Bulk and tone normal, muscle strength 5/5 throughout.  Sensation to pinprick and  vibration intact.  Deep tendon reflexes 1+ throughout.  Finger to nose testing intact.  Gait normal, Romberg negative.   Shon Millet, DO  CC: Merri Brunette, MD

## 2023-02-03 ENCOUNTER — Ambulatory Visit: Payer: BC Managed Care – PPO | Admitting: Neurology

## 2023-02-03 ENCOUNTER — Other Ambulatory Visit: Payer: Self-pay | Admitting: Neurology

## 2023-02-03 ENCOUNTER — Encounter: Payer: Self-pay | Admitting: Neurology

## 2023-02-03 VITALS — BP 127/60 | HR 64 | Ht 63.0 in | Wt 143.0 lb

## 2023-02-03 DIAGNOSIS — E1142 Type 2 diabetes mellitus with diabetic polyneuropathy: Secondary | ICD-10-CM

## 2023-02-03 MED ORDER — GABAPENTIN 300 MG PO CAPS: 300.0000 mg | ORAL_CAPSULE | Freq: Every day | ORAL | 3 refills | Status: DC

## 2023-05-26 ENCOUNTER — Ambulatory Visit: Payer: BC Managed Care – PPO | Admitting: Pulmonary Disease

## 2023-05-26 ENCOUNTER — Encounter: Payer: Self-pay | Admitting: Pulmonary Disease

## 2023-05-26 VITALS — BP 112/60 | HR 72 | Temp 98.1°F | Ht 64.0 in | Wt 142.6 lb

## 2023-05-26 DIAGNOSIS — G4733 Obstructive sleep apnea (adult) (pediatric): Secondary | ICD-10-CM | POA: Diagnosis not present

## 2023-05-26 DIAGNOSIS — R0602 Shortness of breath: Secondary | ICD-10-CM | POA: Diagnosis not present

## 2023-05-26 NOTE — Patient Instructions (Signed)
I will see you a year from now  Continue using your Anoro Continue using albuterol on a as needed  Graded activities as tolerated  Continue using your CPAP as well

## 2023-05-26 NOTE — Progress Notes (Signed)
Amber [name]    [id number]    01/08/1961  Primary Care Physician:Smith, Sonny Masters, MD  Referring Physician: Merri Brunette, MD [phone] [place],  [place] [address]  Chief complaint: Patient being seen for shortness of breath Stable since her last visit  HPI:  Breathing has been more stable with Anoro use on a daily basis Rarely uses rescue inhaler  Has been trying to stay active, exercising regularly  Quit smoking in 2018 was smoking about a pack a day for 40 years  Has not had any acute exacerbations of shortness of breath or any protracted wheezing  Denies a cough, denies any chest pain or chest discomfort No shortness of breath at rest  Office worker  No family history of lung disease  No history of asthma growing up  No known allergies  Outpatient Encounter Medications as of 05/26/2023  Medication Sig  . ACCU-CHEK GUIDE test strip USE 1 STRIP TO CHECK GLUCOSE ONCE DAILY FOR 100 DAYS  . Accu-Chek Softclix Lancets lancets SMARTSIG:Topical  . albuterol (VENTOLIN HFA) 108 (90 Base) MCG/ACT inhaler Inhale 2 puffs into the lungs every 4 (four) hours as needed for wheezing or shortness of breath.  Marland Kitchen amoxicillin (AMOXIL) 500 MG capsule Take 500 mg by mouth as directed. Takes for dental cleaning or any type of procedures may have  . aspirin EC 81 MG tablet Take 1 tablet (81 mg total) by mouth daily.  Marland Kitchen atenolol (TENORMIN) 25 MG tablet Take 1 tablet (25 mg total) by mouth daily.  Marland Kitchen BIOTIN PO Take 1 tablet by mouth daily.  . cholecalciferol (VITAMIN D) 1000 units tablet Take 1,000 Units by mouth daily.   . clobetasol ointment (TEMOVATE) 0.05 % Apply topically.  . Coenzyme Q10 (COQ10 PO) Take 1 tablet by mouth daily.  . cyanocobalamin (,VITAMIN B-12,) 1000 MCG/ML injection Inject 1,000 mcg into the muscle every 30 (thirty) days. Reported on 02/11/2016  . ezetimibe (ZETIA) 10 MG tablet Take 1 tablet (10 mg total) by mouth daily.  Marland Kitchen gabapentin  (NEURONTIN) 300 MG capsule Take 1 capsule (300 mg total) by mouth at bedtime.  . IRON PO Take 1 tablet by mouth at bedtime as needed. When feeling tired  . levothyroxine (SYNTHROID) 88 MCG tablet Take 88 mcg by mouth daily before breakfast.  . lisinopril (ZESTRIL) 5 MG tablet Take 1 tablet (5 mg total) by mouth daily.  . meloxicam (MOBIC) 15 MG tablet Take 15 mg by mouth daily.  . metFORMIN (GLUCOPHAGE) 500 MG tablet Take 1,000 mg by mouth 2 (two) times daily with a meal.  . mometasone (NASONEX) 50 MCG/ACT nasal spray Place 2 sprays into the nose daily as needed. For allergies  . nitroGLYCERIN (NITROSTAT) 0.4 MG SL tablet Place 1 tablet (0.4 mg total) under the tongue every 5 (five) minutes as needed for chest pain. Reported on 02/11/2016  . Omega-3 Fatty Acids (FISH OIL PO) Take 1 capsule by mouth daily.  . rosuvastatin (CRESTOR) 20 MG tablet Take 1 tablet (20 mg total) by mouth every other day.  . sertraline (ZOLOFT) 50 MG tablet Take 75 mg by mouth daily.  . SODIUM FLUORIDE 5000 SENSITIVE 1.1-5 % GEL Take by mouth as directed.  . triamcinolone (KENALOG) 0.1 % paste Use as directed 1 application in the mouth or throat as needed (FOR GUMS).   Marland Kitchen umeclidinium-vilanterol (ANORO ELLIPTA) 62.5-25 MCG/ACT AEPB Inhale 1 puff into the lungs daily.  . valACYclovir (VALTREX) 500 MG  tablet Take 500 mg by mouth daily as needed (Cold sore). Reported on 02/11/2016  . zolpidem (AMBIEN) 10 MG tablet Take 10 mg by mouth at bedtime as needed for sleep. Reported on 02/11/2016   No facility-administered encounter medications on file as of 05/26/2023.    Allergies as of 05/26/2023 - Review Complete 05/26/2023  Allergen Reaction Noted  . Tramadol Nausea And Vomiting 06/22/2012  . Benzoyl peroxide Itching and Rash 11/01/2013  . Betadine [povidone iodine] Rash 08/10/2012    Past Medical History:  Diagnosis Date  . Allergic rhinitis   . Anginal pain (HCC) 2001  . Breast cancer (HCC)    invasive ductal carcinoma  right breast; ER, PR + and HER 2 -  . Chronic neck pain    hx of  . Diabetes mellitus without complication (HCC)   . DJD (degenerative joint disease) of hip    bil hip  . GERD (gastroesophageal reflux disease)    hx of   . Heart attack [facility]) 2001   Dr. Isabel Caprice  . History of radiation therapy 09/16/12- 10/19/12   right breast  . Hyperlipidemia   . Hypertension    [facility]  . Hypothyroidism   . Migraines    "last one was 1990's" (08/17/2012)  . Neuromuscular disorder (HCC)    carpal tunnel right hand  . Osteoporosis   . Pernicious anemia   . Personal history of radiation therapy   . Sickle cell trait (HCC)   . Sleep apnea    does use a cpap  . Stroke (HCC)   . Tobacco abuse   . Wears glasses     Past Surgical History:  Procedure Laterality Date  . BREAST BIOPSY Right 2013  . BREAST BIOPSY Right 2019  . BREAST LUMPECTOMY Right 2013  . COLONOSCOPY    . CORONARY ANGIOPLASTY WITH STENT PLACEMENT  09/02/1999   "1" (08/17/2012)  . DORSAL COMPARTMENT RELEASE Left 08/18/2014   Procedure: LEFT FIRST RELEASE DORSAL COMPARTMENT (DEQUERVAIN);  Surgeon: Velna Ochs, MD;  Location: [facility];  Service: Orthopedics;  Laterality: Left;  . NASAL SINUS SURGERY  09/01/1988  . PARTIAL MASTECTOMY WITH NEEDLE LOCALIZATION AND AXILLARY SENTINEL LYMPH NODE BX  07/15/2012   Procedure: PARTIAL MASTECTOMY WITH NEEDLE LOCALIZATION AND AXILLARY SENTINEL LYMPH NODE BX;  Surgeon: Ernestene Mention, MD;  Location: [facility] OR;  Service: General;  Laterality: Right;  . TOTAL HIP ARTHROPLASTY  08/17/2012   "right" (08/17/2012)  . TOTAL HIP ARTHROPLASTY  08/17/2012   Procedure: TOTAL HIP ARTHROPLASTY ANTERIOR APPROACH;  Surgeon: Velna Ochs, MD;  Location: [facility] OR;  Service: Orthopedics;  Laterality: Right;  . TOTAL HIP ARTHROPLASTY Left 08/21/2015  . TOTAL HIP ARTHROPLASTY Left 08/21/2015   Procedure: TOTAL HIP ARTHROPLASTY ANTERIOR APPROACH AND RIGHT  TRIGGER THUMB RELEASE;  Surgeon: Marcene Corning, MD;  Location: [facility] OR;  Service: Orthopedics;  Laterality: Left;    Family History  Problem Relation Age of Onset  . Diabetes Mother   . Heart disease Mother   . Cancer Maternal Grandmother        pancreatic  . Cancer Cousin        pancreatic  . Parkinson's disease Maternal Grandfather     Social History   Socioeconomic History  . Marital status: Single    Spouse name: Not on file  . Number of children: 0  . Years of education: Not on file  . Highest education level: Not on file  Occupational History  .  Not on file  Tobacco Use  . Smoking status: Former    Current packs/day: 1.00    Average packs/day: 1 pack/day for 35.0 years (35.0 ttl pk-yrs)    Types: Cigarettes  . Smokeless tobacco: Never  Substance and Sexual Activity  . Alcohol use: Yes    Comment: 08/17/2012 "couple mixed drinks; couple times/month; sometimes it's beer"  . Drug use: No  . Sexual activity: Not Currently  Other Topics Concern  . Not on file  Social History Narrative   Right handed   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 story 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   [employer]   Social Determinants of Health   Financial Resource Strain: Not on file  Food Insecurity: Not on file  Transportation Needs: Not on file  Physical Activity: Not on file  Stress: Not on file  Social Connections: Unknown (01/13/2022)   Received from [facility], [facility]   Social Network   . Social Network: Not on file  Intimate Partner Violence: Unknown (12/05/2021)   Received from [facility], [facility]   HITS   . Physically Hurt: Not on file   . Insult or Talk Down To: Not on file   . Threaten Physical Harm: Not on file   . Scream or Curse: Not on file    Review of Systems  Respiratory:  Positive for shortness of breath.     Vitals:   05/26/23 0838  BP: 112/60  Pulse: 72  Temp: 98.1 F (36.7 C)  SpO2: 97%     Physical Exam Constitutional:      Appearance: Normal  appearance.  HENT:     Head: Normocephalic.     Mouth/Throat:     Mouth: Mucous membranes are moist.  Cardiovascular:     Rate and Rhythm: Normal rate and regular rhythm.     Heart sounds: No murmur heard.    No friction rub.  Pulmonary:     Effort: No respiratory distress.     Breath sounds: No stridor. No wheezing or rhonchi.  Musculoskeletal:     Cervical back: No rigidity or tenderness.  Neurological:     Mental Status: She is alert.  Psychiatric:        Mood and Affect: Mood normal.    Data Reviewed: Recent echocardiogram 11/03/2021-within normal limits with normal ejection fraction normal right-sided pressures  Chest x-ray 05/06/2022-no acute abnormality  Pulmonary function tests 06/26/2022-reviewed by myself with the patient shows mild obstructive disease with no significant bronchodilator response, no restriction, mild reduction in diffusing capacity  Assessment:   62 year old lady with shortness of breath, past history of smoking Has been stable Does have a history of heart disease, history of diabetes-stable History of obstructive sleep apnea-compliant with CPAP stable  Mild obstructive airway disease/COPD  Deconditioning -This is improving Has been staying very active  She is compliant with inhaler use   Plan/Recommendations:  Encourage graded exercise as tolerated  Continue Anoro  Rescue inhaler use as needed  Follow-up in about 6 months  Encouraged to continue using CPAP therapy   Virl Diamond MD  Pulmonary and Critical Care 05/26/2023, 8:42 AM  CC: Merri Brunette, MD

## 2023-06-03 ENCOUNTER — Ambulatory Visit: Payer: BC Managed Care – PPO | Admitting: Interventional Cardiology

## 2023-08-24 ENCOUNTER — Encounter: Payer: Self-pay | Admitting: Internal Medicine

## 2023-08-24 ENCOUNTER — Ambulatory Visit: Payer: BC Managed Care – PPO | Attending: Interventional Cardiology | Admitting: Internal Medicine

## 2023-08-24 VITALS — BP 122/64 | HR 57 | Ht 64.0 in | Wt 141.4 lb

## 2023-08-24 DIAGNOSIS — I2510 Atherosclerotic heart disease of native coronary artery without angina pectoris: Secondary | ICD-10-CM

## 2023-08-24 NOTE — Progress Notes (Signed)
Cardiology Office Note   Date:  08/24/2023   ID:  Amber [name], DOB [birth year], MRN [id number]  PCP:  Merri Brunette, MD   Pt presents for follow up of CAD   Wt Readings from Last 3 Encounters:  08/24/23 141 lb 6.4 oz (64.1 kg)  05/26/23 142 lb 9.6 oz (64.7 kg)  02/03/23 143 lb (64.9 kg)       History of Present Illness: Amber [name] is a 62 y.o. female with hx of CAD, s/p anterior MI in 2001.  She had a stent placed by Dr. Amil Amen. . Pt complained of DOE in May 2023   Echo showed normal LV/RV function    normal valve function   Myoview was normal   Set up for PFTs which showed some obstruction   Sent to pulmonary clinic  Atenolol was cut to 12.5 mg daily due to sinus bradycardia.  The pt denies CP   No SOB   NO palpitations   No dizziness    Pt just retiried from work at the end of November   Diet: Br:  egg, sausage, maybe toast  Coffee w/ cream and sugar Lunch Depends Often salad with protein Water Dinner  Varies   [place] or Leftovers  or Theme park manager   Past Medical History:  Diagnosis Date   Allergic rhinitis    Anginal pain (HCC) 2001   Breast cancer (HCC)    invasive ductal carcinoma right breast; ER, PR + and HER 2 -   Chronic neck pain    hx of   Diabetes mellitus without complication (HCC)    DJD (degenerative joint disease) of hip    bil hip   GERD (gastroesophageal reflux disease)    hx of    Heart attack (HCC) 2001   Dr. Isabel Caprice   History of radiation therapy 09/16/12- 10/19/12   right breast   Hyperlipidemia    Hypertension    [facility]   Hypothyroidism    Migraines    "last one was 1990's" (08/17/2012)   Neuromuscular disorder (HCC)    carpal tunnel right hand   Osteoporosis    Pernicious anemia    Personal history of radiation therapy    Sickle cell trait (HCC)    Sleep apnea    does use a cpap   Stroke (HCC)    Tobacco abuse    Wears glasses     Past Surgical History:  Procedure  Laterality Date   BREAST BIOPSY Right 2013   BREAST BIOPSY Right 2019   BREAST LUMPECTOMY Right 2013   COLONOSCOPY     CORONARY ANGIOPLASTY WITH STENT PLACEMENT  09/02/1999   "1" (08/17/2012)   DORSAL COMPARTMENT RELEASE Left 08/18/2014   Procedure: LEFT FIRST RELEASE DORSAL COMPARTMENT (DEQUERVAIN);  Surgeon: Velna Ochs, MD;  Location: [facility];  Service: Orthopedics;  Laterality: Left;   NASAL SINUS SURGERY  09/01/1988   PARTIAL MASTECTOMY WITH NEEDLE LOCALIZATION AND AXILLARY SENTINEL LYMPH NODE BX  07/15/2012   Procedure: PARTIAL MASTECTOMY WITH NEEDLE LOCALIZATION AND AXILLARY SENTINEL LYMPH NODE BX;  Surgeon: Ernestene Mention, MD;  [facility]: [facility];  Service: General;  Laterality: Right;   TOTAL HIP ARTHROPLASTY  08/17/2012   "right" (08/17/2012)   TOTAL HIP ARTHROPLASTY  08/17/2012   Procedure: TOTAL HIP ARTHROPLASTY ANTERIOR APPROACH;  Surgeon: Velna Ochs, MD;  Location: [facility];  Service: Orthopedics;  Laterality: Right;   TOTAL HIP ARTHROPLASTY Left 08/21/2015  TOTAL HIP ARTHROPLASTY Left 08/21/2015   Procedure: TOTAL HIP ARTHROPLASTY ANTERIOR APPROACH AND RIGHT TRIGGER THUMB RELEASE;  Surgeon: Marcene Corning, MD;  Location: [facility];  Service: Orthopedics;  Laterality: Left;     Current Outpatient Medications  Medication Sig Dispense Refill   ACCU-CHEK GUIDE test strip USE 1 STRIP TO CHECK GLUCOSE ONCE DAILY FOR 100 DAYS     Accu-Chek Softclix Lancets lancets SMARTSIG:Topical     albuterol (VENTOLIN HFA) 108 (90 Base) MCG/ACT inhaler Inhale 2 puffs into the lungs every 4 (four) hours as needed for wheezing or shortness of breath. 1 each 5   amoxicillin (AMOXIL) 500 MG capsule Take 500 mg by mouth as directed. Takes for dental cleaning or any type of procedures may have     aspirin EC 81 MG tablet Take 1 tablet (81 mg total) by mouth daily. 90 tablet 3   atenolol (TENORMIN) 25 MG tablet Take 1 tablet (25 mg total) by mouth daily. 90 tablet 3   BIOTIN PO  Take 1 tablet by mouth daily.     cholecalciferol (VITAMIN D) 1000 units tablet Take 1,000 Units by mouth daily.      clobetasol ointment (TEMOVATE) 0.05 % Apply topically.     Coenzyme Q10 (COQ10 PO) Take 1 tablet by mouth daily.     cyanocobalamin (,VITAMIN B-12,) 1000 MCG/ML injection Inject 1,000 mcg into the muscle every 30 (thirty) days. Reported on 02/11/2016     ezetimibe (ZETIA) 10 MG tablet Take 1 tablet (10 mg total) by mouth daily. 90 tablet 3   gabapentin (NEURONTIN) 300 MG capsule Take 1 capsule (300 mg total) by mouth at bedtime. 90 capsule 3   IRON PO Take 1 tablet by mouth at bedtime as needed. When feeling tired     levothyroxine (SYNTHROID) 100 MCG tablet Take 100 mcg by mouth daily before breakfast.     lisinopril (ZESTRIL) 5 MG tablet Take 1 tablet (5 mg total) by mouth daily. 90 tablet 3   metFORMIN (GLUCOPHAGE) 500 MG tablet Take 1,000 mg by mouth 2 (two) times daily with a meal.     nitroGLYCERIN (NITROSTAT) 0.4 MG SL tablet Place 1 tablet (0.4 mg total) under the tongue every 5 (five) minutes as needed for chest pain. Reported on 02/11/2016 25 tablet 2   Omega-3 Fatty Acids (FISH OIL PO) Take 1 capsule by mouth daily.     rosuvastatin (CRESTOR) 20 MG tablet Take 1 tablet (20 mg total) by mouth every other day. 45 tablet 3   sertraline (ZOLOFT) 50 MG tablet Take 75 mg by mouth daily.     SODIUM FLUORIDE 5000 SENSITIVE 1.1-5 % GEL Take by mouth as directed.     triamcinolone (KENALOG) 0.1 % paste Use as directed 1 application in the mouth or throat as needed (FOR GUMS).      umeclidinium-vilanterol (ANORO ELLIPTA) 62.5-25 MCG/ACT AEPB Inhale 1 puff into the lungs daily. 60 each 5   valACYclovir (VALTREX) 500 MG tablet Take 500 mg by mouth daily as needed (Cold sore). Reported on 02/11/2016     zolpidem (AMBIEN) 10 MG tablet Take 10 mg by mouth at bedtime as needed for sleep. Reported on 02/11/2016     No current facility-administered medications for this visit.     Allergies:   Tramadol, Benzoyl peroxide, and Betadine [povidone iodine]    Social History:  The patient  reports that she has quit smoking. Her smoking use included cigarettes. She has a 35 pack-year smoking history. She has  never used smokeless tobacco. She reports current alcohol use. She reports that she does not use drugs.   Family History:  The patient's family history includes Cancer in her cousin and maternal grandmother; Diabetes in her mother; Heart disease in her mother; Parkinson's disease in her maternal grandfather.    ROS:  Please see the history of present illness.   Otherwise, review of systems are positive for DOE.   All other systems are reviewed and negative.    PHYSICAL EXAM: VS:  BP 122/64   Pulse (!) 57   Ht 5\' 4"  (1.626 m)   Wt 141 lb 6.4 oz (64.1 kg)   SpO2 98%   BMI 24.27 kg/m  , BMI Body mass index is 24.27 kg/m. GEN: Pt is  in no acute distress HEENT: normal Neck: no JVD, carotid bruits Cardiac: RRR; no murmur  No LE edema  Respiratory:  clear to auscultation  GI: soft, nontender, no masses    MS: no deformity or atrophy  EKG:   EKG   Sinus bradycardia   50 bpm    Recent Labs: No results found for requested labs within last 365 days.   Lipid Panel    Component Value Date/Time   CHOL 115 01/04/2018 0754   TRIG 68 01/04/2018 0754   HDL 47 01/04/2018 0754   CHOLHDL 2.4 01/04/2018 0754   CHOLHDL 2 11/02/2014 0806   VLDL 16.4 11/02/2014 0806   LDLCALC 54 01/04/2018 0754     Other studies Reviewed: Additional studies/ records that were reviewed today with results demonstrating:labs reviewed .   ASSESSMENT AND PLAN:  CAD  Remoted MI with stent in 2001   Myoview in 2023 normal Pt denies CP   No significant SOB    Hyperlipidemia:  LDL 82  HDL 59  Trig 120   Thyroid function is off     Will follow   Next time get lipmed  3  HTN: Good control  4  DM   Last A1C 6.8    Discussed diet   LImit carbs    cut out sugar  Increase activity     5  Bradycardia   Follow    Pt denies dizziness   Follow up in 1 year   Current medicines are reviewed at length with the patient today.  The patient concerns regarding her medicines were addressed.  The following changes have been made:  No change  Labs/ tests ordered today include: Chest x-ray, pulmonary function tests  Orders Placed This Encounter  Procedures   EKG 12-Lead    Recommend 150 minutes/week of aerobic exercise Low fat, low carb, high fiber diet recommended  Disposition:   FU in 1 year or sooner if sx persist.   Signed, Dietrich Pates, MD  08/24/2023 4:28 PM    [facility] [address] [place], [place], [place]  [address] Phone: [phone]; Fax: [phone]

## 2023-08-24 NOTE — Patient Instructions (Signed)
Medication Instructions:  Your physician recommends that you continue on your current medications as directed. Please refer to the Current Medication list given to you today.  *If you need a refill on your cardiac medications before your next appointment, please call your pharmacy*  Lab Work: None ordered today.  Testing/Procedures: None ordered today.  Follow-Up: At [facility], you and your health needs are our priority.  As part of our continuing mission to provide you with exceptional heart care, we have created designated Provider Care Teams.  These Care Teams include your primary Cardiologist (physician) and Advanced Practice Providers (APPs -  Physician Assistants and Nurse Practitioners) who all work together to provide you with the care you need, when you need it.  Your next appointment:   1 year(s)  The format for your next appointment:   In Person  Provider:   Paula Ross, MD { 

## 2023-10-06 NOTE — Progress Notes (Signed)
 [facility] [address] [place] [address] Phone: [phone] Subjective:   LILLETTE Berwyn Posey, am serving as a scribe for Dr. Arthea Claudene.  I'm seeing this patient by the request  of:  Claudene Pellet, MD  CC: Numbness in feet  YEP:Dlagzrupcz  Lavonia MICCA MATURA is a 63 y.o. female coming in with complaint of neuropathy in feet. Patient states that she her feet are painful and sore. Also has been dealing with plantar fascitis. for decades. Takes gabapentin . Has intermittent back pain.    Previous back x-rays were independently visualized by me.  These were taken in February 2024.  Does have a significant anterior listhesis of L4 on L5 with degenerative disc disease at multiple levels.  Fairly significant facet arthropathy at L4-L5.  Medications patient is taking including gabapentin  300 mg at bedtime  Past Medical History:  Diagnosis Date   Allergic rhinitis    Anginal pain (HCC) 2001   Breast cancer (HCC)    invasive ductal carcinoma right breast; ER, PR + and HER 2 -   Chronic neck pain    hx of   Diabetes mellitus without complication (HCC)    DJD (degenerative joint disease) of hip    bil hip   GERD (gastroesophageal reflux disease)    hx of    Heart attack [facility]) 2001   Dr. Christi   History of radiation therapy 09/16/12- 10/19/12   right breast   Hyperlipidemia    Hypertension    [facility]   Hypothyroidism    Migraines    last one was 1990's (08/17/2012)   Neuromuscular disorder (HCC)    carpal tunnel right hand   Osteoporosis    Pernicious anemia    Personal history of radiation therapy    Sickle cell trait (HCC)    Sleep apnea    does use a cpap   Stroke (HCC)    Tobacco abuse    Wears glasses    Past Surgical History:  Procedure Laterality Date   BREAST BIOPSY Right 2013   BREAST BIOPSY Right 2019   BREAST LUMPECTOMY Right 2013   COLONOSCOPY     CORONARY ANGIOPLASTY WITH STENT  PLACEMENT  09/02/1999   1 (08/17/2012)   DORSAL COMPARTMENT RELEASE Left 08/18/2014   Procedure: LEFT FIRST RELEASE DORSAL COMPARTMENT (DEQUERVAIN);  Surgeon: Maude KANDICE Herald, MD;  Location: [facility];  Service: Orthopedics;  Laterality: Left;   NASAL SINUS SURGERY  09/01/1988   PARTIAL MASTECTOMY WITH NEEDLE LOCALIZATION AND AXILLARY SENTINEL LYMPH NODE BX  07/15/2012   Procedure: PARTIAL MASTECTOMY WITH NEEDLE LOCALIZATION AND AXILLARY SENTINEL LYMPH NODE BX;  Surgeon: Elon CHRISTELLA Pacini, MD;  Location: [facility];  Service: General;  Laterality: Right;   TOTAL HIP ARTHROPLASTY  08/17/2012   right (08/17/2012)   TOTAL HIP ARTHROPLASTY  08/17/2012   Procedure: TOTAL HIP ARTHROPLASTY ANTERIOR APPROACH;  Surgeon: Maude KANDICE Herald, MD;  Location: [facility];  Service: Orthopedics;  Laterality: Right;   TOTAL HIP ARTHROPLASTY Left 08/21/2015   TOTAL HIP ARTHROPLASTY Left 08/21/2015   Procedure: TOTAL HIP ARTHROPLASTY ANTERIOR APPROACH AND RIGHT TRIGGER THUMB RELEASE;  Surgeon: Maude Herald, MD;  Location: [facility];  Service: Orthopedics;  Laterality: Left;   Social History   Socioeconomic History   Marital status: Single    Spouse name: Not on file   Number of children: 0   Years of education: Not on file   Highest education level: Not  on file  Occupational History   Not on file  Tobacco Use   Smoking status: Former    Current packs/day: 1.00    Average packs/day: 1 pack/day for 35.0 years (35.0 ttl pk-yrs)    Types: Cigarettes   Smokeless tobacco: Never  Substance and Sexual Activity   Alcohol use: Yes    Comment: 08/17/2012 couple mixed drinks; couple times/month; sometimes it's beer   Drug use: No   Sexual activity: Not Currently  Other Topics Concern   Not on file  Social History Narrative   Right handed   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 story 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   Sports Administrator   Social Drivers of Health   Financial Resource Strain: Not on file  Food Insecurity: Not  on file  Transportation Needs: Not on file  Physical Activity: Not on file  Stress: Not on file  Social Connections: Unknown (01/13/2022)   Received from [facility]   Social Network    Social Network: Not on file   Allergies  Allergen Reactions   Tramadol Nausea And Vomiting    couldn't stop vomiting (08/17/2012)   Benzoyl Peroxide Itching and Rash   Betadine [Povidone Iodine] Rash   Family History  Problem Relation Age of Onset   Diabetes Mother    Heart disease Mother    Cancer Maternal Grandmother        pancreatic   Cancer Cousin        pancreatic   Parkinson's disease Maternal Grandfather     Current Outpatient Medications (Endocrine & Metabolic):    levothyroxine  (SYNTHROID ) 100 MCG tablet, Take 100 mcg by mouth daily before breakfast.   metFORMIN (GLUCOPHAGE) 500 MG tablet, Take 1,000 mg by mouth 2 (two) times daily with a meal.  Current Outpatient Medications (Cardiovascular):    atenolol  (TENORMIN ) 25 MG tablet, Take 1 tablet (25 mg total) by mouth daily.   ezetimibe  (ZETIA ) 10 MG tablet, Take 1 tablet (10 mg total) by mouth daily.   lisinopril  (ZESTRIL ) 5 MG tablet, Take 1 tablet (5 mg total) by mouth daily.   nitroGLYCERIN  (NITROSTAT ) 0.4 MG SL tablet, Place 1 tablet (0.4 mg total) under the tongue every 5 (five) minutes as needed for chest pain. Reported on 02/11/2016   rosuvastatin  (CRESTOR ) 20 MG tablet, Take 1 tablet (20 mg total) by mouth every other day.  Current Outpatient Medications (Respiratory):    albuterol  (VENTOLIN  HFA) 108 (90 Base) MCG/ACT inhaler, Inhale 2 puffs into the lungs every 4 (four) hours as needed for wheezing or shortness of breath.   umeclidinium-vilanterol (ANORO ELLIPTA ) 62.5-25 MCG/ACT AEPB, Inhale 1 puff into the lungs daily.  Current Outpatient Medications (Analgesics):    aspirin  EC 81 MG tablet, Take 1 tablet (81 mg total) by mouth daily.   meloxicam  (MOBIC ) 15 MG tablet, Take 1 tablet (15 mg total) by mouth  daily.  Current Outpatient Medications (Hematological):    cyanocobalamin (,VITAMIN B-12,) 1000 MCG/ML injection, Inject 1,000 mcg into the muscle every 30 (thirty) days. Reported on 02/11/2016   IRON PO, Take 1 tablet by mouth at bedtime as needed. When feeling tired  Current Outpatient Medications (Other):    ACCU-CHEK GUIDE test strip, USE 1 STRIP TO CHECK GLUCOSE ONCE DAILY FOR 100 DAYS   Accu-Chek Softclix Lancets lancets, SMARTSIG:Topical   amoxicillin (AMOXIL) 500 MG capsule, Take 500 mg by mouth as directed. Takes for dental cleaning or any type of procedures may have   BIOTIN PO, Take 1 tablet by  mouth daily.   cholecalciferol  (VITAMIN D ) 1000 units tablet, Take 1,000 Units by mouth daily.    clobetasol ointment (TEMOVATE) 0.05 %, Apply topically.   Coenzyme Q10 (COQ10 PO), Take 1 tablet by mouth daily.   gabapentin  (NEURONTIN ) 300 MG capsule, Take 1 capsule (300 mg total) by mouth at bedtime.   Omega-3 Fatty Acids (FISH OIL PO), Take 1 capsule by mouth daily.   sertraline (ZOLOFT) 50 MG tablet, Take 75 mg by mouth daily.   SODIUM FLUORIDE 5000 SENSITIVE 1.1-5 % GEL, Take by mouth as directed.   triamcinolone  (KENALOG ) 0.1 % paste, Use as directed 1 application in the mouth or throat as needed (FOR GUMS).    valACYclovir (VALTREX) 500 MG tablet, Take 500 mg by mouth daily as needed (Cold sore). Reported on 02/11/2016   zolpidem  (AMBIEN ) 10 MG tablet, Take 10 mg by mouth at bedtime as needed for sleep. Reported on 02/11/2016   Reviewed prior external information including notes and imaging from  primary care provider As well as notes that were available from care everywhere and other healthcare systems.  Past medical history, social, surgical and family history all reviewed in electronic medical record.  No pertanent information unless stated regarding to the chief complaint.   Review of Systems:  No headache, visual changes, nausea, vomiting, diarrhea, constipation, dizziness,  abdominal pain, skin rash, fevers, chills, night sweats, weight loss, swollen lymph nodes, body aches, joint swelling, chest pain, shortness of breath, mood changes. POSITIVE muscle aches  Objective  Blood pressure 108/70, pulse (!) 52, height 5' 4 (1.626 m), weight 141 lb (64 kg), SpO2 98%.   General: No apparent distress alert and oriented x3 mood and affect normal, dressed appropriately.  HEENT: Pupils equal, extraocular movements intact  Respiratory: Patient's speak in full sentences and does not appear short of breath  Cardiovascular: No lower extremity edema, non tender, no erythema   Foot exam shows patient does have significant breakdown of the transverse arch.  Patient does have some overpronation of the hindfoot minorly.  Patient that the distal inner phalangeal area does have what appears to be some mild versus on the plantar aspect noted that are very tender to palpation.  Negative squeeze test of the foot noted.  No inflammation of the joint space.  [id number]; 15 additional minutes spent for Therapeutic exercises as stated in above notes.  This included exercises focusing on stretching, strengthening, with significant focus on eccentric aspects.   Long term goals include an improvement in range of motion, strength, endurance as well as avoiding reinjury. Patient's frequency would include in 1-2 times a day, 3-5 times a week for a duration of 6-12 weeks. Exercises for the foot include:  Stretches to help lengthen the lower leg and plantar fascia areas Theraband exercises for the lower leg and ankle to help strengthen the surrounding area- dorsiflexion, plantarflexion, inversion, eversion Massage rolling on the plantar surface of the foot with a frozen bottle, tennis ball or golf ball Towel or marble pick-ups to strengthen the plantar surface of the foot Weight bearing exercises to increase balance and overall stability    Proper technique shown and discussed handout in great detail with  ATC.  All questions were discussed and answered.     Impression and Recommendations:     The above documentation has been reviewed and is accurate and complete Tiyona Desouza M Chauntay Paszkiewicz, DO

## 2023-10-08 ENCOUNTER — Ambulatory Visit: Payer: 59 | Admitting: Family Medicine

## 2023-10-08 ENCOUNTER — Encounter: Payer: Self-pay | Admitting: Family Medicine

## 2023-10-08 VITALS — BP 108/70 | HR 52 | Ht 64.0 in | Wt 141.0 lb

## 2023-10-08 DIAGNOSIS — M7752 Other enthesopathy of left foot: Secondary | ICD-10-CM | POA: Diagnosis not present

## 2023-10-08 DIAGNOSIS — M7750 Other enthesopathy of unspecified foot: Secondary | ICD-10-CM | POA: Insufficient documentation

## 2023-10-08 DIAGNOSIS — M7751 Other enthesopathy of right foot: Secondary | ICD-10-CM | POA: Diagnosis not present

## 2023-10-08 MED ORDER — MELOXICAM 15 MG PO TABS: 15.0000 mg | ORAL_TABLET | Freq: Every day | ORAL | 0 refills | Status: AC

## 2023-10-08 NOTE — Patient Instructions (Signed)
 Meloxicam  15mg  for 10 days then as needed Transvere arch Spenco Total Support Orthotics Original Recovery sandals See me in 2 months

## 2023-10-08 NOTE — Assessment & Plan Note (Signed)
 Patient does have forefoot bursitis noted.  This is causing some metatarsalgia.  We discussed that there is breakdown of the transverse arch that is also contributing.  Discussed with patient that icing regimen, home exercises, which activities to do and which ones to avoid.  Discussed proper shoes and avoiding being barefoot.  Discussed the importance of a metatarsal pad.  Follow-up again in 6 to 8 weeks worsening pain consider formal physical therapy and custom orthotics may be potentially ultrasound and injections as well.

## 2023-12-07 NOTE — Progress Notes (Unsigned)
 [facility] [address] [place] [address] Phone: [phone] Subjective:   Bruce Donath, am serving as a scribe for Dr. Antoine Primas.  I'm seeing this patient by the request  of:  Merri Brunette, MD  CC: bilateral foot pain   [id number]  10/08/2023 Patient does have forefoot bursitis noted.  This is causing some metatarsalgia.  We discussed that there is breakdown of the transverse arch that is also contributing.  Discussed with patient that icing regimen, home exercises, which activities to do and which ones to avoid.  Discussed proper shoes and avoiding being barefoot.  Discussed the importance of a metatarsal pad.  Follow-up again in 6 to 8 weeks worsening pain consider formal physical therapy and custom orthotics may be potentially ultrasound and injections as well.     Update 12/08/2023 Jassmin KENSLEIGH GATES is a 63 y.o. female coming in with complaint of B foot pain.  Patient was seen previously for forefoot bursitis.  Discussed over-the-counter medications, over-the-counter orthotics, added metatarsal pads.  Patient states that her pain has improved but it is still present. R>L.       Past Medical History:  Diagnosis Date   Allergic rhinitis    Anginal pain (HCC) 2001   Breast cancer (HCC)    invasive ductal carcinoma right breast; ER, PR + and HER 2 -   Chronic neck pain    hx of   Diabetes mellitus without complication (HCC)    DJD (degenerative joint disease) of hip    bil hip   GERD (gastroesophageal reflux disease)    hx of    Heart attack [facility]) 2001   Dr. Isabel Caprice   History of radiation therapy 09/16/12- 10/19/12   right breast   Hyperlipidemia    Hypertension    [facility]   Hypothyroidism    Migraines    "last one was 1990's" (08/17/2012)   Neuromuscular disorder (HCC)    carpal tunnel right hand   Osteoporosis    Pernicious anemia    Personal history of radiation therapy    Sickle cell  trait (HCC)    Sleep apnea    does use a cpap   Stroke (HCC)    Tobacco abuse    Wears glasses    Past Surgical History:  Procedure Laterality Date   BREAST BIOPSY Right 2013   BREAST BIOPSY Right 2019   BREAST LUMPECTOMY Right 2013   COLONOSCOPY     CORONARY ANGIOPLASTY WITH STENT PLACEMENT  09/02/1999   "1" (08/17/2012)   DORSAL COMPARTMENT RELEASE Left 08/18/2014   Procedure: LEFT FIRST RELEASE DORSAL COMPARTMENT (DEQUERVAIN);  Surgeon: Velna Ochs, MD;  Location: [facility];  Service: Orthopedics;  Laterality: Left;   NASAL SINUS SURGERY  09/01/1988   PARTIAL MASTECTOMY WITH NEEDLE LOCALIZATION AND AXILLARY SENTINEL LYMPH NODE BX  07/15/2012   Procedure: PARTIAL MASTECTOMY WITH NEEDLE LOCALIZATION AND AXILLARY SENTINEL LYMPH NODE BX;  Surgeon: Ernestene Mention, MD;  Location: [facility] OR;  Service: General;  Laterality: Right;   TOTAL HIP ARTHROPLASTY  08/17/2012   "right" (08/17/2012)   TOTAL HIP ARTHROPLASTY  08/17/2012   Procedure: TOTAL HIP ARTHROPLASTY ANTERIOR APPROACH;  Surgeon: Velna Ochs, MD;  Location: [facility] OR;  Service: Orthopedics;  Laterality: Right;   TOTAL HIP ARTHROPLASTY Left 08/21/2015   TOTAL HIP ARTHROPLASTY Left 08/21/2015   Procedure: TOTAL HIP ARTHROPLASTY ANTERIOR APPROACH AND RIGHT TRIGGER THUMB RELEASE;  Surgeon: Marcene Corning, MD;  Location: [facility] OR;  Service: Orthopedics;  Laterality: Left;   Social History   Socioeconomic History   Marital status: Single    Spouse name: Not on file   Number of children: 0   Years of education: Not on file   Highest education level: Not on file  Occupational History   Not on file  Tobacco Use   Smoking status: Former    Current packs/day: 1.00    Average packs/day: 1 pack/day for 35.0 years (35.0 ttl pk-yrs)    Types: Cigarettes   Smokeless tobacco: Never  Substance and Sexual Activity   Alcohol use: Yes    Comment: 08/17/2012 "couple mixed drinks; couple times/month; sometimes it's beer"    Drug use: No   Sexual activity: Not Currently  Other Topics Concern   Not on file  Social History Narrative   Right handed   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   Social Drivers of Health   Financial Resource Strain: Not on file  Food Insecurity: Not on file  Transportation Needs: Not on file  Physical Activity: Not on file  Stress: Not on file  Social Connections: Unknown (01/13/2022)   [facility]   Social Network    Social Network: Not on file   Allergies  Allergen Reactions   Tramadol Nausea And Vomiting    "couldn't stop vomiting" (08/17/2012)   Benzoyl Peroxide Itching and Rash   Betadine [Povidone Iodine] Rash   Family History  Problem Relation Age of Onset   Diabetes Mother    Heart disease Mother    Cancer Maternal Grandmother        pancreatic   Cancer Cousin        pancreatic   Parkinson's disease Maternal Grandfather     Current Outpatient Medications (Endocrine & Metabolic):    levothyroxine (SYNTHROID) 100 MCG tablet, Take 100 mcg by mouth daily before breakfast.   metFORMIN (GLUCOPHAGE) 500 MG tablet, Take 1,000 mg by mouth 2 (two) times daily with a meal.  Current Outpatient Medications (Cardiovascular):    atenolol (TENORMIN) 25 MG tablet, Take 1 tablet (25 mg total) by mouth daily.   ezetimibe (ZETIA) 10 MG tablet, Take 1 tablet (10 mg total) by mouth daily.   lisinopril (ZESTRIL) 5 MG tablet, Take 1 tablet (5 mg total) by mouth daily.   nitroGLYCERIN (NITROSTAT) 0.4 MG SL tablet, Place 1 tablet (0.4 mg total) under the tongue every 5 (five) minutes as needed for chest pain. Reported on 02/11/2016   rosuvastatin (CRESTOR) 20 MG tablet, Take 1 tablet (20 mg total) by mouth every other day.  Current Outpatient Medications (Respiratory):    albuterol (VENTOLIN HFA) 108 (90 Base) MCG/ACT inhaler, Inhale 2 puffs into the lungs every 4 (four) hours as needed for wheezing or shortness of  breath.   umeclidinium-vilanterol (ANORO ELLIPTA) 62.5-25 MCG/ACT AEPB, Inhale 1 puff into the lungs daily.  Current Outpatient Medications (Analgesics):    aspirin EC 81 MG tablet, Take 1 tablet (81 mg total) by mouth daily.   meloxicam (MOBIC) 15 MG tablet, Take 1 tablet (15 mg total) by mouth daily.  Current Outpatient Medications (Hematological):    cyanocobalamin (,VITAMIN B-12,) 1000 MCG/ML injection, Inject 1,000 mcg into the muscle every 30 (thirty) days. Reported on 02/11/2016   IRON PO, Take 1 tablet by mouth at bedtime as needed. When feeling tired  Current Outpatient Medications (Other):    ACCU-CHEK GUIDE test strip, USE  1 STRIP TO CHECK GLUCOSE ONCE DAILY FOR 100 DAYS   Accu-Chek Softclix Lancets lancets, SMARTSIG:Topical   amoxicillin (AMOXIL) 500 MG capsule, Take 500 mg by mouth as directed. Takes for dental cleaning or any type of procedures may have   BIOTIN PO, Take 1 tablet by mouth daily.   cholecalciferol (VITAMIN D) 1000 units tablet, Take 1,000 Units by mouth daily.    clobetasol ointment (TEMOVATE) 0.05 %, Apply topically.   Coenzyme Q10 (COQ10 PO), Take 1 tablet by mouth daily.   gabapentin (NEURONTIN) 300 MG capsule, Take 1 capsule (300 mg total) by mouth at bedtime.   Omega-3 Fatty Acids (FISH OIL PO), Take 1 capsule by mouth daily.   sertraline (ZOLOFT) 50 MG tablet, Take 75 mg by mouth daily.   SODIUM FLUORIDE 5000 SENSITIVE 1.1-5 % GEL, Take by mouth as directed.   triamcinolone (KENALOG) 0.1 % paste, Use as directed 1 application in the mouth or throat as needed (FOR GUMS).    valACYclovir (VALTREX) 500 MG tablet, Take 500 mg by mouth daily as needed (Cold sore). Reported on 02/11/2016   zolpidem (AMBIEN) 10 MG tablet, Take 10 mg by mouth at bedtime as needed for sleep. Reported on 02/11/2016   Reviewed prior external information including notes and imaging from  primary care provider As well as notes that were available from care everywhere and other  healthcare systems.  Past medical history, social, surgical and family history all reviewed in electronic medical record.  No pertanent information unless stated regarding to the chief complaint.   Review of Systems:  No headache, visual changes, nausea, vomiting, diarrhea, constipation, dizziness, abdominal pain, skin rash, fevers, chills, night sweats, weight loss, swollen lymph nodes, body aches, joint swelling, chest pain, shortness of breath, mood changes. POSITIVE muscle aches  Objective  Blood pressure 118/70, pulse (!) 45, height 5\' 4"  (1.626 m), weight 137 lb (62.1 kg), SpO2 98%.   General: No apparent distress alert and oriented x3 mood and affect normal, dressed appropriately.  HEENT: Pupils equal, extraocular movements intact  Respiratory: Patient's speak in full sentences and does not appear short of breath  Cardiovascular: No lower extremity edema, non tender, no erythema  Foot exam shows patient does have swelling noted between the first and second toes with some hallux limitus noted.  This is on the right foot.  Left foot tenderness between the toes 2 through 4 more on the plantar aspect.  Procedure: Real-time Ultrasound Guided Injection of right first MTP Device: GE Logiq Q7 Ultrasound guided injection is preferred based studies that show increased duration, increased effect, greater accuracy, decreased procedural pain, increased response rate, and decreased cost with ultrasound guided versus blind injection.  Verbal informed consent obtained.  Time-out conducted.  Noted no overlying erythema, induration, or other signs of local infection.  Skin prepped in a sterile fashion.  Local anesthesia: Topical Ethyl chloride.  With sterile technique and under real time ultrasound guidance: With a 25-gauge half inch needle injected into the right first MTP bursa sac with a total of 0.5 cc of 0.5% Marcaine and 0.5 cc of Kenalog 40 mg/mL. Completed without difficulty  Pain immediately  resolved suggesting accurate placement of the medication.  Advised to call if fevers/chills, erythema, induration, drainage, or persistent bleeding.  Impression: Technically successful ultrasound guided injection.     Impression and Recommendations:     The above documentation has been reviewed and is accurate and complete Judi Saa, DO

## 2023-12-08 ENCOUNTER — Other Ambulatory Visit: Payer: Self-pay

## 2023-12-08 ENCOUNTER — Encounter: Payer: Self-pay | Admitting: Family Medicine

## 2023-12-08 ENCOUNTER — Ambulatory Visit: Payer: 59 | Admitting: Family Medicine

## 2023-12-08 VITALS — BP 118/70 | HR 45 | Ht 64.0 in | Wt 137.0 lb

## 2023-12-08 DIAGNOSIS — M79671 Pain in right foot: Secondary | ICD-10-CM | POA: Diagnosis not present

## 2023-12-08 DIAGNOSIS — M25474 Effusion, right foot: Secondary | ICD-10-CM | POA: Diagnosis not present

## 2023-12-08 DIAGNOSIS — M7751 Other enthesopathy of right foot: Secondary | ICD-10-CM

## 2023-12-08 DIAGNOSIS — M7752 Other enthesopathy of left foot: Secondary | ICD-10-CM

## 2023-12-08 DIAGNOSIS — M79672 Pain in left foot: Secondary | ICD-10-CM | POA: Diagnosis not present

## 2023-12-08 NOTE — Patient Instructions (Addendum)
 Injected foot today Have appt in 5-6 weeks  If this works we can do other side sooner

## 2023-12-08 NOTE — Assessment & Plan Note (Signed)
 Continue the proper shoes.  May need custom orthotics but patient does like to use narrow shoes and some of the customs would be too thick.

## 2023-12-08 NOTE — Assessment & Plan Note (Signed)
 Injection given today and tolerated the procedure well, discussed icing regimen and home exercises, which activities to do and which ones to avoid.  Discussed proper shoes.  Hopeful that this will make a difference.  If not advanced imaging or formal physical therapy would be needed.

## 2023-12-21 ENCOUNTER — Emergency Department (HOSPITAL_COMMUNITY)

## 2023-12-21 ENCOUNTER — Telehealth: Payer: Self-pay | Admitting: Internal Medicine

## 2023-12-21 ENCOUNTER — Encounter (HOSPITAL_COMMUNITY): Payer: Self-pay | Admitting: Emergency Medicine

## 2023-12-21 ENCOUNTER — Emergency Department (HOSPITAL_COMMUNITY)
Admission: EM | Admit: 2023-12-21 | Discharge: 2023-12-22 | Discharge status: Against Medical Advice | Attending: Emergency Medicine | Admitting: Emergency Medicine

## 2023-12-21 DIAGNOSIS — R110 Nausea: Secondary | ICD-10-CM | POA: Diagnosis not present

## 2023-12-21 DIAGNOSIS — R079 Chest pain, unspecified: Secondary | ICD-10-CM | POA: Insufficient documentation

## 2023-12-21 DIAGNOSIS — R61 Generalized hyperhidrosis: Secondary | ICD-10-CM | POA: Insufficient documentation

## 2023-12-21 DIAGNOSIS — Z5321 Procedure and treatment not carried out due to patient leaving prior to being seen by health care provider: Secondary | ICD-10-CM | POA: Insufficient documentation

## 2023-12-21 DIAGNOSIS — Z7982 Long term (current) use of aspirin: Secondary | ICD-10-CM | POA: Diagnosis not present

## 2023-12-21 LAB — BASIC METABOLIC PANEL WITH GFR
Anion gap: 7 (ref 5–15)
BUN: 10 mg/dL (ref 8–23)
CO2: 24 mmol/L (ref 22–32)
Calcium: 9.8 mg/dL (ref 8.9–10.3)
Chloride: 107 mmol/L (ref 98–111)
Creatinine, Ser: 0.84 mg/dL (ref 0.44–1.00)
GFR, Estimated: >60 mL/min (ref >60)
Glucose, Bld: 109 mg/dL — ABNORMAL HIGH (ref 70–99)
Potassium: 4 mmol/L (ref 3.5–5.1)
Sodium: 138 mmol/L (ref 135–145)

## 2023-12-21 LAB — CBC WITH DIFFERENTIAL/PLATELET
Abs Immature Granulocytes: 0.03 10*3/uL (ref 0.00–0.07)
Basophils Absolute: 0 10*3/uL (ref 0.0–0.1)
Basophils Relative: 1 %
Eosinophils Absolute: 0.2 10*3/uL (ref 0.0–0.5)
Eosinophils Relative: 2 %
HCT: 33.9 % — ABNORMAL LOW (ref 36.0–46.0)
Hemoglobin: 10.4 g/dL — ABNORMAL LOW (ref 12.0–15.0)
Immature Granulocytes: 0 %
Lymphocytes Relative: 21 %
Lymphs Abs: 1.7 10*3/uL (ref 0.7–4.0)
MCH: 22.1 pg — ABNORMAL LOW (ref 26.0–34.0)
MCHC: 30.7 g/dL (ref 30.0–36.0)
MCV: 72 fL — ABNORMAL LOW (ref 80.0–100.0)
Monocytes Absolute: 0.6 10*3/uL (ref 0.1–1.0)
Monocytes Relative: 7 %
Neutro Abs: 5.6 10*3/uL (ref 1.7–7.7)
Neutrophils Relative %: 69 %
Platelets: 267 10*3/uL (ref 150–400)
RBC: 4.71 MIL/uL (ref 3.87–5.11)
RDW: 18.4 % — ABNORMAL HIGH (ref 11.5–15.5)
WBC: 8.1 10*3/uL (ref 4.0–10.5)
nRBC: 0 % (ref 0.0–0.2)

## 2023-12-21 LAB — TROPONIN I (HIGH SENSITIVITY)
Troponin I (High Sensitivity): 5 ng/L (ref <18)
Troponin I (High Sensitivity): 5 ng/L (ref <18)

## 2023-12-21 NOTE — ED Triage Notes (Addendum)
 Mid sternal Chest pain started yesterday, sweating, nausea no SOB. Took 1 nitroglycerin  and 2 baby aspirin  1 hour ago and pain has improved

## 2023-12-21 NOTE — ED Notes (Signed)
 Pt left.

## 2023-12-21 NOTE — Telephone Encounter (Signed)
 Pt c/o of Chest Pain: STAT if active CP, including tightness, pressure, jaw pain, radiating pain to shoulder/upper arm/back, CP unrelieved by Nitro. Symptoms reported of SOB, nausea, vomiting, sweating.  1. Are you having CP right now?   Yes  2. Are you experiencing any other symptoms (ex. SOB, nausea, vomiting, sweating)?   Sweating  3. Is your CP continuous or coming and going?   Coming and going  4. Have you taken Nitroglycerin ?  Yes - 5 minutes ago  5. How long have you been experiencing CP?   Yesterday   6. If NO CP at time of call then end call with telling Pt to call back or call 911 if Chest pain returns prior to return call from triage team.   Patient is concerned she has been having chest pains since yesterday and wants advice on next steps.

## 2023-12-21 NOTE — Telephone Encounter (Signed)
 Spoke with pt and has had 3 episodes of chest pain 2 yesterday first episode pt was laying down  Pt had taken NTG with relief and pain returned in 2 hours pt took 2 baby asa and ntg and pain resolved . Pt had another episode today and had to take NTG with relief Appt has been made with Charles Connor NP for 01/07/24 at 2:45 pm Instructed pt if has any more S/S or worsening S/S to go to ED for eval and tx Pt verbalized understanding ./cy

## 2023-12-21 NOTE — ED Provider Triage Note (Signed)
 Emergency Medicine Provider Triage Evaluation Note  Amber [name] , a 62 y.o. female  was evaluated in triage.  Pt complains of intermittent chest pain for the past 3 days.  Pain is located in the center of the chest, does not radiate anywhere else.  Pain is nonexertional, nonpleuritic.  Denies any nausea, vomiting, shortness of breath.  She reports taking nitroglycerin  and 2 baby aspirin 's which did improve her pain.  Review of Systems  Positive: As above Negative: As above  Physical Exam  BP (!) 131/54   Pulse (!) 57   Temp 98.5 F (36.9 C) (Oral)   Resp 18   SpO2 98%  Gen:   Awake, no distress   Resp:  Normal effort  MSK:   Moves extremities without difficulty    Medical Decision Making  Medically screening exam initiated at 5:58 PM.  Appropriate orders placed.  Amber [name] was informed that the remainder of the evaluation will be completed by another provider, this initial triage assessment does not replace that evaluation, and the importance of remaining in the ED until their evaluation is complete.     Rexie Catena, PA-C 12/21/23 1758

## 2023-12-22 NOTE — ED Notes (Signed)
 Pt name called no answer. See a note in the chart stating pt left around 9 pm

## 2024-01-01 ENCOUNTER — Other Ambulatory Visit: Payer: Self-pay | Admitting: Family Medicine

## 2024-01-01 DIAGNOSIS — Z1231 Encounter for screening mammogram for malignant neoplasm of breast: Secondary | ICD-10-CM

## 2024-01-04 ENCOUNTER — Ambulatory Visit
Admission: RE | Admit: 2024-01-04 | Discharge: 2024-01-04 | Disposition: A | Discharge status: Home / Self Care | Source: Ambulatory Visit | Attending: Family Medicine | Admitting: Family Medicine

## 2024-01-04 DIAGNOSIS — Z1231 Encounter for screening mammogram for malignant neoplasm of breast: Secondary | ICD-10-CM

## 2024-01-06 NOTE — Progress Notes (Addendum)
 " Cardiology Office Note    Patient Name: Amber [name] Date of Encounter: 01/06/2024  Primary Care Provider:  Claudene Pellet, MD Primary Cardiologist:  Vina Gull, MD Primary Electrophysiologist: None   Past Medical History    Past Medical History:  Diagnosis Date   Allergic rhinitis    Anginal pain (HCC) 2001   Breast cancer (HCC)    invasive ductal carcinoma right breast; ER, PR + and HER 2 -   Chronic neck pain    hx of   Diabetes mellitus without complication (HCC)    DJD (degenerative joint disease) of hip    bil hip   GERD (gastroesophageal reflux disease)    hx of    Heart attack [facility]) 2001   Dr. Christi   History of radiation therapy 09/16/12- 10/19/12   right breast   Hyperlipidemia    Hypertension    [facility]. [facility]   Hypothyroidism    Migraines    last one was 1990's (08/17/2012)   Neuromuscular disorder (HCC)    carpal tunnel right hand   Osteoporosis    Pernicious anemia    Personal history of radiation therapy    Sickle cell trait (HCC)    Sleep apnea    does use a cpap   Stroke (HCC)    Tobacco abuse    Wears glasses     History of Present Illness  Amber [name] is a 63 y.o. female with a PMH of CAD s/p anterior MI 2001 treated with DES to LAD, HTN, HLD, OSA (on CPAP), former tobacco abuse stopped in 2018, hypothyroidism, GERD, DM type II who presents today for complaint of chest pain.  Amber [name] was followed previously by Dr. Dann for management of CAD.  She has a history of anterior MI in 2001 with stent placed to the LAD by Dr. Annis.  She was seen in 12/2021 with complaint of dyspnea on exertion.  She underwent a TTE that showed normal EF with normal valvular function.  She underwent a nuclear stress test that was negative for ischemia.  She had atenolol  decreased to 12.5 mg due to sinus bradycardia.  She was last seen by Dr. Dann on 04/2022 and was advised to have pulmonary function test to evaluate cause of  SOB.  She was advised if pulmonary function tests were normal then left heart cath would be pursued.  She was seen by Dr. Gull on 08/24/2023 to establish care after Dr. Dann relocated to [place] .  She had PFTs completed that showed some obstruction and was referred to the pulmonary clinic.  She had retired in November 2024 and reported no significant shortness of breath or chest pain during visit.  She was seen in the ED on 12/21/23 with complaint of midsternal chest pain with sweating and nausea but no shortness of breath.  She took 1 nitroglycerin  and 2 baby aspirin  with improvement to pain. Troponins were completed and were normal with CBC showing anemia consistent with chronic pernicious anemia and normal renal function and electrolytes on BMET.  Amber [name] presents today for post ED follow-up. On December 21, 2023, she experienced mid-sternal chest pain accompanied by sweating and nausea, prompting a visit to the emergency room. She took nitroglycerin  and two baby aspirin , which alleviated the pain. Troponin levels were normal, but anemia was detected. She has a history of pernicious anemia. She retired in 2024 and exercises regularly, attending the gym at least four days a week without difficulty  in raising her heart rate during exercise. She monitors her blood sugar daily but does not regularly check her heart rate or blood pressure unless feeling unwell. Her current medications include atenolol , taken in the morning. She recently experienced chest pain, which she attributes to consuming cinnamon Altoids, suspecting they may have caused reflux. She has no history of reflux but has decided to avoid the mints. She has nitroglycerin  available, which was refilled after discovering her previous supply was expired. No current chest pain, shortness of breath, or dizziness. She occasionally experiences fatigue after exercise, which improves with rest. Patient denies chest pain, palpitations, dyspnea, PND,  orthopnea, nausea, vomiting, dizziness, syncope, edema, weight gain, or early satiety.  Discussed the use of AI scribe software for clinical note transcription with the patient, who gave verbal consent to proceed.  History of Present Illness    Review of Systems  Please see the history of present illness.    All other systems reviewed and are otherwise negative except as noted above.  Physical Exam    Wt Readings from Last 3 Encounters:  12/08/23 137 lb (62.1 kg)  10/08/23 141 lb (64 kg)  08/24/23 141 lb 6.4 oz (64.1 kg)   CD:Uyzmz were no vitals filed for this visit.,There is no height or weight on file to calculate BMI. GEN: Well nourished, well developed in no acute distress Neck: No JVD; No carotid bruits Pulmonary: Clear to auscultation without rales, wheezing or rhonchi  Cardiovascular: Normal rate. Regular rhythm. Normal S1. Normal S2.   Murmurs: There is no murmur.  ABDOMEN: Soft, non-tender, non-distended EXTREMITIES:  No edema; No deformity   EKG/LABS/ Recent Cardiac Studies   ECG personally reviewed by me today -none completed today  Risk Assessment/Calculations:          Lab Results  Component Value Date   WBC 8.1 12/21/2023   HGB 10.4 (L) 12/21/2023   HCT 33.9 (L) 12/21/2023   MCV 72.0 (L) 12/21/2023   PLT 267 12/21/2023   Lab Results  Component Value Date   CREATININE 0.84 12/21/2023   BUN 10 12/21/2023   NA 138 12/21/2023   K 4.0 12/21/2023   CL 107 12/21/2023   CO2 24 12/21/2023   Lab Results  Component Value Date   CHOL 115 01/04/2018   HDL 47 01/04/2018   LDLCALC 54 01/04/2018   TRIG 68 01/04/2018   CHOLHDL 2.4 01/04/2018    No results found for: HGBA1C Assessment & Plan    Assessment & Plan   1.  Chest pain: -Intermittent mid-sternal chest pain with sweating and nausea. Possible GERD due to cinnamon Altoids. No recurrence since ER visit. - Advise avoidance of cinnamon Altoids and reflux-inducing foods. - Monitor for recurrence  of chest pain.  2.  CAD: -s/p  anterior MI 2001 treated with DES to LAD with last ischemic evaluation completed on 01/2022 - Order PET CT stress test. - Discuss risks of PET CT stress test, including life-threatening complications. - Reassured regarding normal troponin levels and absence of recurrent chest pain.  3.  Essential hypertension: - Patient's blood pressure today was 124/72 - Continue atenolol  25 mg daily and lisinopril  5 mg daily  4.  Hyperlipidemia: - Patient's last LDL cholesterol was 82 - Continue Zetia  10 mg and Crestor  20 mg  5.  Sinus bradycardia Bradycardia likely related to atenolol . Heart rate improved to 58 bpm. No dizziness or fatigue. - Monitor heart rate and symptoms. Consider reducing atenolol  if heart rate falls below 50 bpm. -  Educate on signs of symptomatic bradycardia.  6.  Pulmonary emphysema: - Currently stable with no new complaints since previous ED visit.        Disposition: Follow-up with Vina Gull, MD as scheduled Informed Consent   Shared Decision Making/Informed Consent The risks [chest pain, shortness of breath, cardiac arrhythmias, dizziness, blood pressure fluctuations, myocardial infarction, stroke/transient ischemic attack, nausea, vomiting, allergic reaction, radiation exposure, metallic taste sensation and life-threatening complications (estimated to be 1 in 10,000)], benefits (risk stratification, diagnosing coronary artery disease, treatment guidance) and alternatives of a cardiac PET stress test were discussed in detail with Amber [name] and she agrees to proceed.      Signed, Wyn Raddle, Jackee Shove, NP 01/06/2024, 1:49 PM [facility] "

## 2024-01-07 ENCOUNTER — Ambulatory Visit: Attending: Nurse Practitioner | Admitting: Nurse Practitioner

## 2024-01-07 ENCOUNTER — Encounter: Payer: Self-pay | Admitting: Nurse Practitioner

## 2024-01-07 VITALS — BP 124/72 | HR 58 | Ht 64.0 in | Wt 136.0 lb

## 2024-01-07 DIAGNOSIS — I2510 Atherosclerotic heart disease of native coronary artery without angina pectoris: Secondary | ICD-10-CM | POA: Diagnosis not present

## 2024-01-07 DIAGNOSIS — I10 Essential (primary) hypertension: Secondary | ICD-10-CM

## 2024-01-07 DIAGNOSIS — R079 Chest pain, unspecified: Secondary | ICD-10-CM | POA: Diagnosis not present

## 2024-01-07 DIAGNOSIS — E118 Type 2 diabetes mellitus with unspecified complications: Secondary | ICD-10-CM | POA: Diagnosis not present

## 2024-01-07 DIAGNOSIS — E782 Mixed hyperlipidemia: Secondary | ICD-10-CM

## 2024-01-07 DIAGNOSIS — J439 Emphysema, unspecified: Secondary | ICD-10-CM

## 2024-01-07 DIAGNOSIS — R001 Bradycardia, unspecified: Secondary | ICD-10-CM

## 2024-01-07 NOTE — Patient Instructions (Addendum)
 Medication Instructions:  Your physician recommends that you continue on your current medications as directed. Please refer to the Current Medication list given to you today.  *If you need a refill on your cardiac medications before your next appointment, please call your pharmacy*  Testing/Procedures: Your provider has requested that you have a nuclear PET stress CT scan performed. This will be done at [facility]. A scheduler will call you to schedule this test. Please see instructions below under Other Instructions.  Follow-Up: At [facility], you and your health needs are our priority.  As part of our continuing mission to provide you with exceptional heart care, our providers are all part of one team.  This team includes your primary Cardiologist (physician) and Advanced Practice Providers or APPs (Physician Assistants and Nurse Practitioners) who all work together to provide you with the care you need, when you need it.  Your next appointment:   7 month(s)  The format for your next appointment:   In Person  Provider:   Ola Berger, MD{  We recommend signing up for the patient portal called "MyChart".  Sign up information is provided on this After Visit Summary.  MyChart is used to connect with patients for Virtual Visits (Telemedicine).  Patients are able to view lab/test results, encounter notes, upcoming appointments, etc.  Non-urgent messages can be sent to your provider as well.   To learn more about what you can do with MyChart, go to [email or web address].   Other Instructions    Please report to Radiology at the [facility] Main Entrance 30 minutes early for your test.  [address] [place], [place] [address]                         OR   Please report to Radiology at [facility] Main Entrance, medical mall, 30 mins prior to your test.  [address]  [place], [place]  How to Prepare for Your Cardiac PET/CT  Stress Test:  Nothing to eat or drink, except water, 3 hours prior to arrival time.  NO caffeine/decaffeinated products, or chocolate 12 hours prior to arrival. (Please note decaffeinated beverages (teas/coffees) still contain caffeine).  If you have caffeine within 12 hours prior, the test will need to be rescheduled.  Medication instructions: Do not take erectile dysfunction medications for 72 hours prior to test (sildenafil, tadalafil) Do not take nitrates (isosorbide mononitrate, Ranexa) the day before or day of test Do not take tamsulosin the day before or morning of test Hold theophylline containing medications for 12 hours. Hold Dipyridamole 48 hours prior to the test.  Diabetic Preparation: If able to eat breakfast prior to 3 hour fasting, you may take all medications, including your insulin. Do not worry if you miss your breakfast dose of insulin - start at your next meal. If you do not eat prior to 3 hour fast-Hold all diabetes (oral and insulin) medications. Patients who wear a continuous glucose monitor MUST remove the device prior to scanning.  You may take your remaining medications with water.  NO perfume, cologne or lotion on chest or abdomen area. FEMALES - Please avoid wearing dresses to this appointment.  Total time is 1 to 2 hours; you may want to bring reading material for the waiting time.  IF YOU THINK YOU MAY BE PREGNANT, OR ARE NURSING PLEASE INFORM THE TECHNOLOGIST.  In preparation for your appointment, medication and supplies will be  purchased.  Appointment availability is limited, so if you need to cancel or reschedule, please call the Radiology Department Scheduler at [phone] 24 hours in advance to avoid a cancellation fee of $100.00  What to Expect When you Arrive:  Once you arrive and check in for your appointment, you will be taken to a preparation room within the Radiology Department.  A technologist or Nurse will obtain your medical history, verify  that you are correctly prepped for the exam, and explain the procedure.  Afterwards, an IV will be started in your arm and electrodes will be placed on your skin for EKG monitoring during the stress portion of the exam. Then you will be escorted to the PET/CT scanner.  There, staff will get you positioned on the scanner and obtain a blood pressure and EKG.  During the exam, you will continue to be connected to the EKG and blood pressure machines.  A small, safe amount of a radioactive tracer will be injected in your IV to obtain a series of pictures of your heart along with an injection of a stress agent.    After your Exam:  It is recommended that you eat a meal and drink a caffeinated beverage to counter act any effects of the stress agent.  Drink plenty of fluids for the remainder of the day and urinate frequently for the first couple of hours after the exam.  Your doctor will inform you of your test results within 7-10 business days.  For more information and frequently asked questions, please visit our website: [email or web address]  For questions about your test or how to prepare for your test, please call: Cardiac Imaging Nurse Navigators Office: [phone]

## 2024-01-18 ENCOUNTER — Other Ambulatory Visit: Payer: Self-pay | Admitting: Neurology

## 2024-01-18 NOTE — Progress Notes (Signed)
 [facility] [address] [place] [address] Phone: [phone] Subjective:   Amber [name], am serving as a scribe for Dr. Ronnell Coins.  I'm seeing this patient by the request  of:  Faustina Hood, MD  CC: Left wrist pain  [id number]  12/08/2023 Continue the proper shoes.  May need custom orthotics but patient does like to use narrow shoes and some of the customs would be too thick.     Injection given today and tolerated the procedure well, discussed icing regimen and home exercises, which activities to do and which ones to avoid.  Discussed proper shoes.  Hopeful that this will make a difference.  If not advanced imaging or formal physical therapy would be needed.      Update 01/19/2024 Amber [name] is a 63 y.o. female coming in with complaint of R foot pain. Patient states that her foot is doing somewhat better.   C/o fall 2 weeks ago. Pain throughout dorsal aspect of wrist. Twisting off a top is painful.       Past Medical History:  Diagnosis Date   Allergic rhinitis    Anginal pain (HCC) 2001   Breast cancer (HCC)    invasive ductal carcinoma right breast; ER, PR + and HER 2 -   Chronic neck pain    hx of   Diabetes mellitus without complication (HCC)    DJD (degenerative joint disease) of hip    bil hip   GERD (gastroesophageal reflux disease)    hx of    Heart attack [facility]) 2001   Dr. Rainey Burden   History of radiation therapy 09/16/12- 10/19/12   right breast   Hyperlipidemia    Hypertension    [facility]. [facility]   Hypothyroidism    Migraines    "last one was 1990's" (08/17/2012)   Neuromuscular disorder (HCC)    carpal tunnel right hand   Osteoporosis    Pernicious anemia    Personal history of radiation therapy    Sickle cell trait (HCC)    Sleep apnea    does use a cpap   Stroke (HCC)    Tobacco abuse    Wears glasses    Past Surgical History:  Procedure Laterality Date   BREAST  BIOPSY Right 2013   BREAST BIOPSY Right 2019   BREAST LUMPECTOMY Right 2013   COLONOSCOPY     CORONARY ANGIOPLASTY WITH STENT PLACEMENT  09/02/1999   "1" (08/17/2012)   DORSAL COMPARTMENT RELEASE Left 08/18/2014   Procedure: LEFT FIRST RELEASE DORSAL COMPARTMENT (DEQUERVAIN);  Surgeon: Alphonzo Ask, MD;  Location: [facility];  Service: Orthopedics;  Laterality: Left;   NASAL SINUS SURGERY  09/01/1988   PARTIAL MASTECTOMY WITH NEEDLE LOCALIZATION AND AXILLARY SENTINEL LYMPH NODE BX  07/15/2012   Procedure: PARTIAL MASTECTOMY WITH NEEDLE LOCALIZATION AND AXILLARY SENTINEL LYMPH NODE BX;  Surgeon: Levert Ready, MD;  Location: [facility] OR;  Service: General;  Laterality: Right;   TOTAL HIP ARTHROPLASTY  08/17/2012   "right" (08/17/2012)   TOTAL HIP ARTHROPLASTY  08/17/2012   Procedure: TOTAL HIP ARTHROPLASTY ANTERIOR APPROACH;  Surgeon: Alphonzo Ask, MD;  Location: [facility] OR;  Service: Orthopedics;  Laterality: Right;   TOTAL HIP ARTHROPLASTY Left 08/21/2015   TOTAL HIP ARTHROPLASTY Left 08/21/2015   Procedure: TOTAL HIP ARTHROPLASTY ANTERIOR APPROACH AND RIGHT TRIGGER THUMB RELEASE;  Surgeon: Dayne Even, MD;  Location: [facility] OR;  Service: Orthopedics;  Laterality: Left;  Social History   Socioeconomic History   Marital status: Single    Spouse name: Not on file   Number of children: 0   Years of education: Not on file   Highest education level: Not on file  Occupational History   Not on file  Tobacco Use   Smoking status: Former    Current packs/day: 1.00    Average packs/day: 1 pack/day for 35.0 years (35.0 ttl pk-yrs)    Types: Cigarettes   Smokeless tobacco: Never  Substance and Sexual Activity   Alcohol use: Yes    Comment: 08/17/2012 "couple mixed drinks; couple times/month; sometimes it's beer"   Drug use: No   Sexual activity: Not Currently  Other Topics Concern   Not on file  Social History Narrative   Right handed   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 story home with  dog   Sports administrator   Social Drivers of Health   Financial Resource Strain: Not on file  Food Insecurity: Not on file  Transportation Needs: Not on file  Physical Activity: Not on file  Stress: Not on file  Social Connections: Unknown (01/13/2022)   Received from [facility]   Social Network    Social Network: Not on file   Allergies  Allergen Reactions   Tramadol Nausea And Vomiting    "couldn't stop vomiting" (08/17/2012)   Benzoyl Peroxide Itching and Rash   Betadine [Povidone Iodine] Rash   Family History  Problem Relation Age of Onset   Diabetes Mother    Heart disease Mother    Cancer Maternal Grandmother        pancreatic   Cancer Cousin        pancreatic   Parkinson's disease Maternal Grandfather     Current Outpatient Medications (Endocrine & Metabolic):    levothyroxine  (SYNTHROID ) 100 MCG tablet, Take 100 mcg by mouth daily before breakfast.   metFORMIN (GLUCOPHAGE) 500 MG tablet, Take 1,000 mg by mouth 2 (two) times daily with a meal.  Current Outpatient Medications (Cardiovascular):    atenolol  (TENORMIN ) 25 MG tablet, Take 1 tablet (25 mg total) by mouth daily.   ezetimibe  (ZETIA ) 10 MG tablet, Take 1 tablet (10 mg total) by mouth daily.   lisinopril  (ZESTRIL ) 5 MG tablet, Take 1 tablet (5 mg total) by mouth daily.   nitroGLYCERIN  (NITROSTAT ) 0.4 MG SL tablet, Place 1 tablet (0.4 mg total) under the tongue every 5 (five) minutes as needed for chest pain. Reported on 02/11/2016   rosuvastatin  (CRESTOR ) 20 MG tablet, Take 1 tablet (20 mg total) by mouth every other day.  Current Outpatient Medications (Respiratory):    albuterol  (VENTOLIN  HFA) 108 (90 Base) MCG/ACT inhaler, Inhale 2 puffs into the lungs every 4 (four) hours as needed for wheezing or shortness of breath.   umeclidinium-vilanterol (ANORO ELLIPTA ) 62.5-25 MCG/ACT AEPB, Inhale 1 puff into the lungs daily.  Current Outpatient Medications (Analgesics):    aspirin  EC  81 MG tablet, Take 1 tablet (81 mg total) by mouth daily.   meloxicam  (MOBIC ) 15 MG tablet, Take 1 tablet (15 mg total) by mouth daily.  Current Outpatient Medications (Hematological):    cyanocobalamin (,VITAMIN B-12,) 1000 MCG/ML injection, Inject 1,000 mcg into the muscle every 30 (thirty) days. Reported on 02/11/2016   IRON PO, Take 1 tablet by mouth at bedtime as needed. When feeling tired  Current Outpatient Medications (Other):    ACCU-CHEK GUIDE test strip, USE 1 STRIP TO CHECK GLUCOSE ONCE DAILY FOR 100 DAYS  Accu-Chek Softclix Lancets lancets, SMARTSIG:Topical   amoxicillin (AMOXIL) 500 MG capsule, Take 500 mg by mouth as directed. Takes for dental cleaning or any type of procedures may have   BIOTIN PO, Take 1 tablet by mouth daily.   cholecalciferol  (VITAMIN D ) 1000 units tablet, Take 1,000 Units by mouth daily.    clobetasol ointment (TEMOVATE) 0.05 %, Apply topically.   Coenzyme Q10 (COQ10 PO), Take 1 tablet by mouth daily.   gabapentin  (NEURONTIN ) 300 MG capsule, Take 1 capsule (300 mg total) by mouth at bedtime.   Omega-3 Fatty Acids (FISH OIL PO), Take 1 capsule by mouth daily.   sertraline (ZOLOFT) 50 MG tablet, Take 75 mg by mouth daily.   SODIUM FLUORIDE 5000 SENSITIVE 1.1-5 % GEL, Take by mouth as directed.   triamcinolone  (KENALOG ) 0.1 % paste, Use as directed 1 application in the mouth or throat as needed (FOR GUMS).    valACYclovir (VALTREX) 500 MG tablet, Take 500 mg by mouth daily as needed (Cold sore). Reported on 02/11/2016   zolpidem  (AMBIEN ) 10 MG tablet, Take 10 mg by mouth at bedtime as needed for sleep. Reported on 02/11/2016   Reviewed prior external information including notes and imaging from  primary care provider As well as notes that were available from care everywhere and other healthcare systems.  Past medical history, social, surgical and family history all reviewed in electronic medical record.  No pertanent information unless stated regarding to  the chief complaint.   Review of Systems:  No headache, visual changes, nausea, vomiting, diarrhea, constipation, dizziness, abdominal pain, skin rash, fevers, chills, night sweats, weight loss, swollen lymph nodes, body aches, joint swelling, chest pain, shortness of breath, mood changes. POSITIVE muscle aches  Objective  Blood pressure 112/62, pulse 68, height 5\' 4"  (1.626 m), weight 133 lb (60.3 kg), SpO2 98%.   General: No apparent distress alert and oriented x3 mood and affect normal, dressed appropriately.  HEENT: Pupils equal, extraocular movements intact  Respiratory: Patient's speak in full sentences and does not appear short of breath  Cardiovascular: No lower extremity edema, non tender, no erythema  Left wrist exam shows the patient does have some mild swelling noted.  Seems to be near the scaphoid bone.  Mild pain in the anatomical snuffbox.  Good grip strength noted.  Limited muscular skeletal ultrasound was performed and interpreted by Ronnell Coins, M  Limited send does show a cortical irregularity noted in the scaphoid bone.  Very difficult to tell if it is a fracture versus chronic changes.  Significant hypoechoic changes are noted at the scaphoid lunate area.  Moderate to severe arthritic changes of the [facility] joint noted. Impression: Injury to the scaphoid lunate joint as well as potential mid substance scaphoid injury   Impression and Recommendations:    The above documentation has been reviewed and is accurate and complete Issa Kosmicki M Dan Dissinger, DO

## 2024-01-19 ENCOUNTER — Other Ambulatory Visit: Payer: Self-pay

## 2024-01-19 ENCOUNTER — Ambulatory Visit: Admitting: Family Medicine

## 2024-01-19 ENCOUNTER — Encounter: Payer: Self-pay | Admitting: Family Medicine

## 2024-01-19 ENCOUNTER — Ambulatory Visit (INDEPENDENT_AMBULATORY_CARE_PROVIDER_SITE_OTHER)

## 2024-01-19 VITALS — BP 112/62 | HR 68 | Ht 64.0 in | Wt 133.0 lb

## 2024-01-19 DIAGNOSIS — M25532 Pain in left wrist: Secondary | ICD-10-CM

## 2024-01-19 DIAGNOSIS — S6992XA Unspecified injury of left wrist, hand and finger(s), initial encounter: Secondary | ICD-10-CM | POA: Diagnosis not present

## 2024-01-19 DIAGNOSIS — M79671 Pain in right foot: Secondary | ICD-10-CM

## 2024-01-19 NOTE — Patient Instructions (Signed)
 You have 14 days to return or exchange your brace Call [phone], then return the brace to our office Brace day and night for 3 weeks See you again in 3 weeks

## 2024-01-19 NOTE — Assessment & Plan Note (Signed)
 Patient did have a left wrist injury, on ultrasound as well as x-ray there is some cortical irregularity minorly noted in the mid substance of the scaphoid.  Patient has minorly tender in the area.  Due to patient's history of radiation, osteopenia do feel that to treat more conservatively.  She did get patient into a thumb spica to wear daily diet for the next 3 weeks.  Would like to recheck at that time.  Patient is doing well we will get her into a brace only for activities in time bands accordingly.  Discussed doubling up the vitamin D  at this time.  Patient is in agreement with the plan and will follow-up again in 3 weeks

## 2024-02-09 ENCOUNTER — Encounter: Payer: Self-pay | Admitting: Family Medicine

## 2024-02-09 ENCOUNTER — Ambulatory Visit: Admitting: Family Medicine

## 2024-02-09 ENCOUNTER — Other Ambulatory Visit: Payer: Self-pay

## 2024-02-09 VITALS — BP 106/60 | HR 75 | Ht 64.0 in

## 2024-02-09 DIAGNOSIS — M79671 Pain in right foot: Secondary | ICD-10-CM | POA: Diagnosis not present

## 2024-02-09 DIAGNOSIS — S6992XA Unspecified injury of left wrist, hand and finger(s), initial encounter: Secondary | ICD-10-CM

## 2024-02-09 NOTE — Assessment & Plan Note (Signed)
 Significant improvement noted.  The callus over the scaphoid bone does seem that it was a nondisplaced fracture but seems to be significantly improved at this time.  Patient should do very well overall.  Follow-up again in 6 to 8 weeks otherwise.

## 2024-02-09 NOTE — Progress Notes (Signed)
 [facility] [address] [place] [address] Phone: [phone] Subjective:   Amber [name] am a scribe for Dr. Felipe Horton.   I'm seeing this patient by the request  of:  Faustina Hood, MD  CC: Right foot and left wrist pain follow-up  [id number]  01/19/2024 Patient did have a left wrist injury, on ultrasound as well as x-ray there is some cortical irregularity minorly noted in the mid substance of the scaphoid.  Patient has minorly tender in the area.  Due to patient's history of radiation, osteopenia do feel that to treat more conservatively.  She did get patient into a thumb spica to wear daily diet for the next 3 weeks.  Would like to recheck at that time.  Patient is doing well we will get her into a brace only for activities in time bands accordingly.  Discussed doubling up the vitamin D  at this time.  Patient is in agreement with the plan and will follow-up again in 3 weeks     Updated 02/09/2024 Amber [name] is a 63 y.o. female coming in with complaint of wrist and foot pain. Patient states that it is better. Came into office with brace on. Foot is better as well.        Past Medical History:  Diagnosis Date   Allergic rhinitis    Anginal pain (HCC) 2001   Breast cancer (HCC)    invasive ductal carcinoma right breast; ER, PR + and HER 2 -   Chronic neck pain    hx of   Diabetes mellitus without complication (HCC)    DJD (degenerative joint disease) of hip    bil hip   GERD (gastroesophageal reflux disease)    hx of    Heart attack [facility]) 2001   Dr. Rainey Burden   History of radiation therapy 09/16/12- 10/19/12   right breast   Hyperlipidemia    Hypertension    [facility]. [facility]   Hypothyroidism    Migraines    "last one was 1990's" (08/17/2012)   Neuromuscular disorder (HCC)    carpal tunnel right hand   Osteoporosis    Pernicious anemia    Personal history of radiation therapy    Sickle cell trait  (HCC)    Sleep apnea    does use a cpap   Stroke (HCC)    Tobacco abuse    Wears glasses    Past Surgical History:  Procedure Laterality Date   BREAST BIOPSY Right 2013   BREAST BIOPSY Right 2019   BREAST LUMPECTOMY Right 2013   COLONOSCOPY     CORONARY ANGIOPLASTY WITH STENT PLACEMENT  09/02/1999   "1" (08/17/2012)   DORSAL COMPARTMENT RELEASE Left 08/18/2014   Procedure: LEFT FIRST RELEASE DORSAL COMPARTMENT (DEQUERVAIN);  Surgeon: Alphonzo Ask, MD;  Location: [facility];  Service: Orthopedics;  Laterality: Left;   NASAL SINUS SURGERY  09/01/1988   PARTIAL MASTECTOMY WITH NEEDLE LOCALIZATION AND AXILLARY SENTINEL LYMPH NODE BX  07/15/2012   Procedure: PARTIAL MASTECTOMY WITH NEEDLE LOCALIZATION AND AXILLARY SENTINEL LYMPH NODE BX;  Surgeon: Levert Ready, MD;  Location: [facility] OR;  Service: General;  Laterality: Right;   TOTAL HIP ARTHROPLASTY  08/17/2012   "right" (08/17/2012)   TOTAL HIP ARTHROPLASTY  08/17/2012   Procedure: TOTAL HIP ARTHROPLASTY ANTERIOR APPROACH;  Surgeon: Alphonzo Ask, MD;  Location: [facility] OR;  Service: Orthopedics;  Laterality: Right;   TOTAL HIP ARTHROPLASTY Left 08/21/2015  TOTAL HIP ARTHROPLASTY Left 08/21/2015   Procedure: TOTAL HIP ARTHROPLASTY ANTERIOR APPROACH AND RIGHT TRIGGER THUMB RELEASE;  Surgeon: Dayne Even, MD;  Location: [facility] OR;  Service: Orthopedics;  Laterality: Left;   Social History   Socioeconomic History   Marital status: Single    Spouse name: Not on file   Number of children: 0   Years of education: Not on file   Highest education level: Not on file  Occupational History   Not on file  Tobacco Use   Smoking status: Former    Current packs/day: 1.00    Average packs/day: 1 pack/day for 35.0 years (35.0 ttl pk-yrs)    Types: Cigarettes   Smokeless tobacco: Never  Substance and Sexual Activity   Alcohol use: Yes    Comment: 08/17/2012 "couple mixed drinks; couple times/month; sometimes it's beer"    Drug use: No   Sexual activity: Not Currently  Other Topics Concern   Not on file  Social History Narrative   Right handed   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   Social Drivers of Health   Financial Resource Strain: Not on file  Food Insecurity: Not on file  Transportation Needs: Not on file  Physical Activity: Not on file  Stress: Not on file  Social Connections: Unknown (01/13/2022)   [facility], [facility]   Social Network    Social Network: Not on file   Allergies  Allergen Reactions   Tramadol Nausea And Vomiting    "couldn't stop vomiting" (08/17/2012)   Benzoyl Peroxide Itching and Rash   Betadine [Povidone Iodine] Rash   Family History  Problem Relation Age of Onset   Diabetes Mother    Heart disease Mother    Cancer Maternal Grandmother        pancreatic   Cancer Cousin        pancreatic   Parkinson's disease Maternal Grandfather     Current Outpatient Medications (Endocrine & Metabolic):    levothyroxine  (SYNTHROID ) 100 MCG tablet, Take 100 mcg by mouth daily before breakfast.   metFORMIN (GLUCOPHAGE) 500 MG tablet, Take 1,000 mg by mouth 2 (two) times daily with a meal.  Current Outpatient Medications (Cardiovascular):    atenolol  (TENORMIN ) 25 MG tablet, Take 1 tablet (25 mg total) by mouth daily.   ezetimibe  (ZETIA ) 10 MG tablet, Take 1 tablet (10 mg total) by mouth daily.   lisinopril  (ZESTRIL ) 5 MG tablet, Take 1 tablet (5 mg total) by mouth daily.   nitroGLYCERIN  (NITROSTAT ) 0.4 MG SL tablet, Place 1 tablet (0.4 mg total) under the tongue every 5 (five) minutes as needed for chest pain. Reported on 02/11/2016   rosuvastatin  (CRESTOR ) 20 MG tablet, Take 1 tablet (20 mg total) by mouth every other day.  Current Outpatient Medications (Respiratory):    albuterol  (VENTOLIN  HFA) 108 (90 Base) MCG/ACT inhaler, Inhale 2 puffs into the lungs every 4 (four) hours as needed for wheezing or shortness of breath.    umeclidinium-vilanterol (ANORO ELLIPTA ) 62.5-25 MCG/ACT AEPB, Inhale 1 puff into the lungs daily.  Current Outpatient Medications (Analgesics):    aspirin  EC 81 MG tablet, Take 1 tablet (81 mg total) by mouth daily.   meloxicam  (MOBIC ) 15 MG tablet, Take 1 tablet (15 mg total) by mouth daily.  Current Outpatient Medications (Hematological):    cyanocobalamin (,VITAMIN B-12,) 1000 MCG/ML injection, Inject 1,000 mcg into the muscle every 30 (thirty) days. Reported on 02/11/2016   IRON PO, Take  1 tablet by mouth at bedtime as needed. When feeling tired  Current Outpatient Medications (Other):    ACCU-CHEK GUIDE test strip, USE 1 STRIP TO CHECK GLUCOSE ONCE DAILY FOR 100 DAYS   Accu-Chek Softclix Lancets lancets, SMARTSIG:Topical   amoxicillin (AMOXIL) 500 MG capsule, Take 500 mg by mouth as directed. Takes for dental cleaning or any type of procedures may have   BIOTIN PO, Take 1 tablet by mouth daily.   cholecalciferol  (VITAMIN D ) 1000 units tablet, Take 1,000 Units by mouth daily.    clobetasol ointment (TEMOVATE) 0.05 %, Apply topically.   Coenzyme Q10 (COQ10 PO), Take 1 tablet by mouth daily.   gabapentin  (NEURONTIN ) 300 MG capsule, TAKE 1 CAPSULE BY MOUTH EVERY DAY AT BEDTIME   Omega-3 Fatty Acids (FISH OIL PO), Take 1 capsule by mouth daily.   sertraline (ZOLOFT) 50 MG tablet, Take 75 mg by mouth daily.   SODIUM FLUORIDE 5000 SENSITIVE 1.1-5 % GEL, Take by mouth as directed.   triamcinolone  (KENALOG ) 0.1 % paste, Use as directed 1 application in the mouth or throat as needed (FOR GUMS).    valACYclovir (VALTREX) 500 MG tablet, Take 500 mg by mouth daily as needed (Cold sore). Reported on 02/11/2016   zolpidem  (AMBIEN ) 10 MG tablet, Take 10 mg by mouth at bedtime as needed for sleep. Reported on 02/11/2016   Reviewed prior external information including notes and imaging from  primary care provider As well as notes that were available from care everywhere and other healthcare  systems.  Past medical history, social, surgical and family history all reviewed in electronic medical record.  No pertanent information unless stated regarding to the chief complaint.   Review of Systems:  No headache, visual changes, nausea, vomiting, diarrhea, constipation, dizziness, abdominal pain, skin rash, fevers, chills, night sweats, weight loss, swollen lymph nodes, body aches, joint swelling, chest pain, shortness of breath, mood changes. POSITIVE muscle aches  Objective  Blood pressure 106/60, pulse 75, height 5\' 4"  (1.626 m), SpO2 97%.   General: No apparent distress alert and oriented x3 mood and affect normal, dressed appropriately.  HEENT: Pupils equal, extraocular movements intact  Respiratory: Patient's speak in full sentences and does not appear short of breath  Cardiovascular: No lower extremity edema, non tender, no erythema  Left wrist exam shows that patient does have some atrophy noted of the wrist from wearing the brace. Patient has good grip strength.  Nontender on exam.   Limited muscular skeletal ultrasound was performed and interpreted by Ronnell Coins, M  Limited ultrasound shows good hard callus formation over the mid substance of the scaphoid.  No hypoechoic changes in the area. Impression: Interval improvement noted.   Impression and Recommendations:    The above documentation has been reviewed and is accurate and complete Elsworth Ledin M Herberto Ledwell, DO

## 2024-02-17 ENCOUNTER — Encounter (HOSPITAL_COMMUNITY): Payer: Self-pay

## 2024-02-18 ENCOUNTER — Ambulatory Visit
Admission: RE | Admit: 2024-02-18 | Discharge: 2024-02-18 | Disposition: A | Discharge status: Home / Self Care | Payer: Self-pay | Source: Ambulatory Visit | Attending: Nurse Practitioner | Admitting: Nurse Practitioner

## 2024-02-18 DIAGNOSIS — R079 Chest pain, unspecified: Secondary | ICD-10-CM | POA: Diagnosis not present

## 2024-02-18 DIAGNOSIS — I700 Atherosclerosis of aorta: Secondary | ICD-10-CM | POA: Insufficient documentation

## 2024-02-18 DIAGNOSIS — I2510 Atherosclerotic heart disease of native coronary artery without angina pectoris: Secondary | ICD-10-CM | POA: Diagnosis not present

## 2024-02-18 DIAGNOSIS — J439 Emphysema, unspecified: Secondary | ICD-10-CM | POA: Insufficient documentation

## 2024-02-18 LAB — NM PET CT CARDIAC PERFUSION MULTI W/ABSOLUTE BLOODFLOW
LV dias vol: 74 mL (ref 46–106)
LV sys vol: 26 mL (ref 3.8–5.2)
MBFR: 3.22
Nuc Rest EF: 58 %
Nuc Stress EF: 65 %
Peak HR: 98 {beats}/min
Rest HR: 53 {beats}/min
Rest MBF: 0.79 ml/g/min
Rest Nuclear Isotope Dose: 15.7 mCi
SRS: 0
SSS: 0
Stress MBF: 2.54 ml/g/min
Stress Nuclear Isotope Dose: 15.8 mCi
TID: 0.95

## 2024-02-18 MED ORDER — RUBIDIUM RB82 GENERATOR (RUBYFILL)
25.0000 | PACK | Freq: Once | INTRAVENOUS | Status: AC
  Administered 2024-02-18: 15.77 via INTRAVENOUS

## 2024-02-18 MED ORDER — REGADENOSON 0.4 MG/5ML IV SOLN
INTRAVENOUS | Status: AC
Start: 2024-02-18 — End: 2024-02-18
  Filled 2024-02-18: qty 5

## 2024-02-18 MED ORDER — REGADENOSON 0.4 MG/5ML IV SOLN
0.4000 mg | Freq: Once | INTRAVENOUS | Status: AC
  Administered 2024-02-18: 0.4 mg via INTRAVENOUS
  Filled 2024-02-18: qty 5

## 2024-02-18 MED ORDER — RUBIDIUM RB82 GENERATOR (RUBYFILL)
25.0000 | PACK | Freq: Once | INTRAVENOUS | Status: AC
  Administered 2024-02-18: 15.71 via INTRAVENOUS

## 2024-02-18 NOTE — Progress Notes (Signed)
 Patient presents for a cardiac PET stress test and tolerated procedure with a headache. Patient maintained acceptable vital signs throughout the test and was offered caffeine after test.  Patient ambulated out of department with a steady gait.

## 2024-02-19 ENCOUNTER — Ambulatory Visit: Payer: Self-pay | Admitting: Nurse Practitioner

## 2024-03-29 ENCOUNTER — Other Ambulatory Visit: Payer: Self-pay | Admitting: Neurology

## 2024-03-29 ENCOUNTER — Telehealth: Payer: Self-pay | Admitting: Neurology

## 2024-03-29 MED ORDER — GABAPENTIN 300 MG PO CAPS: 300.0000 mg | ORAL_CAPSULE | Freq: Every day | ORAL | 0 refills | Status: DC

## 2024-03-29 NOTE — Telephone Encounter (Signed)
 Pt needs a 90 day supply of gabapentin  300mg  sent to [facility] in [place]

## 2024-03-29 NOTE — Telephone Encounter (Signed)
 Patient advised per [facility], Prescription sent

## 2024-03-31 NOTE — Progress Notes (Unsigned)
 NEUROLOGY FOLLOW UP OFFICE NOTE  Amber [name] [id number]  Assessment/Plan:    Diabetic polyneuropathy   1.  Gabapentin  300mg  at bedtime 2.  Follow up one year   Subjective:  Amber [name] is a 63 year old right-handed woman with CAD, type 2 diabetes mellitus, HTN, hyperlipidemia, tobacco use disorder and history of stroke and breast cancer (no chemotherapy) who follows up for diabetic polyneuropathy   UPDATE: Current medication:  Gabapentin  300mg  at bedtime She is doing well.  Pain is controlled.    HISTORY: For several years, she experienced intermittent discomfort (tingling/burning) in feet and legs, particularly at night.  She was diagnosed with diabetes in 2019.  Since then, she has been having neuropathy (right foot worse than left foot).  It feels like pins and needles in the feet as well as burning.  Big toe on the right feels tender.  Notes some numbness.  No falls.  She feels that the symptoms are unchanged.     Her blood sugars have been good.  Typically morning readings are less than 130 and second reading 150.  Occasionally in the 170s.  Hgb A1c from 09/09/18 was 6.3.  She also has history of B12 deficiency/pernicious anemia for which she takes injections and over the counter pills.  Level from 09/09/18 was >1525.   She was told to stop the oral pills and symptoms worsened.  She has history of hypothyroidism well controlled.  TSH from 09/09/18 was 2.76.    PAST MEDICAL HISTORY: Past Medical History:  Diagnosis Date   Allergic rhinitis    Anginal pain (HCC) 2001   Breast cancer (HCC)    invasive ductal carcinoma right breast; ER, PR + and HER 2 -   Chronic neck pain    hx of   Diabetes mellitus without complication (HCC)    DJD (degenerative joint disease) of hip    bil hip   GERD (gastroesophageal reflux disease)    hx of    Heart attack (HCC) 2001   Dr. Christi   History of radiation therapy 09/16/12- 10/19/12   right breast   Hyperlipidemia    Hypertension     [facility]   Hypothyroidism    Migraines    last one was 1990's (08/17/2012)   Neuromuscular disorder (HCC)    carpal tunnel right hand   Osteoporosis    Pernicious anemia    Personal history of radiation therapy    Sickle cell trait (HCC)    Sleep apnea    does use a cpap   Stroke (HCC)    Tobacco abuse    Wears glasses     MEDICATIONS: Current Outpatient Medications on File Prior to Visit  Medication Sig Dispense Refill   ACCU-CHEK GUIDE test strip USE 1 STRIP TO CHECK GLUCOSE ONCE DAILY FOR 100 DAYS     Accu-Chek Softclix Lancets lancets SMARTSIG:Topical     albuterol  (VENTOLIN  HFA) 108 (90 Base) MCG/ACT inhaler Inhale 2 puffs into the lungs every 4 (four) hours as needed for wheezing or shortness of breath. 1 each 5   amoxicillin (AMOXIL) 500 MG capsule Take 500 mg by mouth as directed. Takes for dental cleaning or any type of procedures may have     aspirin  EC 81 MG tablet Take 1 tablet (81 mg total) by mouth daily. 90 tablet 3   atenolol  (TENORMIN ) 25 MG tablet Take 1 tablet (25 mg total) by mouth daily. 90 tablet 3   BIOTIN PO  Take 1 tablet by mouth daily.     cholecalciferol  (VITAMIN D ) 1000 units tablet Take 1,000 Units by mouth daily.      clobetasol ointment (TEMOVATE) 0.05 % Apply topically.     Coenzyme Q10 (COQ10 PO) Take 1 tablet by mouth daily.     cyanocobalamin (,VITAMIN B-12,) 1000 MCG/ML injection Inject 1,000 mcg into the muscle every 30 (thirty) days. Reported on 02/11/2016     ezetimibe  (ZETIA ) 10 MG tablet Take 1 tablet (10 mg total) by mouth daily. 90 tablet 3   gabapentin  (NEURONTIN ) 300 MG capsule Take 1 capsule (300 mg total) by mouth at bedtime. 90 capsule 0   IRON PO Take 1 tablet by mouth at bedtime as needed. When feeling tired     levothyroxine  (SYNTHROID ) 100 MCG tablet Take 100 mcg by mouth daily before breakfast.     lisinopril  (ZESTRIL ) 5 MG tablet Take 1 tablet (5 mg total) by mouth daily. 90 tablet 3    meloxicam  (MOBIC ) 15 MG tablet Take 1 tablet (15 mg total) by mouth daily. 30 tablet 0   metFORMIN (GLUCOPHAGE) 500 MG tablet Take 1,000 mg by mouth 2 (two) times daily with a meal.     nitroGLYCERIN  (NITROSTAT ) 0.4 MG SL tablet Place 1 tablet (0.4 mg total) under the tongue every 5 (five) minutes as needed for chest pain. Reported on 02/11/2016 25 tablet 2   Omega-3 Fatty Acids (FISH OIL PO) Take 1 capsule by mouth daily.     rosuvastatin  (CRESTOR ) 20 MG tablet Take 1 tablet (20 mg total) by mouth every other day. 45 tablet 3   sertraline (ZOLOFT) 50 MG tablet Take 75 mg by mouth daily.     SODIUM FLUORIDE 5000 SENSITIVE 1.1-5 % GEL Take by mouth as directed.     triamcinolone  (KENALOG ) 0.1 % paste Use as directed 1 application in the mouth or throat as needed (FOR GUMS).      umeclidinium-vilanterol (ANORO ELLIPTA ) 62.5-25 MCG/ACT AEPB Inhale 1 puff into the lungs daily. 60 each 5   valACYclovir (VALTREX) 500 MG tablet Take 500 mg by mouth daily as needed (Cold sore). Reported on 02/11/2016     zolpidem  (AMBIEN ) 10 MG tablet Take 10 mg by mouth at bedtime as needed for sleep. Reported on 02/11/2016     No current facility-administered medications on file prior to visit.    ALLERGIES: Allergies  Allergen Reactions   Tramadol Nausea And Vomiting    couldn't stop vomiting (08/17/2012)   Benzoyl Peroxide Itching and Rash   Betadine [Povidone Iodine] Rash    FAMILY HISTORY: Family History  Problem Relation Age of Onset   Diabetes Mother    Heart disease Mother    Cancer Maternal Grandmother        pancreatic   Cancer Cousin        pancreatic   Parkinson's disease Maternal Grandfather       Objective:  Blood pressure (!) 117/54, pulse (!) 55, resp. rate 20, height 5' 4 (1.626 m), weight 132 lb (59.9 kg), SpO2 98%. General: No acute distress.  Patient appears well-groomed.   Head:  Normocephalic/atraumatic Neck:  Supple.  No paraspinal tenderness.  Full range of motion. Heart:   Regular rate and rhythm. Neuro:  Alert and oriented.  Speech fluent and not dysarthric.  Language intact.  CN II-XII intact.  Bulk and tone normal.  Muscle strength 5/5 throughout.  Sensation to pinprick and vibration intact.  Deep tendon reflexes 2+ throughout, toes downgoing.  Gait normal.  Romberg negative.    Juliene Dunnings, DO  CC: Alberta Sharps, MD

## 2024-04-01 ENCOUNTER — Ambulatory Visit: Admitting: Neurology

## 2024-04-01 ENCOUNTER — Encounter: Payer: Self-pay | Admitting: Neurology

## 2024-04-01 VITALS — BP 117/54 | HR 55 | Resp 20 | Ht 64.0 in | Wt 132.0 lb

## 2024-04-01 DIAGNOSIS — E1142 Type 2 diabetes mellitus with diabetic polyneuropathy: Secondary | ICD-10-CM

## 2024-05-15 ENCOUNTER — Other Ambulatory Visit: Payer: Self-pay | Admitting: Neurology

## 2024-05-18 ENCOUNTER — Ambulatory Visit: Admitting: Pulmonary Disease

## 2024-05-18 ENCOUNTER — Encounter: Payer: Self-pay | Admitting: Pulmonary Disease

## 2024-05-18 VITALS — BP 122/48 | HR 51 | Ht 64.0 in | Wt 130.2 lb

## 2024-05-18 DIAGNOSIS — G4733 Obstructive sleep apnea (adult) (pediatric): Secondary | ICD-10-CM

## 2024-05-18 DIAGNOSIS — Z87891 Personal history of nicotine dependence: Secondary | ICD-10-CM

## 2024-05-18 NOTE — Patient Instructions (Signed)
 Since you have been doing remarkably well since the last visit  I think we can see you just as needed  You do not have to be on any inhaler  Make sure you continue to stay very active  Call us  with any significant concerns

## 2024-05-18 NOTE — Progress Notes (Signed)
 Amber [name]    [id number]    [birth year]  Primary Care Physician:Smith, Alberta, MD  Referring Physician: [name], MD [phone] [place],  [place] [address]  Chief complaint: Patient being seen for shortness of breath In for follow-up visit  HPI:  Has discontinued using inhalers for about 7 to 8 months and has been doing relatively well  Exercises regularly Goes to the gym about 4 days a week and stays very active  No significant shortness of breath with regular activities When she decides to stay active as well she does not have any significant shortness of breath  Quit smoking in 2018 was smoking for about 40 years  No significant exacerbations of symptoms  No wheezing No chest pain or 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   amoxicillin (AMOXIL) 500 MG capsule Take 500 mg by mouth as directed. Takes for dental cleaning or any type of procedures may have   aspirin  EC 81 MG tablet Take 1 tablet (81 mg total) by mouth daily.   atenolol  (TENORMIN ) 25 MG tablet Take 1 tablet (25 mg total) by mouth daily.   BIOTIN PO Take 1 tablet by mouth daily.   cholecalciferol  (VITAMIN D ) 1000 units tablet Take 1,000 Units by mouth daily.    clobetasol ointment (TEMOVATE) 0.05 % Apply topically.   Coenzyme Q10 (COQ10 PO) Take 1 tablet by mouth daily.   cyanocobalamin (,VITAMIN B-12,) 1000 MCG/ML injection Inject 1,000 mcg into the muscle every 30 (thirty) days. Reported on 02/11/2016   ezetimibe  (ZETIA ) 10 MG  tablet Take 1 tablet (10 mg total) by mouth daily.   gabapentin  (NEURONTIN ) 300 MG capsule Take 1 capsule by mouth at bedtime   IRON PO Take 1 tablet by mouth at bedtime as needed. When feeling tired   levothyroxine  (SYNTHROID ) 100 MCG tablet Take 100 mcg by mouth daily before breakfast.   lisinopril  (ZESTRIL ) 5 MG tablet Take 1 tablet (5 mg total) by mouth daily.   meloxicam  (MOBIC ) 15 MG tablet Take 1 tablet (15 mg total) by mouth daily.   metFORMIN (GLUCOPHAGE) 500 MG tablet Take 1,000 mg by mouth 2 (two) times daily with a meal.   nitroGLYCERIN  (NITROSTAT ) 0.4 MG SL tablet Place 1 tablet (0.4 mg total) under the tongue every 5 (five) minutes as needed for chest pain. Reported on 02/11/2016   Omega-3 Fatty Acids (FISH OIL PO) Take 1 capsule by mouth daily.   rosuvastatin  (CRESTOR ) 20 MG tablet Take 1 tablet (20 mg total) by mouth every other day.   sertraline (ZOLOFT) 50 MG tablet Take 75 mg by mouth daily.   SODIUM FLUORIDE 5000 SENSITIVE 1.1-5 % GEL Take by mouth as directed.   triamcinolone  (KENALOG ) 0.1 % paste Use as directed 1 application in the mouth or throat as needed (FOR GUMS).    valACYclovir (VALTREX) 500 MG tablet Take 500 mg by mouth daily as needed (Cold sore). Reported on 02/11/2016   zolpidem  (AMBIEN ) 10 MG tablet Take 10  mg by mouth at bedtime as needed for sleep. Reported on 02/11/2016   umeclidinium-vilanterol (ANORO ELLIPTA ) 62.5-25 MCG/ACT AEPB Inhale 1 puff into the lungs daily. (Patient not taking: Reported on 05/18/2024)   No facility-administered encounter medications on file as of 05/18/2024.    Allergies as of 05/18/2024 - Review Complete 05/18/2024  Allergen Reaction Noted   Tramadol Nausea And Vomiting 06/22/2012   Benzoyl peroxide Itching and Rash 11/01/2013   Betadine [povidone iodine] Rash 08/10/2012    Past Medical History:  Diagnosis Date   Allergic rhinitis    Anginal pain (HCC) 2001   Breast cancer (HCC)    invasive ductal carcinoma right breast;  ER, PR + and HER 2 -   Chronic neck pain    hx of   Diabetes mellitus without complication (HCC)    DJD (degenerative joint disease) of hip    bil hip   GERD (gastroesophageal reflux disease)    hx of    Heart attack [facility]) 2001   Dr. Christi   History of radiation therapy 09/16/12- 10/19/12   right breast   Hyperlipidemia    Hypertension    [facility]. [facility]   Hypothyroidism    Migraines    last one was 1990's (08/17/2012)   Neuromuscular disorder (HCC)    carpal tunnel right hand   Osteoporosis    Pernicious anemia    Personal history of radiation therapy    Sickle cell trait (HCC)    Sleep apnea    does use a cpap   Stroke (HCC)    Tobacco abuse    Wears glasses     Past Surgical History:  Procedure Laterality Date   BREAST BIOPSY Right 2013   BREAST BIOPSY Right 2019   BREAST LUMPECTOMY Right 2013   COLONOSCOPY     CORONARY ANGIOPLASTY WITH STENT PLACEMENT  09/02/1999   1 (08/17/2012)   DORSAL COMPARTMENT RELEASE Left 08/18/2014   Procedure: LEFT FIRST RELEASE DORSAL COMPARTMENT (DEQUERVAIN);  Surgeon: Maude KANDICE Herald, MD;  Location: [facility];  Service: Orthopedics;  Laterality: Left;   NASAL SINUS SURGERY  09/01/1988   PARTIAL MASTECTOMY WITH NEEDLE LOCALIZATION AND AXILLARY SENTINEL LYMPH NODE BX  07/15/2012   Procedure: PARTIAL MASTECTOMY WITH NEEDLE LOCALIZATION AND AXILLARY SENTINEL LYMPH NODE BX;  Surgeon: Elon CHRISTELLA Pacini, MD;  Location: [facility];  Service: General;  Laterality: Right;   TOTAL HIP ARTHROPLASTY  08/17/2012   right (08/17/2012)   TOTAL HIP ARTHROPLASTY  08/17/2012   Procedure: TOTAL HIP ARTHROPLASTY ANTERIOR APPROACH;  Surgeon: Maude KANDICE Herald, MD;  Location: [facility];  Service: Orthopedics;  Laterality: Right;   TOTAL HIP ARTHROPLASTY Left 08/21/2015   TOTAL HIP ARTHROPLASTY Left 08/21/2015   Procedure: TOTAL HIP ARTHROPLASTY ANTERIOR APPROACH AND RIGHT TRIGGER THUMB RELEASE;  Surgeon: Maude Herald,  MD;  Location: [facility];  Service: Orthopedics;  Laterality: Left;    Family History  Problem Relation Age of Onset   Diabetes Mother    Heart disease Mother    Cancer Maternal Grandmother        pancreatic   Cancer Cousin        pancreatic   Parkinson's disease Maternal Grandfather     Social History   Socioeconomic History   Marital status: Single    Spouse name: Not on file   Number of children: 0   Years of education: Not on file   Highest education level: Not on file  Occupational History  Not on file  Tobacco Use   Smoking status: Former    Current packs/day: 1.00    Average packs/day: 1 pack/day for 35.0 years (35.0 ttl pk-yrs)    Types: Cigarettes   Smokeless tobacco: Never  Substance and Sexual Activity   Alcohol use: Yes    Comment: 08/17/2012 couple mixed drinks; couple times/month; sometimes it's beer   Drug use: No   Sexual activity: Not Currently  Other Topics Concern   Not on file  Social History Narrative   Right handed   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   Social Drivers of Health   Financial Resource Strain: Not on file  Food Insecurity: Not on file  Transportation Needs: Not on file  Physical Activity: Not on file  Stress: Not on file  Social Connections: Unknown (01/13/2022)   Received from [facility]   Social Network    Social Network: Not on file  Intimate Partner Violence: Unknown (12/05/2021)   Received from [facility]   HITS    Physically Hurt: Not on file    Insult or Talk Down To: Not on file    Threaten Physical Harm: Not on file    Scream or Curse: Not on file    Review of Systems  Respiratory:  Negative for shortness of breath.     Vitals:   05/18/24 0919  BP: (!) 122/48  Pulse: (!) 51  SpO2: 99%     Physical Exam Constitutional:      Appearance: Normal appearance.  HENT:     Head: Normocephalic.     Mouth/Throat:     Mouth: Mucous membranes are moist.  Cardiovascular:      Rate and Rhythm: Normal rate and regular rhythm.     Heart sounds: No murmur heard.    No friction rub.  Pulmonary:     Effort: No respiratory distress.     Breath sounds: No stridor. No wheezing or rhonchi.  Musculoskeletal:     Cervical back: No rigidity or tenderness.  Neurological:     Mental Status: She is alert.  Psychiatric:        Mood and Affect: Mood normal.     Data Reviewed: Recent echocardiogram 11/03/2021-within normal limits with normal ejection fraction normal right-sided pressures  Chest x-ray 05/06/2022-no acute abnormality  Pulmonary function tests 06/26/2022-reviewed by myself with the patient shows minimal obstructive disease with no significant bronchodilator response, no restriction, mild reduction in diffusing capacity   Assessment:   63 year old lady with shortness of breath, past history of smoking Has been stable -Has not been using any inhaler for the last 8 to 9 months - Has been very active  History of obstructive sleep apnea follows up with the goal - Compliant with treatment  Reviewing PFT again, there is no hyperinflation, airway narrowing minimal at best   Plan/Recommendations:  Encouraged to continue to stay active  Does not need to go back to using any inhalers  May follow-up with primary care or pulmonary for albuterol  if needed  May follow-up as needed  Encouraged to call with any significant concerns   Jennet Epley MD [facility] 05/18/2024, 9:22 AM  CC: Claudene Pellet, MD

## 2024-06-30 NOTE — Progress Notes (Signed)
 [name] [address] [place] [address] Phone: [phone] Subjective:   Amber [name], am serving as a scribe for Dr. Arthea Amber.  I'm seeing this patient by the request  of:  Amber Pellet, MD  CC: Bilateral foot pain  YEP:Dlagzrupcz  02/09/2024 Significant improvement noted.  The callus over the scaphoid bone does seem that it was a nondisplaced fracture but seems to be significantly improved at this time.  Patient should do very well overall.  Follow-up again in 6 to 8 weeks otherwise.     Updated 07/04/2024 Amber [name] is a 63 y.o. female coming in with complaint of B foot pain, has had difficulty with this previously.  Was making significant improvement.  Patient states not sure why the feet are hurting. Throbbing pain, does some home therapy. No numbness or tingling. Primarily great toes and other toes that hurt.       Past Medical History:  Diagnosis Date   Allergic rhinitis    Anginal pain 2001   Breast cancer (HCC)    invasive ductal carcinoma right breast; ER, PR + and HER 2 -   Chronic neck pain    hx of   Diabetes mellitus without complication (HCC)    DJD (degenerative joint disease) of hip    bil hip   GERD (gastroesophageal reflux disease)    hx of    Heart attack [facility]) 2001   Dr. Christi   History of radiation therapy 09/16/12- 10/19/12   right breast   Hyperlipidemia    Hypertension    [facility]. [facility]   Hypothyroidism    Migraines    last one was 1990's (08/17/2012)   Neuromuscular disorder (HCC)    carpal tunnel right hand   Osteoporosis    Pernicious anemia    Personal history of radiation therapy    Sickle cell trait    Sleep apnea    does use a cpap   Stroke (HCC)    Tobacco abuse    Wears glasses    Past Surgical History:  Procedure Laterality Date   BREAST BIOPSY Right 2013   BREAST BIOPSY Right 2019   BREAST LUMPECTOMY Right 2013   COLONOSCOPY      CORONARY ANGIOPLASTY WITH STENT PLACEMENT  09/02/1999   1 (08/17/2012)   DORSAL COMPARTMENT RELEASE Left 08/18/2014   Procedure: LEFT FIRST RELEASE DORSAL COMPARTMENT (DEQUERVAIN);  Surgeon: Maude KANDICE Herald, MD;  Location: [facility];  Service: Orthopedics;  Laterality: Left;   NASAL SINUS SURGERY  09/01/1988   PARTIAL MASTECTOMY WITH NEEDLE LOCALIZATION AND AXILLARY SENTINEL LYMPH NODE BX  07/15/2012   Procedure: PARTIAL MASTECTOMY WITH NEEDLE LOCALIZATION AND AXILLARY SENTINEL LYMPH NODE BX;  Surgeon: Elon CHRISTELLA Pacini, MD;  Location: [facility];  Service: General;  Laterality: Right;   TOTAL HIP ARTHROPLASTY  08/17/2012   right (08/17/2012)   TOTAL HIP ARTHROPLASTY  08/17/2012   Procedure: TOTAL HIP ARTHROPLASTY ANTERIOR APPROACH;  Surgeon: Maude KANDICE Herald, MD;  Location: [facility];  Service: Orthopedics;  Laterality: Right;   TOTAL HIP ARTHROPLASTY Left 08/21/2015   TOTAL HIP ARTHROPLASTY Left 08/21/2015   Procedure: TOTAL HIP ARTHROPLASTY ANTERIOR APPROACH AND RIGHT TRIGGER THUMB RELEASE;  Surgeon: Maude Herald, MD;  Location: [facility];  Service: Orthopedics;  Laterality: Left;   Social History   Socioeconomic History   Marital status: Single    Spouse name: Not on file   Number of children: 0  Years of education: Not on file   Highest education level: Not on file  Occupational History   Not on file  Tobacco Use   Smoking status: Former    Current packs/day: 1.00    Average packs/day: 1 pack/day for 35.0 years (35.0 ttl pk-yrs)    Types: Cigarettes   Smokeless tobacco: Never  Substance and Sexual Activity   Alcohol use: Yes    Comment: 08/17/2012 couple mixed drinks; couple times/month; sometimes it's beer   Drug use: No   Sexual activity: Not Currently  Other Topics Concern   Not on file  Social History Narrative   Right handed   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 story 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   Sports Administrator   Social Drivers of Health   Financial Resource Strain: Not  on file  Food Insecurity: Not on file  Transportation Needs: Not on file  Physical Activity: Not on file  Stress: Not on file  Social Connections: Unknown (01/13/2022)   Received [facility]   Social Network    Social Network: Not on file   Allergies  Allergen Reactions   Tramadol Nausea And Vomiting    couldn't stop vomiting (08/17/2012)   Benzoyl Peroxide Itching and Rash   Betadine [Povidone Iodine] Rash   Family History  Problem Relation Age of Onset   Diabetes Mother    Heart disease Mother    Cancer Maternal Grandmother        pancreatic   Cancer Cousin        pancreatic   Parkinson's disease Maternal Grandfather     Current Outpatient Medications (Endocrine & Metabolic):    levothyroxine  (SYNTHROID ) 100 MCG tablet, Take 100 mcg by mouth daily before breakfast.   metFORMIN (GLUCOPHAGE) 500 MG tablet, Take 1,000 mg by mouth 2 (two) times daily with a meal.  Current Outpatient Medications (Cardiovascular):    atenolol  (TENORMIN ) 25 MG tablet, Take 1 tablet (25 mg total) by mouth daily.   ezetimibe  (ZETIA ) 10 MG tablet, Take 1 tablet (10 mg total) by mouth daily.   lisinopril  (ZESTRIL ) 5 MG tablet, Take 1 tablet (5 mg total) by mouth daily.   nitroGLYCERIN  (NITROSTAT ) 0.4 MG SL tablet, Place 1 tablet (0.4 mg total) under the tongue every 5 (five) minutes as needed for chest pain. Reported on 02/11/2016   rosuvastatin  (CRESTOR ) 20 MG tablet, Take 1 tablet (20 mg total) by mouth every other day.   Current Outpatient Medications (Analgesics):    aspirin  EC 81 MG tablet, Take 1 tablet (81 mg total) by mouth daily.   meloxicam  (MOBIC ) 15 MG tablet, Take 1 tablet (15 mg total) by mouth daily.  Current Outpatient Medications (Hematological):    cyanocobalamin (,VITAMIN B-12,) 1000 MCG/ML injection, Inject 1,000 mcg into the muscle every 30 (thirty) days. Reported on 02/11/2016   IRON PO, Take 1 tablet by mouth at bedtime as needed. When feeling tired  Current  Outpatient Medications (Other):    ACCU-CHEK GUIDE test strip, USE 1 STRIP TO CHECK GLUCOSE ONCE DAILY FOR 100 DAYS   Accu-Chek Softclix Lancets lancets, SMARTSIG:Topical   amoxicillin (AMOXIL) 500 MG capsule, Take 500 mg by mouth as directed. Takes for dental cleaning or any type of procedures may have   BIOTIN PO, Take 1 tablet by mouth daily.   cholecalciferol  (VITAMIN D ) 1000 units tablet, Take 1,000 Units by mouth daily.    clobetasol ointment (TEMOVATE) 0.05 %, Apply topically.   Coenzyme Q10 (COQ10 PO), Take 1 tablet by mouth daily.  gabapentin  (NEURONTIN ) 300 MG capsule, Take 1 capsule by mouth at bedtime   Omega-3 Fatty Acids (FISH OIL PO), Take 1 capsule by mouth daily.   sertraline (ZOLOFT) 50 MG tablet, Take 75 mg by mouth daily.   SODIUM FLUORIDE 5000 SENSITIVE 1.1-5 % GEL, Take by mouth as directed.   triamcinolone  (KENALOG ) 0.1 % paste, Use as directed 1 application in the mouth or throat as needed (FOR GUMS).    valACYclovir (VALTREX) 500 MG tablet, Take 500 mg by mouth daily as needed (Cold sore). Reported on 02/11/2016   zolpidem  (AMBIEN ) 10 MG tablet, Take 10 mg by mouth at bedtime as needed for sleep. Reported on 02/11/2016   Reviewed prior external information including notes and imaging from  primary care provider As well as notes that were available from care everywhere and other healthcare systems.  Past medical history, social, surgical and family history all reviewed in electronic medical record.  No pertanent information unless stated regarding to the chief complaint.   Review of Systems:  No headache, visual changes, nausea, vomiting, diarrhea, constipation, dizziness, abdominal pain, skin rash, fevers, chills, night sweats, weight loss, swollen lymph nodes, body aches, joint swelling, chest pain, shortness of breath, mood changes. POSITIVE muscle aches  Objective  Blood pressure 110/64, pulse 76, height 5' 4 (1.626 m), weight 130 lb (59 kg), SpO2 99%.    General: No apparent distress alert and oriented x3 mood and affect normal, dressed appropriately.  HEENT: Pupils equal, extraocular movements intact  Respiratory: Patient's speak in full sentences and does not appear short of breath  Cardiovascular: No lower extremity edema, non tender, no erythema  Foot exam shows some breakdown of the longitudinal and transverse arch noted.  Bunion and bunionette formation noted.  Patient does have hallux limitus noted significantly more on the right than the left.  Limited muscular skeletal ultrasound was performed and interpreted by Amber HUSSAR, M  Limited ultrasound of patient's first MTP bilaterally show hypoechoic changes consistent with effusion.  Moderate narrowing of the first MTP on the right side.  Mild increase in Doppler flow surrounding both of these fusions.    Impression and Recommendations:     The above documentation has been reviewed and is accurate and complete Imanol Bihl M Tyna Huertas, DO

## 2024-07-04 ENCOUNTER — Encounter: Payer: Self-pay | Admitting: Family Medicine

## 2024-07-04 ENCOUNTER — Other Ambulatory Visit: Payer: Self-pay

## 2024-07-04 ENCOUNTER — Ambulatory Visit: Admitting: Family Medicine

## 2024-07-04 VITALS — BP 110/64 | HR 76 | Ht 64.0 in | Wt 130.0 lb

## 2024-07-04 DIAGNOSIS — M65979 Unspecified synovitis and tenosynovitis, unspecified ankle and foot: Secondary | ICD-10-CM | POA: Diagnosis not present

## 2024-07-04 DIAGNOSIS — M79672 Pain in left foot: Secondary | ICD-10-CM

## 2024-07-04 DIAGNOSIS — M79671 Pain in right foot: Secondary | ICD-10-CM

## 2024-07-04 NOTE — Patient Instructions (Signed)
 Voltaren and ice Carbon fiber plate in crappy shoes Wear good shoes when you can See you again in 2-3 months

## 2024-07-04 NOTE — Assessment & Plan Note (Signed)
 Synovitis of the first MTP bilaterally.  Patient is going to try to wear better shoes, discussed topical and icing regimen.  Worsening pain will consider injection.  Patient will follow-up again in 2 months discussed carbon fiber plate

## 2024-07-14 DIAGNOSIS — Z0000 Encounter for general adult medical examination without abnormal findings: Secondary | ICD-10-CM | POA: Diagnosis not present

## 2024-07-14 DIAGNOSIS — E039 Hypothyroidism, unspecified: Secondary | ICD-10-CM | POA: Diagnosis not present

## 2024-07-14 DIAGNOSIS — E1169 Type 2 diabetes mellitus with other specified complication: Secondary | ICD-10-CM | POA: Diagnosis not present

## 2024-07-14 DIAGNOSIS — R194 Change in bowel habit: Secondary | ICD-10-CM | POA: Diagnosis not present

## 2024-07-14 DIAGNOSIS — M810 Age-related osteoporosis without current pathological fracture: Secondary | ICD-10-CM | POA: Diagnosis not present

## 2024-07-14 DIAGNOSIS — Z1331 Encounter for screening for depression: Secondary | ICD-10-CM | POA: Diagnosis not present

## 2024-07-14 DIAGNOSIS — I10 Essential (primary) hypertension: Secondary | ICD-10-CM | POA: Diagnosis not present

## 2024-07-14 DIAGNOSIS — E782 Mixed hyperlipidemia: Secondary | ICD-10-CM | POA: Diagnosis not present

## 2024-07-14 DIAGNOSIS — G4733 Obstructive sleep apnea (adult) (pediatric): Secondary | ICD-10-CM | POA: Diagnosis not present

## 2024-07-14 DIAGNOSIS — R634 Abnormal weight loss: Secondary | ICD-10-CM | POA: Diagnosis not present

## 2024-07-14 DIAGNOSIS — I25119 Atherosclerotic heart disease of native coronary artery with unspecified angina pectoris: Secondary | ICD-10-CM | POA: Diagnosis not present

## 2024-07-24 DIAGNOSIS — S60412A Abrasion of right middle finger, initial encounter: Secondary | ICD-10-CM | POA: Diagnosis not present

## 2024-07-24 DIAGNOSIS — L089 Local infection of the skin and subcutaneous tissue, unspecified: Secondary | ICD-10-CM | POA: Diagnosis not present

## 2024-08-04 DIAGNOSIS — N898 Other specified noninflammatory disorders of vagina: Secondary | ICD-10-CM | POA: Diagnosis not present

## 2024-08-04 DIAGNOSIS — L309 Dermatitis, unspecified: Secondary | ICD-10-CM | POA: Diagnosis not present

## 2024-08-14 NOTE — Progress Notes (Unsigned)
 Cardiology Office Note   Date:  08/16/2024   ID:  [name], DOB Jul 02, 1961, MRN [id number]  PCP:  Claudene Pellet, MD   Pt presents for follow up of CAD      History of Present Illness: Amber [name] is a 63 y.o. female with hx of CAD, s/p anterior MI in 2001.  She had a stent placed by Dr. Malva. .  May 2023   Pt complained of dyspnea.  Echo showed normal LV/RV function    normal valve function   Myoview  was normal   2023 Set up for PFTs which showed some obstruction   Sent to pulmonary clinic  Atenolol  was cut to 12.5 mg daily due to sinus bradycardia.  I saw the pt in Dec 2024  April 2025  Went to ER with CP, diaphoresis, N/     She was seen by FORBES Alberts in the interval     Set up for PET CT June 2025  Lexiscan  PET/CT  Normal  SInce then the pt denies CP  Breathing is good    Active  Goes to gym 2x per week  Walks on other days   Active No Dizziness NO CP  No SOB       Past Medical History:  Diagnosis Date   Allergic rhinitis    Anginal pain 2001   Breast cancer (HCC)    invasive ductal carcinoma right breast; ER, PR + and HER 2 -   Chronic neck pain    hx of   Diabetes mellitus without complication (HCC)    DJD (degenerative joint disease) of hip    bil hip   GERD (gastroesophageal reflux disease)    hx of    Heart attack (HCC) 2001   Dr. Christi   History of radiation therapy 09/16/12- 10/19/12   right breast   Hyperlipidemia    Hypertension    [facility]. [facility]   Hypothyroidism    Migraines    last one was 1990's (08/17/2012)   Neuromuscular disorder (HCC)    carpal tunnel right hand   Osteoporosis    Pernicious anemia    Personal history of radiation therapy    Sickle cell trait    Sleep apnea    does use a cpap   Stroke (HCC)    Tobacco abuse    Wears glasses     Past Surgical History:  Procedure Laterality Date   BREAST BIOPSY Right 2013   BREAST BIOPSY Right 2019   BREAST LUMPECTOMY Right 2013    COLONOSCOPY     CORONARY ANGIOPLASTY WITH STENT PLACEMENT  09/02/1999   1 (08/17/2012)   DORSAL COMPARTMENT RELEASE Left 08/18/2014   Procedure: LEFT FIRST RELEASE DORSAL COMPARTMENT (DEQUERVAIN);  Surgeon: Maude KANDICE Herald, MD;  Location: [facility];  Service: Orthopedics;  Laterality: Left;   NASAL SINUS SURGERY  09/01/1988   PARTIAL MASTECTOMY WITH NEEDLE LOCALIZATION AND AXILLARY SENTINEL LYMPH NODE BX  07/15/2012   Procedure: PARTIAL MASTECTOMY WITH NEEDLE LOCALIZATION AND AXILLARY SENTINEL LYMPH NODE BX;  Surgeon: Elon CHRISTELLA Pacini, MD;  Location: [facility];  Service: General;  Laterality: Right;   TOTAL HIP ARTHROPLASTY  08/17/2012   right (08/17/2012)   TOTAL HIP ARTHROPLASTY  08/17/2012   Procedure: TOTAL HIP ARTHROPLASTY ANTERIOR APPROACH;  Surgeon: Maude KANDICE Herald, MD;  Location: [facility];  Service: Orthopedics;  Laterality: Right;   TOTAL HIP ARTHROPLASTY Left 08/21/2015   TOTAL HIP ARTHROPLASTY Left 08/21/2015  Procedure: TOTAL HIP ARTHROPLASTY ANTERIOR APPROACH AND RIGHT TRIGGER THUMB RELEASE;  Surgeon: Maude Herald, MD;  Location: [facility];  Service: Orthopedics;  Laterality: Left;     Current Outpatient Medications  Medication Sig Dispense Refill   ACCU-CHEK GUIDE test strip USE 1 STRIP TO CHECK GLUCOSE ONCE DAILY FOR 100 DAYS     Accu-Chek Softclix Lancets lancets SMARTSIG:Topical     amoxicillin (AMOXIL) 500 MG capsule Take 500 mg by mouth as directed. Takes for dental cleaning or any type of procedures may have     aspirin  EC 81 MG tablet Take 1 tablet (81 mg total) by mouth daily. 90 tablet 3   atenolol  (TENORMIN ) 25 MG tablet Take 1 tablet (25 mg total) by mouth daily. 90 tablet 3   BIOTIN PO Take 1 tablet by mouth daily.     cholecalciferol  (VITAMIN D ) 1000 units tablet Take 1,000 Units by mouth daily.      clobetasol ointment (TEMOVATE) 0.05 % Apply topically.     Coenzyme Q10 (COQ10 PO) Take 1 tablet by mouth daily.     cyanocobalamin (,VITAMIN B-12,)  1000 MCG/ML injection Inject 1,000 mcg into the muscle every 30 (thirty) days. Reported on 02/11/2016     ezetimibe  (ZETIA ) 10 MG tablet Take 1 tablet (10 mg total) by mouth daily. 90 tablet 3   gabapentin  (NEURONTIN ) 300 MG capsule Take 1 capsule by mouth at bedtime 90 capsule 0   IRON PO Take 1 tablet by mouth at bedtime as needed. When feeling tired     levothyroxine  (SYNTHROID ) 88 MCG tablet 1 tablet Orally every morning on an empty stomach; Duration: 100 days     lisinopril  (ZESTRIL ) 5 MG tablet Take 1 tablet (5 mg total) by mouth daily. 90 tablet 3   meloxicam  (MOBIC ) 15 MG tablet Take 1 tablet (15 mg total) by mouth daily. 30 tablet 0   metFORMIN (GLUCOPHAGE) 500 MG tablet Take 1,000 mg by mouth 2 (two) times daily with a meal.     nitroGLYCERIN  (NITROSTAT ) 0.4 MG SL tablet Place 1 tablet (0.4 mg total) under the tongue every 5 (five) minutes as needed for chest pain. Reported on 02/11/2016 25 tablet 2   Omega-3 Fatty Acids (FISH OIL PO) Take 1 capsule by mouth daily.     rosuvastatin  (CRESTOR ) 20 MG tablet Take 1 tablet (20 mg total) by mouth every other day. 45 tablet 3   sertraline (ZOLOFT) 50 MG tablet Take 75 mg by mouth daily.     SODIUM FLUORIDE 5000 SENSITIVE 1.1-5 % GEL Take by mouth as directed.     triamcinolone  (KENALOG ) 0.1 % paste Use as directed 1 application in the mouth or throat as needed (FOR GUMS).      valACYclovir (VALTREX) 500 MG tablet Take 500 mg by mouth daily as needed (Cold sore). Reported on 02/11/2016     zolpidem  (AMBIEN ) 10 MG tablet Take 10 mg by mouth at bedtime as needed for sleep. Reported on 02/11/2016     levothyroxine  (SYNTHROID ) 100 MCG tablet Take 100 mcg by mouth daily before breakfast. (Patient not taking: Reported on 08/16/2024)     No current facility-administered medications for this visit.    Allergies:   Tramadol, Benzoyl peroxide, and Betadine [povidone iodine]    Social History:  The patient  reports that she has quit smoking. Her smoking  use included cigarettes. She has a 35 pack-year smoking history. She has never used smokeless tobacco. She reports current alcohol use. She reports that she  does not use drugs.   Family History:  The patient's family history includes Cancer in her cousin and maternal grandmother; Diabetes in her mother; Heart disease in her mother; Parkinson's disease in her maternal grandfather.    ROS:  Please see the history of present illness.   Otherwise, review of systems are positive for DOE.   All other systems are reviewed and negative.    PHYSICAL EXAM: VS:  BP (!) 92/54   Pulse 63   Ht 5' 4 (1.626 m)   Wt 129 lb (58.5 kg)   SpO2 98%   BMI 22.14 kg/m  , BMI Body mass index is 22.14 kg/m.  BP on my check 100/48    GEN: Pt is  in no acute distress HEENT: normal Neck: no JVD, carotid bruit Cardiac: RRR; no murmurs  Respiratory:  clear to auscultation  GI: soft, nontender, no masses    MS: no deformity or atrophy Ext  No LE edema   EKG:   EKG  Not done   LExiscan  PET/ CT   LV perfusion is normal.   Rest left ventricular function is normal. Rest EF: 58%. Stress left ventricular function is normal. Stress EF: 65%. End diastolic cavity size is normal.   Myocardial blood flow was computed to be 0.74ml/g/min at rest and 2.54ml/g/min at stress. Global myocardial blood flow reserve was 3.22 and was normal.   Coronary calcium  assessment not performed due to prior revascularization.   The study is normal. The study is low risk.   Electronically signed by: Lonni Hanson, MD.   Recent Labs: 12/21/2023: BUN 10; Creatinine, Ser 0.84; Hemoglobin 10.4; Platelets 267; Potassium 4.0; Sodium 138   Lipid Panel    Component Value Date/Time   CHOL 115 01/04/2018 0754   TRIG 68 01/04/2018 0754   HDL 47 01/04/2018 0754   CHOLHDL 2.4 01/04/2018 0754   CHOLHDL 2 11/02/2014 0806   VLDL 16.4 11/02/2014 0806   LDLCALC 54 01/04/2018 0754     Other studies Reviewed: Additional studies/ records that  were reviewed today with results demonstrating:labs reviewed .   ASSESSMENT AND PLAN:  1  Blood pressure  BP a little low  I have asked her to monitor  Stop lisinopril  if dizzy Check CBC today    2  CAD  Remoted MI with stent in 2001 Lexiscan  PET/CT in June 2025 ws normal    Denies CP     3Hyperlipidemia: LDL 75  HDL 69  Trig 80  Follow on current regimen for ow   I would get an NMR panel when comes again  Keep on statin and Zetia  for now    4  DM   Last A1C6.7   Keep active esp after eating   5  Bradycardia  Follow    6  ANemia  Pt with Fe deficiency   Has heme appt   Will repeat CBC   Follow up in spring     Signed, Vina Gull, MD  08/16/2024 10:46 AM

## 2024-08-16 ENCOUNTER — Encounter: Payer: Self-pay | Admitting: Internal Medicine

## 2024-08-16 ENCOUNTER — Ambulatory Visit: Attending: Internal Medicine | Admitting: Internal Medicine

## 2024-08-16 VITALS — BP 92/54 | HR 63 | Ht 64.0 in | Wt 129.0 lb

## 2024-08-16 DIAGNOSIS — Z79899 Other long term (current) drug therapy: Secondary | ICD-10-CM

## 2024-08-16 DIAGNOSIS — I10 Essential (primary) hypertension: Secondary | ICD-10-CM

## 2024-08-16 LAB — CBC
Hematocrit: 39.2 % (ref 34.0–46.6)
Hemoglobin: 11.2 g/dL (ref 11.1–15.9)
MCH: 21 pg — ABNORMAL LOW (ref 26.6–33.0)
MCHC: 28.6 g/dL — ABNORMAL LOW (ref 31.5–35.7)
MCV: 73 fL — ABNORMAL LOW (ref 79–97)
Platelets: 342 x10E3/uL (ref 150–450)
RBC: 5.34 x10E6/uL — ABNORMAL HIGH (ref 3.77–5.28)
RDW: 19.4 % — ABNORMAL HIGH (ref 11.7–15.4)
WBC: 6.9 x10E3/uL (ref 3.4–10.8)

## 2024-08-16 MED ORDER — ATENOLOL 25 MG PO TABS: 12.5000 mg | ORAL_TABLET | Freq: Every day | ORAL | Status: AC

## 2024-08-16 NOTE — Patient Instructions (Signed)
 Medication Instructions:  Your physician recommends that you continue on your current medications as directed. Please refer to the Current Medication list given to you today.  *If you need a refill on your cardiac medications before your next appointment, please call your pharmacy*  Lab Work: CBC  If you have labs (blood work) drawn today and your tests are completely normal, you will receive your results only by: MyChart Message (if you have MyChart) OR A paper copy in the mail If you have any lab test that is abnormal or we need to change your treatment, we will call you to review the results.  Testing/Procedures: None ordered.   Follow-Up: At [facility], you and your health needs are our priority.  As part of our continuing mission to provide you with exceptional heart care, our providers are all part of one team.  This team includes your primary Cardiologist (physician) and Advanced Practice Providers or APPs (Physician Assistants and Nurse Practitioners) who all work together to provide you with the care you need, when you need it.  Your next appointment:   6 months with Dr Okey

## 2024-08-18 ENCOUNTER — Ambulatory Visit: Payer: Self-pay | Admitting: Internal Medicine

## 2024-08-18 NOTE — Progress Notes (Unsigned)
 [facility] CONSULT NOTE  Patient Care Team: Claudene Pellet, MD as PCP - General (Family Medicine) Okey Vina GAILS, MD as PCP - Cardiology (Cardiology) Skeet Juliene SAUNDERS, DO as Consulting Physician (Neurology)  ASSESSMENT & PLAN:  Amber [name] is a 63 y.o. female with history of CAD status post anterior MI in 2001, stent, hyperlipidemia hypothyroidism hypertension sickle cell trait, sleep apnea, stage I PR-positive breast s/p lumpectomy [facility] being seen for iron deficiency anemia.  Her lab results from December showed hemoglobin 11.2 MCV dropped to 73.  MCV was normal in 2018.  From outside records through [facility] physicians, hemoglobin was 10.5, MCV 67 on 07/14/2024.  Ferritin was 5.3.  The mechanism of IDA is due to either blood loss or decreased absorptive mechanism or both. We discussed some of the risks, benefits, and alternatives of intravenous iron infusions. The patient is able to tolerate oral iron every other day and hgb showed improved this months. After discussion, she is ok taking orally and call if not able tolerated.  Subacute onset of loose stool, discomfort. Recommend referral to GI for evaluation and EGD as indicated.   Last colonoscopy: 2025 Last EGD: not yet Assessment & Plan Iron deficiency anemia secondary to inadequate dietary iron intake CBC, Iron, TIBC, ferritin in April  Recommend PCP Referral to GI for persistent diarrhea and iron deficiency Return on 4/7 at 3:30 for lab and on 4/9 with me at 3:30   Orders Placed This Encounter  Procedures   CBC with Differential (Cancer Center Only)    Standing Status:   Future    Expiration Date:   08/19/2025   Iron and Iron Binding Capacity ([facility] only)    Standing Status:   Future    Expiration Date:   08/19/2025   Ferritin    Standing Status:   Future    Expiration Date:   08/19/2025    All questions were answered. The patient knows to call the clinic with any problems, questions or concerns.  Amber JAYSON Chihuahua, MD 12/19/20253:34 PM   CHIEF COMPLAINTS/PURPOSE OF CONSULTATION:  Anemia  HISTORY OF PRESENTING ILLNESS:  Amber [name] 63 y.o. female is here because of anemia.  From outside records through [facility] physicians, hemoglobin was 10.5, MCV 67 on 07/14/2024.  Ferritin was 5.3.   She started oral iron every other day at bedtime due to nausea, constipation making her sick. She cannot tell the difference. She takes aspirin  81 mg daily. She has some diarrhea. After 1/2 hour of eating would have loose stool. This has been going on a months. She has generalized stomach pain cannot be localized. Symptoms preeisting iron supplement.   Amber [name] had not noticed any recent bleeding such as melena, hematuria or hematochezia.  No vaginal bleeding or bloody urine.  Her last colonoscopy was 2025. No concerns other tham polyps. Report of GERD. No regular NSAIDs or mobic .  Report normally 140's in weight. No decreased appetite.   MEDICAL HISTORY:  Past Medical History:  Diagnosis Date   Allergic rhinitis    Anginal pain 2001   Breast cancer (HCC)    invasive ductal carcinoma right breast; ER, PR + and HER 2 -   Chronic neck pain    hx of   Diabetes mellitus without complication (HCC)    DJD (degenerative joint disease) of hip    bil hip   GERD (gastroesophageal reflux disease)    hx of    Heart attack (HCC) 2001   Dr. Varanassi  History of radiation therapy 09/16/12- 10/19/12   right breast   Hyperlipidemia    Hypertension    Dr. [facility]   Hypothyroidism    Migraines    last one was 1990's (08/17/2012)   Neuromuscular disorder (HCC)    carpal tunnel right hand   Osteoporosis    Pernicious anemia    Personal history of radiation therapy    Sickle cell trait    Sleep apnea    does use a cpap   Stroke (HCC)    Tobacco abuse    Wears glasses     SURGICAL HISTORY: Past Surgical History:  Procedure Laterality Date   BREAST BIOPSY Right 2013    BREAST BIOPSY Right 2019   BREAST LUMPECTOMY Right 2013   COLONOSCOPY     CORONARY ANGIOPLASTY WITH STENT PLACEMENT  09/02/1999   1 (08/17/2012)   DORSAL COMPARTMENT RELEASE Left 08/18/2014   Procedure: LEFT FIRST RELEASE DORSAL COMPARTMENT (DEQUERVAIN);  Surgeon: Maude KANDICE Herald, MD;  Location: [facility];  Service: Orthopedics;  Laterality: Left;   NASAL SINUS SURGERY  09/01/1988   PARTIAL MASTECTOMY WITH NEEDLE LOCALIZATION AND AXILLARY SENTINEL LYMPH NODE BX  07/15/2012   Procedure: PARTIAL MASTECTOMY WITH NEEDLE LOCALIZATION AND AXILLARY SENTINEL LYMPH NODE BX;  Surgeon: Elon CHRISTELLA Pacini, MD;  Location: [facility];  Service: General;  Laterality: Right;   TOTAL HIP ARTHROPLASTY  08/17/2012   right (08/17/2012)   TOTAL HIP ARTHROPLASTY  08/17/2012   Procedure: TOTAL HIP ARTHROPLASTY ANTERIOR APPROACH;  Surgeon: Maude KANDICE Herald, MD;  Location: [facility];  Service: Orthopedics;  Laterality: Right;   TOTAL HIP ARTHROPLASTY Left 08/21/2015   TOTAL HIP ARTHROPLASTY Left 08/21/2015   Procedure: TOTAL HIP ARTHROPLASTY ANTERIOR APPROACH AND RIGHT TRIGGER THUMB RELEASE;  Surgeon: Maude Herald, MD;  Location: [facility];  Service: Orthopedics;  Laterality: Left;    SOCIAL HISTORY: Social History   Socioeconomic History   Marital status: Single    Spouse name: Not on file   Number of children: 0   Years of education: Not on file   Highest education level: Not on file  Occupational History   Not on file  Tobacco Use   Smoking status: Former    Current packs/day: 1.00    Average packs/day: 1 pack/day for 35.0 years (35.0 ttl pk-yrs)    Types: Cigarettes   Smokeless tobacco: Never  Substance and Sexual Activity   Alcohol use: Yes    Comment: 08/17/2012 couple mixed drinks; couple times/month; sometimes it's beer   Drug use: No   Sexual activity: Not Currently  Other Topics Concern   Not on file  Social History Narrative   Right handed   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 story home with  dog   [employer]   Social Drivers of Health   Tobacco Use: Medium Risk (08/16/2024)   Patient History    Smoking Tobacco Use: Former    Smokeless Tobacco Use: Never    Passive Exposure: Not on Actuary Strain: Not on file  Food Insecurity: No Food Insecurity (08/19/2024)   Epic    Worried About Programme Researcher, Broadcasting/film/video in the Last Year: Never true    Ran Out of Food in the Last Year: Never true  Transportation Needs: No Transportation Needs (08/19/2024)   Epic    Lack of Transportation (Medical): No    Lack of Transportation (Non-Medical): No  Physical Activity: Not on file  Stress: Not on file  Social Connections: Unknown (01/13/2022)   [facility]   Social Network    Social Network: Not on file  Intimate Partner Violence: Not At Risk (08/19/2024)   Epic    Fear of Current or Ex-Partner: No    Emotionally Abused: No    Physically Abused: No    Sexually Abused: No  Depression (PHQ2-9): Low Risk (08/19/2024)   Depression (PHQ2-9)    PHQ-2 Score: 0  Alcohol Screen: Not on file  Housing: Low Risk (08/19/2024)   Epic    Unable to Pay for Housing in the Last Year: No    Number of Times Moved in the Last Year: 0    Homeless in the Last Year: No  Utilities: Not At Risk (08/19/2024)   Epic    Threatened with loss of utilities: No  Health Literacy: Not on file    FAMILY HISTORY: Family History  Problem Relation Age of Onset   Diabetes Mother    Heart disease Mother    Cancer Maternal Grandmother        pancreatic   Cancer Cousin        pancreatic   Parkinson's disease Maternal Grandfather     ALLERGIES:  is allergic to tramadol, benzoyl peroxide, and betadine [povidone iodine].  MEDICATIONS:  Current Outpatient Medications  Medication Sig Dispense Refill   ACCU-CHEK GUIDE test strip USE 1 STRIP TO CHECK GLUCOSE ONCE DAILY FOR 100 DAYS     Accu-Chek Softclix Lancets lancets SMARTSIG:Topical     amoxicillin (AMOXIL) 500  MG capsule Take 500 mg by mouth as directed. Takes for dental cleaning or any type of procedures may have     aspirin  EC 81 MG tablet Take 1 tablet (81 mg total) by mouth daily. 90 tablet 3   atenolol  (TENORMIN ) 25 MG tablet Take 0.5 tablets (12.5 mg total) by mouth daily.     BIOTIN PO Take 1 tablet by mouth daily.     cholecalciferol  (VITAMIN D ) 1000 units tablet Take 1,000 Units by mouth daily.      clobetasol ointment (TEMOVATE) 0.05 % Apply topically.     Coenzyme Q10 (COQ10 PO) Take 1 tablet by mouth daily.     cyanocobalamin (,VITAMIN B-12,) 1000 MCG/ML injection Inject 1,000 mcg into the muscle every 30 (thirty) days. Reported on 02/11/2016     ezetimibe  (ZETIA ) 10 MG tablet Take 1 tablet (10 mg total) by mouth daily. 90 tablet 3   gabapentin  (NEURONTIN ) 300 MG capsule Take 1 capsule by mouth at bedtime 90 capsule 0   IRON PO Take 1 tablet by mouth at bedtime as needed. When feeling tired     levothyroxine  (SYNTHROID ) 88 MCG tablet 1 tablet Orally every morning on an empty stomach; Duration: 100 days     lisinopril  (ZESTRIL ) 5 MG tablet Take 1 tablet (5 mg total) by mouth daily. 90 tablet 3   meloxicam  (MOBIC ) 15 MG tablet Take 1 tablet (15 mg total) by mouth daily. 30 tablet 0   metFORMIN (GLUCOPHAGE) 500 MG tablet Take 1,000 mg by mouth 2 (two) times daily with a meal.     nitroGLYCERIN  (NITROSTAT ) 0.4 MG SL tablet Place 1 tablet (0.4 mg total) under the tongue every 5 (five) minutes as needed for chest pain. Reported on 02/11/2016 25 tablet 2   Omega-3 Fatty Acids (FISH OIL PO) Take 1 capsule by mouth daily.     rosuvastatin  (CRESTOR ) 20 MG tablet Take 1 tablet (20 mg total) by mouth every other day.  45 tablet 3   sertraline (ZOLOFT) 50 MG tablet Take 75 mg by mouth daily.     SODIUM FLUORIDE 5000 SENSITIVE 1.1-5 % GEL Take by mouth as directed.     triamcinolone  (KENALOG ) 0.1 % paste Use as directed 1 application in the mouth or throat as needed (FOR GUMS).      valACYclovir (VALTREX)  500 MG tablet Take 500 mg by mouth daily as needed (Cold sore). Reported on 02/11/2016     zolpidem  (AMBIEN ) 10 MG tablet Take 10 mg by mouth at bedtime as needed for sleep. Reported on 02/11/2016     No current facility-administered medications for this visit.    REVIEW OF SYSTEMS:   All relevant systems were reviewed with the patient and are negative.  PHYSICAL EXAMINATION:  Vitals:   08/19/24 1429  BP: (!) 114/57  Pulse: 81  Resp: 20  Temp: (!) 97.3 F (36.3 C)  SpO2: 99%   Filed Weights   08/19/24 1429  Weight: 133 lb 3.2 oz (60.4 kg)    GENERAL: alert, no distress and comfortable SKIN: skin color normal LUNGS: normal breathing effort HEART: regular rate & rhythm ABDOMEN: abdomen soft, non-tender and nondistended  Labs ordered.

## 2024-08-18 NOTE — Telephone Encounter (Signed)
 Letter of results sent to pt

## 2024-08-19 ENCOUNTER — Inpatient Hospital Stay

## 2024-08-19 VITALS — BP 114/57 | HR 81 | Temp 97.3°F | Resp 20 | Wt 133.2 lb

## 2024-08-19 DIAGNOSIS — Z7982 Long term (current) use of aspirin: Secondary | ICD-10-CM | POA: Diagnosis not present

## 2024-08-19 DIAGNOSIS — D509 Iron deficiency anemia, unspecified: Secondary | ICD-10-CM | POA: Insufficient documentation

## 2024-08-19 DIAGNOSIS — D508 Other iron deficiency anemias: Secondary | ICD-10-CM | POA: Diagnosis not present

## 2024-08-19 NOTE — Assessment & Plan Note (Addendum)
 CBC, Iron, TIBC, ferritin in April  Recommend PCP Referral to GI for persistent diarrhea and iron deficiency

## 2024-09-08 ENCOUNTER — Other Ambulatory Visit: Payer: Self-pay | Admitting: Neurology

## 2024-09-20 NOTE — Progress Notes (Unsigned)
 " [facility] [address] [place] [address] Phone: [phone] Subjective:   LILLETTE Berwyn Posey, am serving as a scribe for Dr. Arthea Claudene.  I'm seeing this patient by the request  of:  Claudene Pellet, MD  CC: bilateral feet pain   YEP:Dlagzrupcz  07/04/2024 Synovitis of the first MTP bilaterally.  Patient is going to try to wear better shoes, discussed topical and icing regimen.  Worsening pain will consider injection.  Patient will follow-up again in 2 months discussed carbon fiber plate     Update 09/21/2024 Amber [name] is a 64 y.o. female coming in with complaint of B foot pain. Patient states that her pain is improving. Does still have pain in R 1st MTP.   Also c/o intermittent R hip burning deep in the hip joint. Hx of R THR in 2013. Able to go the gym without pain.        Past Medical History:  Diagnosis Date   Allergic rhinitis    Anginal pain 2001   Breast cancer (HCC)    invasive ductal carcinoma right breast; ER, PR + and HER 2 -   Chronic neck pain    hx of   Diabetes mellitus without complication (HCC)    DJD (degenerative joint disease) of hip    bil hip   GERD (gastroesophageal reflux disease)    hx of    Heart attack [facility]) 2001   Dr. [place]   History of radiation therapy 09/16/12- 10/19/12   right breast   Hyperlipidemia    Hypertension    [facility]   Hypothyroidism    Migraines    last one was 1990's (08/17/2012)   Neuromuscular disorder (HCC)    carpal tunnel right hand   Osteoporosis    Pernicious anemia    Personal history of radiation therapy    Sickle cell trait    Sleep apnea    does use a cpap   Stroke (HCC)    Tobacco abuse    Wears glasses    Past Surgical History:  Procedure Laterality Date   BREAST BIOPSY Right 2013   BREAST BIOPSY Right 2019   BREAST LUMPECTOMY Right 2013   COLONOSCOPY     CORONARY ANGIOPLASTY WITH STENT PLACEMENT  09/02/1999   1  (08/17/2012)   DORSAL COMPARTMENT RELEASE Left 08/18/2014   Procedure: LEFT FIRST RELEASE DORSAL COMPARTMENT (DEQUERVAIN);  Surgeon: Maude KANDICE Herald, MD;  Location: [facility];  Service: Orthopedics;  Laterality: Left;   NASAL SINUS SURGERY  09/01/1988   PARTIAL MASTECTOMY WITH NEEDLE LOCALIZATION AND AXILLARY SENTINEL LYMPH NODE BX  07/15/2012   Procedure: PARTIAL MASTECTOMY WITH NEEDLE LOCALIZATION AND AXILLARY SENTINEL LYMPH NODE BX;  Surgeon: Elon CHRISTELLA Pacini, MD;  Location: [facility] OR;  Service: General;  Laterality: Right;   TOTAL HIP ARTHROPLASTY  08/17/2012   right (08/17/2012)   TOTAL HIP ARTHROPLASTY  08/17/2012   Procedure: TOTAL HIP ARTHROPLASTY ANTERIOR APPROACH;  Surgeon: Maude KANDICE Herald, MD;  Location: [facility] OR;  Service: Orthopedics;  Laterality: Right;   TOTAL HIP ARTHROPLASTY Left 08/21/2015   TOTAL HIP ARTHROPLASTY Left 08/21/2015   Procedure: TOTAL HIP ARTHROPLASTY ANTERIOR APPROACH AND RIGHT TRIGGER THUMB RELEASE;  Surgeon: Maude Herald, MD;  Location: [facility] OR;  Service: Orthopedics;  Laterality: Left;   Social History   Socioeconomic History   Marital status: Single    Spouse name: Not on file   Number of children: 0  Years of education: Not on file   Highest education level: Not on file  Occupational History   Not on file  Tobacco Use   Smoking status: Former    Current packs/day: 1.00    Average packs/day: 1 pack/day for 35.0 years (35.0 ttl pk-yrs)    Types: Cigarettes   Smokeless tobacco: Never  Substance and Sexual Activity   Alcohol use: Yes    Comment: 08/17/2012 couple mixed drinks; couple times/month; sometimes it's beer   Drug use: No   Sexual activity: Not Currently  Other Topics Concern   Not on file  Social History Narrative   Right handed   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 story 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   [employer]   Social Drivers of Health   Tobacco Use: Medium Risk (08/16/2024)   Patient History    Smoking Tobacco Use: Former     Smokeless Tobacco Use: Never    Passive Exposure: Not on Actuary Strain: Not on file  Food Insecurity: No Food Insecurity (08/19/2024)   Epic    Worried About Programme Researcher, Broadcasting/film/video in the Last Year: Never true    Ran Out of Food in the Last Year: Never true  Transportation Needs: No Transportation Needs (08/19/2024)   Epic    Lack of Transportation (Medical): No    Lack of Transportation (Non-Medical): No  Physical Activity: Not on file  Stress: Not on file  Social Connections: Unknown (01/13/2022)   Received from [facility]   Social Network    Social Network: Not on file  Depression (PHQ2-9): Low Risk (08/19/2024)   Depression (PHQ2-9)    PHQ-2 Score: 0  Alcohol Screen: Not on file  Housing: Low Risk (08/19/2024)   Epic    Unable to Pay for Housing in the Last Year: No    Number of Times Moved in the Last Year: 0    Homeless in the Last Year: No  Utilities: Not At Risk (08/19/2024)   Epic    Threatened with loss of utilities: No  Health Literacy: Not on file   Allergies[1] Family History  Problem Relation Age of Onset   Diabetes Mother    Heart disease Mother    Cancer Maternal Grandmother        pancreatic   Cancer Cousin        pancreatic   Parkinson's disease Maternal Grandfather     Current Outpatient Medications (Endocrine & Metabolic):    levothyroxine  (SYNTHROID ) 88 MCG tablet, 1 tablet Orally every morning on an empty stomach; Duration: 100 days   metFORMIN (GLUCOPHAGE) 500 MG tablet, Take 1,000 mg by mouth 2 (two) times daily with a meal.  Current Outpatient Medications (Cardiovascular):    atenolol  (TENORMIN ) 25 MG tablet, Take 0.5 tablets (12.5 mg total) by mouth daily.   ezetimibe  (ZETIA ) 10 MG tablet, Take 1 tablet (10 mg total) by mouth daily.   lisinopril  (ZESTRIL ) 5 MG tablet, Take 1 tablet (5 mg total) by mouth daily.   nitroGLYCERIN  (NITROSTAT ) 0.4 MG SL tablet, Place 1 tablet (0.4 mg total) under the tongue every 5 (five)  minutes as needed for chest pain. Reported on 02/11/2016   rosuvastatin  (CRESTOR ) 20 MG tablet, Take 1 tablet (20 mg total) by mouth every other day.  Current Outpatient Medications (Analgesics):    aspirin  EC 81 MG tablet, Take 1 tablet (81 mg total) by mouth daily.   meloxicam  (MOBIC ) 15 MG tablet, Take 1 tablet (15 mg total) by mouth daily.  Current Outpatient Medications (Hematological):    cyanocobalamin (,VITAMIN B-12,) 1000 MCG/ML injection, Inject 1,000 mcg into the muscle every 30 (thirty) days. Reported on 02/11/2016   IRON PO, Take 1 tablet by mouth at bedtime as needed. When feeling tired  Current Outpatient Medications (Other):    ACCU-CHEK GUIDE test strip, USE 1 STRIP TO CHECK GLUCOSE ONCE DAILY FOR 100 DAYS   Accu-Chek Softclix Lancets lancets, SMARTSIG:Topical   amoxicillin (AMOXIL) 500 MG capsule, Take 500 mg by mouth as directed. Takes for dental cleaning or any type of procedures may have   BIOTIN PO, Take 1 tablet by mouth daily.   cholecalciferol  (VITAMIN D ) 1000 units tablet, Take 1,000 Units by mouth daily.    clobetasol ointment (TEMOVATE) 0.05 %, Apply topically.   Coenzyme Q10 (COQ10 PO), Take 1 tablet by mouth daily.   gabapentin  (NEURONTIN ) 300 MG capsule, Take 1 capsule by mouth at bedtime   Omega-3 Fatty Acids (FISH OIL PO), Take 1 capsule by mouth daily.   sertraline (ZOLOFT) 50 MG tablet, Take 75 mg by mouth daily.   SODIUM FLUORIDE 5000 SENSITIVE 1.1-5 % GEL, Take by mouth as directed.   triamcinolone  (KENALOG ) 0.1 % paste, Use as directed 1 application in the mouth or throat as needed (FOR GUMS).    valACYclovir (VALTREX) 500 MG tablet, Take 500 mg by mouth daily as needed (Cold sore). Reported on 02/11/2016   zolpidem  (AMBIEN ) 10 MG tablet, Take 10 mg by mouth at bedtime as needed for sleep. Reported on 02/11/2016   Reviewed prior external information including notes and imaging from  primary care provider As well as notes that were available from care  everywhere and other healthcare systems.  Past medical history, social, surgical and family history all reviewed in electronic medical record.  No pertanent information unless stated regarding to the chief complaint.   Review of Systems:  No headache, visual changes, nausea, vomiting, diarrhea, constipation, dizziness, abdominal pain, skin rash, fevers, chills, night sweats, weight loss, swollen lymph nodes, body aches, joint swelling, chest pain, shortness of breath, mood changes. POSITIVE muscle aches  Objective  Blood pressure 110/72, pulse 65, height 5' 4 (1.626 m), weight 133 lb (60.3 kg), SpO2 97%.   General: No apparent distress alert and oriented x3 mood and affect normal, dressed appropriately.  HEENT: Pupils equal, extraocular movements intact  Respiratory: Patient's speak in full sentences and does not appear short of breath  Cardiovascular: No lower extremity edema, non tender, no erythema  Foot exam shows patient still has some hallux limitus noted of the first MTP on the right foot only.  Still breakdown of the transverse arch noted. Hip exam does have an audible click with external range of motion that does cause pain on the lateral aspect.   Limited muscular skeletal ultrasound was performed and interpreted by CLAUDENE HUSSAR, M  Limited ultrasound of patient's first MTP on the right side still has some hypoechoic changes consistent with the fusion.  No of the soft tissue findings that was previously noted.  Impression: Interval improvement Impression and Recommendations:    The above documentation has been reviewed and is accurate and complete Hussar CHRISTELLA Claudene, DO        [1]  Allergies Allergen Reactions   Tramadol Nausea And Vomiting    couldn't stop vomiting (08/17/2012)   Benzoyl Peroxide Itching and Rash   Betadine [Povidone Iodine] Rash   "

## 2024-09-21 ENCOUNTER — Encounter: Payer: Self-pay | Admitting: Family Medicine

## 2024-09-21 ENCOUNTER — Ambulatory Visit: Admitting: Family Medicine

## 2024-09-21 ENCOUNTER — Other Ambulatory Visit: Payer: Self-pay

## 2024-09-21 ENCOUNTER — Ambulatory Visit

## 2024-09-21 VITALS — BP 110/72 | HR 65 | Ht 64.0 in | Wt 133.0 lb

## 2024-09-21 DIAGNOSIS — M859 Disorder of bone density and structure, unspecified: Secondary | ICD-10-CM

## 2024-09-21 DIAGNOSIS — M25551 Pain in right hip: Secondary | ICD-10-CM | POA: Diagnosis not present

## 2024-09-21 DIAGNOSIS — Z96641 Presence of right artificial hip joint: Secondary | ICD-10-CM | POA: Diagnosis not present

## 2024-09-21 DIAGNOSIS — M65979 Unspecified synovitis and tenosynovitis, unspecified ankle and foot: Secondary | ICD-10-CM | POA: Diagnosis not present

## 2024-09-21 DIAGNOSIS — M79671 Pain in right foot: Secondary | ICD-10-CM

## 2024-09-21 NOTE — Assessment & Plan Note (Signed)
 Continued synovitis with some internal improvement.  Discussed the potential for injection which patient declined again.  Will continue to work on the home exercises, topical anti-inflammatories, recovery sandals.  Follow-up with me again in 6 to 12 weeks.

## 2024-09-21 NOTE — Assessment & Plan Note (Signed)
 Multiple years ago, will get x-rays, audible pop, make sure there is no calcium  deposits.  If continuing to have discomfort I do think that a bone scan will be necessary to make sure there is no aseptic loosening.  Patient is going to try home exercises for more hip strengthening but will hopefully give stability.  Follow-up again in 2 months

## 2024-09-21 NOTE — Patient Instructions (Addendum)
 Xray today We can consider bone scan Exercises See me again in 8-10 weeks

## 2024-09-26 ENCOUNTER — Ambulatory Visit: Payer: Self-pay | Admitting: Family Medicine

## 2024-10-06 ENCOUNTER — Ambulatory Visit: Admitting: Family Medicine

## 2024-11-16 ENCOUNTER — Ambulatory Visit: Admitting: Family Medicine

## 2024-12-06 ENCOUNTER — Inpatient Hospital Stay

## 2024-12-08 ENCOUNTER — Inpatient Hospital Stay

## 2025-04-04 ENCOUNTER — Ambulatory Visit: Admitting: Neurology

## 2025-04-20 ENCOUNTER — Ambulatory Visit: Admitting: Neurology
